# Patient Record
Sex: Male | Born: 1945 | Race: Black or African American | Hispanic: No | Marital: Married | Smoking: Former smoker
Health system: Southern US, Community
[De-identification: ages and names within clinical notes are randomized; demographics above are authoritative.]

## PROBLEM LIST (undated history)

## (undated) DIAGNOSIS — Z87438 Personal history of other diseases of male genital organs: Secondary | ICD-10-CM

## (undated) DIAGNOSIS — E785 Hyperlipidemia, unspecified: Secondary | ICD-10-CM

## (undated) DIAGNOSIS — C801 Malignant (primary) neoplasm, unspecified: Secondary | ICD-10-CM

## (undated) DIAGNOSIS — K219 Gastro-esophageal reflux disease without esophagitis: Secondary | ICD-10-CM

## (undated) DIAGNOSIS — I10 Essential (primary) hypertension: Secondary | ICD-10-CM

## (undated) DIAGNOSIS — N3281 Overactive bladder: Secondary | ICD-10-CM

## (undated) DIAGNOSIS — R7611 Nonspecific reaction to tuberculin skin test without active tuberculosis: Secondary | ICD-10-CM

## (undated) DIAGNOSIS — E876 Hypokalemia: Secondary | ICD-10-CM

## (undated) DIAGNOSIS — G4733 Obstructive sleep apnea (adult) (pediatric): Secondary | ICD-10-CM

## (undated) DIAGNOSIS — K5900 Constipation, unspecified: Secondary | ICD-10-CM

## (undated) DIAGNOSIS — E538 Deficiency of other specified B group vitamins: Secondary | ICD-10-CM

## (undated) DIAGNOSIS — N529 Male erectile dysfunction, unspecified: Secondary | ICD-10-CM

## (undated) HISTORY — DX: Nonspecific reaction to tuberculin skin test without active tuberculosis: R76.11

## (undated) HISTORY — DX: Obstructive sleep apnea (adult) (pediatric): G47.33

## (undated) HISTORY — DX: Personal history of other diseases of male genital organs: Z87.438

## (undated) HISTORY — DX: Constipation, unspecified: K59.00

## (undated) HISTORY — DX: Essential (primary) hypertension: I10

## (undated) HISTORY — DX: Male erectile dysfunction, unspecified: N52.9

## (undated) HISTORY — DX: Hyperlipidemia, unspecified: E78.5

## (undated) HISTORY — DX: Hypokalemia: E87.6

## (undated) HISTORY — DX: Gastro-esophageal reflux disease without esophagitis: K21.9

## (undated) HISTORY — DX: Overactive bladder: N32.81

## (undated) HISTORY — DX: Deficiency of other specified B group vitamins: E53.8

---

## 2005-01-26 HISTORY — PX: LAMINECTOMY AND MICRODISCECTOMY LUMBAR SPINE: SHX1913

## 2017-11-11 ENCOUNTER — Encounter: Payer: Self-pay | Admitting: Physical Therapy

## 2017-11-11 ENCOUNTER — Ambulatory Visit: Payer: No Typology Code available for payment source | Attending: Family Medicine | Admitting: Physical Therapy

## 2017-11-11 ENCOUNTER — Other Ambulatory Visit: Payer: Self-pay

## 2017-11-11 DIAGNOSIS — M21372 Foot drop, left foot: Secondary | ICD-10-CM | POA: Insufficient documentation

## 2017-11-11 DIAGNOSIS — R2689 Other abnormalities of gait and mobility: Secondary | ICD-10-CM | POA: Diagnosis present

## 2017-11-11 DIAGNOSIS — M25552 Pain in left hip: Secondary | ICD-10-CM | POA: Insufficient documentation

## 2017-11-11 DIAGNOSIS — M6281 Muscle weakness (generalized): Secondary | ICD-10-CM | POA: Insufficient documentation

## 2017-11-11 NOTE — Therapy (Signed)
[facility] [address] [place], [place], [address] Phone: [phone]   Fax:  [phone]  Physical Therapy Evaluation  Patient Details  Name: Grant Taylor MRN: [id number] Date of Birth: [birth year] Referring Provider (PT): [name]   Encounter Date: 11/11/2017  PT End of Session - 11/11/17 1117    Visit Number  1    Number of Visits  15    Date for PT Re-Evaluation  01/06/18    Authorization Type  [facility] approved 15 visits max    PT Start Time  4128    PT Stop Time  1215    PT Time Calculation (min)  57 min    Activity Tolerance  Patient tolerated treatment well       History reviewed. No pertinent past medical history.  History reviewed. No pertinent surgical history.  There were no vitals filed for this visit.   Subjective Assessment - 11/11/17 1115    Subjective  Orignial injury in [place].  Cervical myelopathy/spinal cord compression in spine C3-C7 causing LE weakness left > right.  12 years of knee jerking back causing hip, knee and toe pain.  Tried knee brace at the [place] that extended to the foot.  Tried different brace none of them worked.  Tried [employer] but all too painful and rubbed.  Tried 5 different braces.   All too uncomfortable.   Bought knee and ankle braces at [employer].   Using forearm crutch for the last 2 months since ankle kept rolling over.  Previously using a cane.  My knee doesn;t hurt it just doesn't work right.   Both of my hips hurt now.      Pertinent History  Cervical myelopathy with fusion; DDD; PTSD; HTN; LBP; left hip pain ;  Hammer toes contracted.      Limitations  Walking;Standing    How long can you sit comfortably?  no problem     How long can you stand comfortably?  10-15 min from myelopathy    How long can you walk comfortably?  25 yards     Diagnostic tests  arthritis    Patient Stated Goals  get rid of the cane and walk normally     Currently in Pain?  Yes    Pain Score  0-No pain    Pain Location  Hip    Pain Orientation  Left;Right    Pain Type  Chronic pain    Pain Frequency  Constant    Aggravating Factors   walking, standing;  have to use my hands to move leg in/out of the car;  difficulty pushing in clutch in car;  negotiating curb at his apartment complex         [facility] PT Assessment - 11/11/17 0001      Assessment   Medical Diagnosis  left knee pain    Referring Provider (PT)  [name]    Onset Date/Surgical Date  --   worsening in last few months   Next MD Visit  not scheduled     [facility]  [facility];  had acupunture in past no benefit;  has home TENS no benefit states he has burned himself on previous occassions      Precautions   Precautions  Fall      Restrictions   Weight Bearing Restrictions  No      Balance Screen   Has the patient fallen in the past 6 months  No  Has the patient had a decrease in activity level because of a fear of falling?   Yes    Is the patient reluctant to leave their home because of a fear of falling?   No      Home Environment   Living Environment  Private residence    Type of [place] - single point;Other (comment)    Additional Comments  forearm crutch    electric cart in the store     Prior Function   Vocation  Retired    Leisure  watching channels      Observation/Other Assessments   Focus on Therapeutic Outcomes (FOTO)   NA      AROM   Right Hip Extension  0    Right Hip Flexion  90    Right Hip ABduction  10    Left Hip Extension  0    Left Hip Flexion  80    Right Knee Extension  0    Right Knee Flexion  120    Left Knee Extension  0    Left Knee Flexion  87    Right Ankle Dorsiflexion  5    Right Ankle Plantar Flexion  30    Right Ankle Inversion  10    Right Ankle Eversion  10    Left Ankle Dorsiflexion  -10    Left Ankle Plantar Flexion  50    Left  Ankle Inversion  5    Left Ankle Eversion  0      Strength   Right Hip Flexion  4/5    Right Hip Extension  4/5    Right Hip External Rotation   4/5    Right Hip Internal Rotation  4/5    Right Hip ABduction  4-/5    Right Hip ADduction  4/5    Left Hip Flexion  3/5    Left Hip Extension  2/5    Left Hip External Rotation  3/5    Right Knee Flexion  4/5    Right Knee Extension  4/5    Left Knee Flexion  3+/5    Left Knee Extension  3+/5    Right Ankle Dorsiflexion  4/5    Right Ankle Plantar Flexion  4/5    Right Ankle Inversion  4/5    Right Ankle Eversion  4/5    Left Ankle Dorsiflexion  0/5    Left Ankle Plantar Flexion  2-/5    Left Ankle Inversion  2-/5    Left Ankle Eversion  0/5      Flexibility   Soft Tissue Assessment /Muscle Length  --   decreased left gastroc length      Ambulation/Gait   Assistive device  R Forearm Crutch    Gait Pattern  Trendelenburg;Lateral hip instability;Trunk flexed;Poor foot clearance - left    Gait Comments  patient has to flex left hip excessively high to clear left foot      6 Minute Walk- Baseline   6 Minute Walk- Baseline  --   to be assess next visit     Standardized Balance Assessment   Five times sit to stand comments   needs max assist with UEs to rise from standard chair       Timed Up and Go Test   TUG Comments  to be assessed next visit  Objective measurements completed on examination: See above findings.              PT Education - 11/11/17 1218    Education Details   Access Code: [email or web address]   pillow squeezes,sidelying clams; heel cord stretch     Person(s) Educated  Patient    Methods  Explanation;Handout    Comprehension  Verbalized understanding;Returned demonstration;Verbal cues required       PT Short Term Goals - 11/11/17 1307      PT SHORT TERM GOAL #1   Title  The patient will demonstrate compliance with intial home ex program     Time  4    Period  Weeks    Status   New    Target Date  12/09/17      PT SHORT TERM GOAL #2   Title  The patient will demonstrate good body mechanics with rolling to get in/out of bed to relieve back pain with this activity    Time  4    Period  Weeks    Status  New      PT SHORT TERM GOAL #3   Title  The patient will be able to walk 150 feet with most appropriate assistive device  needed for doctor's appointments and grocery shopping    Time  4    Period  Weeks    Status  New      PT SHORT TERM GOAL #4   Title  Left knee flexion ROM improved to 95 degrees needed for greater ease with sit to stand and negatiating curbs    Time  4    Period  Weeks    Status  New        PT Long Term Goals - 11/11/17 1316      PT LONG TERM GOAL #1   Title  The patient will be independent with safe self progression of HEP needed for further strengthening     Time  8    Period  Weeks    Status  New      PT LONG TERM GOAL #2   Title  The patient will report back and hip pain improved by 30% with usual ADLs    Time  8    Period  Weeks    Status  New      PT LONG TERM GOAL #3   Title  The patient will be able to ambulate with most appropriate assistive device 300 feet    Time  8    Period  Weeks    Status  New      PT LONG TERM GOAL #4   Title  The patient will have improved left hip strength to 4-/5 needed to lift his leg in/out of the car and for using the clutch     Time  8    Period  Weeks    Status  New      PT LONG TERM GOAL #5   Title  The patient will have improved knee strength to grossly 4/5 with greater ease with sit to stand and negotiating curbs    Time  8    Period  Weeks    Status  New      Additional Long Term Goals   Additional Long Term Goals  Yes      PT LONG TERM GOAL #6   Title  Timed up and go gait speed improved to 20 sec for safety with crossing parking lots  Plan - 11/11/17 1248    Clinical Impression Statement  The patient has a long history of cervical myelopathy  significant spinal cord compression from injury in the [place] War.  Twelve years ago he had 2 cervical surgeries.  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  He also reports a worsening of low back pain, posterior left hip pain and the new onset of right hip pain.  He reports he is limited to 10 min of standing and only 25 yards of walking at a time.  He began using a forearm crutch a few months ago after he repeatedly kept "rolling his ankle."  He previously used a straight cane.  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  He reports he has tried numerous braces (at least 5) at the [place] but "none of them worked."  He saw the podiatrist yesterday and recommended FES (functional electrical stimulation unit) to assist with foot drop.  He has trace activation of left ankle dorsiflexors and evertors.  Decreased left gastroc length on left with barely reaching neutral passively.  Weakness with left hip flexion, abduction and extension as well grossly 3-/5.  He uses his hands to lift left LE in/out of the car.  He needs signficant UE use with rising from a standard chair.   He has no current HEP.  He would benefit from PT to address these deficits.     History and Personal Factors relevant to plan of care:  lack of home support;  numerous co-morbidities including myelopathy;  DDD; arthritis;  PTSD;  HTN; spinal stenosis    Clinical Presentation  Evolving    Clinical Presentation due to:  multi regions and systems affected; worsening of symptoms    Clinical Decision Making  Moderate    Rehab Potential  Fair    PT Frequency  2x / week    PT Duration  8 weeks    PT Treatment/Interventions  ADLs/Self Care Home Management    PT Next Visit Plan  Nu-Step;  check 6 min walk test;  do TUG;  left gastroc stretch; try supine clam;  try whole leg press down;  try rolling walker; sit to stand from very  high surface;  left knee flexion ROM     PT Home Exercise Plan   Access Code: [email or web address]     Recommended Other Services  has home TENS unit    Consulted and Agree with Plan of Care  Patient       Patient will benefit from skilled therapeutic intervention in order to improve the following deficits and impairments:  Abnormal gait, Pain, Decreased activity tolerance, Decreased endurance, Decreased strength, Impaired perceived functional ability, Difficulty walking, Decreased balance, Decreased range of motion  Visit Diagnosis: Muscle weakness (generalized) - Plan: PT plan of care cert/re-cert  Pain in left hip - Plan: PT plan of care cert/re-cert  Foot drop, left - Plan: PT plan of care cert/re-cert  Other abnormalities of gait and mobility - Plan: PT plan of care cert/re-cert     Problem List There are no active problems to display for this patient.  [name], PT 11/11/17 1:29 PM Phone: [phone] Fax: [phone]  [name] 11/11/2017, 1:29 PM  [facility] [address], [place], [place], [address] Phone: [phone]   Fax:  [phone]  Name: Grant Rinks  Taylor MRN: [id number] Date of Birth: 05/10/1945

## 2017-11-11 NOTE — Patient Instructions (Signed)
Access Code: [email or web address]  URL: [email or web address]  Date: 11/11/2017  Prepared by: [name]   Exercises  Seated Gastroc Stretch with Strap - 3 reps - 1 sets - 30 hold - 5x daily - 7x weekly  Supine Isometric Hip Adduction with Pillow at Knees - 10 reps - 1 sets - 1x daily - 7x weekly  Clamshell - 10 reps - 1 sets - 1x daily - 7x weekly

## 2017-11-18 ENCOUNTER — Ambulatory Visit: Payer: No Typology Code available for payment source | Admitting: Physical Therapy

## 2017-11-22 ENCOUNTER — Encounter: Payer: Self-pay | Admitting: Physical Therapy

## 2017-11-22 ENCOUNTER — Ambulatory Visit: Payer: No Typology Code available for payment source | Admitting: Physical Therapy

## 2017-11-22 DIAGNOSIS — M6281 Muscle weakness (generalized): Secondary | ICD-10-CM | POA: Diagnosis not present

## 2017-11-22 DIAGNOSIS — M25552 Pain in left hip: Secondary | ICD-10-CM

## 2017-11-22 DIAGNOSIS — M21372 Foot drop, left foot: Secondary | ICD-10-CM

## 2017-11-22 DIAGNOSIS — R2689 Other abnormalities of gait and mobility: Secondary | ICD-10-CM

## 2017-11-22 NOTE — Therapy (Signed)
[facility] [address], [place], [place], [address] Phone: [phone]   Fax:  [phone]  Physical Therapy Treatment  Patient Details  Name: Grant Taylor MRN: [id number] Date of Birth: Jun 29, 1945 Referring Provider (PT): [name]   Encounter Date: 11/22/2017  PT End of Session - 11/22/17 1351    Visit Number  2    Number of Visits  15    Date for PT Re-Evaluation  01/06/18    Authorization Type  [facility] approved 15 visits max    PT Start Time  2694    PT Stop Time  8546    PT Time Calculation (min)  44 min    Activity Tolerance  Patient tolerated treatment well;No increased pain    Behavior During Therapy  [facility] for tasks assessed/performed       History reviewed. No pertinent past medical history.  History reviewed. No pertinent surgical history.  There were no vitals filed for this visit.  Subjective Assessment - 11/22/17 1236    Subjective  Pt reports that things are going ok. No change since his evaluation.     Pertinent History  Cervical myelopathy with fusion; DDD; PTSD; HTN; LBP; left hip pain ;  Hammer toes contracted.      Limitations  Walking;Standing    How long can you sit comfortably?  no problem     How long can you stand comfortably?  10-15 min from myelopathy    How long can you walk comfortably?  25 yards     Diagnostic tests  arthritis    Patient Stated Goals  get rid of the cane and walk normally     Currently in Pain?  No/denies         [facility] PT Assessment - 11/22/17 0001      6 Minute Walk- Baseline   6 Minute Walk- Baseline  --      6 minute walk test results    Endurance additional comments  182 with 1 forearm crutch                   [facility] Adult PT Treatment/Exercise - 11/22/17 0001      Exercises   Exercises  Knee/Hip      Knee/Hip Exercises: Stretches   Gastroc Stretch  Left;4 reps;20 seconds    Gastroc Stretch Limitations  long sitting with  strap      Knee/Hip Exercises: Aerobic   Nustep  L1 x91min, PT present to discuss orthotics implications       Knee/Hip Exercises: Supine   Other Supine Knee/Hip Exercises  B bent knee fallout x10 reps     Other Supine Knee/Hip Exercises  Lt and Rt hip extension isometric 10x5 sec hold              PT Education - 11/22/17 1350    Education Details  implications for orthotics referral; technique with therex    Person(s) Educated  Patient    Methods  Explanation;Verbal cues    Comprehension  Returned demonstration;Verbalized understanding       PT Short Term Goals - 11/11/17 1307      PT SHORT TERM GOAL #1   Title  The patient will demonstrate compliance with intial home ex program     Time  4    Period  Weeks    Status  New    Target Date  12/09/17      PT SHORT TERM GOAL #2  Title  The patient will demonstrate good body mechanics with rolling to get in/out of bed to relieve back pain with this activity    Time  4    Period  Weeks    Status  New      PT SHORT TERM GOAL #3   Title  The patient will be able to walk 150 feet with most appropriate assistive device  needed for doctor's appointments and grocery shopping    Time  4    Period  Weeks    Status  New      PT SHORT TERM GOAL #4   Title  Left knee flexion ROM improved to 95 degrees needed for greater ease with sit to stand and negatiating curbs    Time  4    Period  Weeks    Status  New        PT Long Term Goals - 11/11/17 1316      PT LONG TERM GOAL #1   Title  The patient will be independent with safe self progression of HEP needed for further strengthening     Time  8    Period  Weeks    Status  New      PT LONG TERM GOAL #2   Title  The patient will report back and hip pain improved by 30% with usual ADLs    Time  8    Period  Weeks    Status  New      PT LONG TERM GOAL #3   Title  The patient will be able to ambulate with most appropriate assistive device 300 feet    Time  8    Period   Weeks    Status  New      PT LONG TERM GOAL #4   Title  The patient will have improved left hip strength to 4-/5 needed to lift his leg in/out of the car and for using the clutch     Time  8    Period  Weeks    Status  New      PT LONG TERM GOAL #5   Title  The patient will have improved knee strength to grossly 4/5 with greater ease with sit to stand and negotiating curbs    Time  8    Period  Weeks    Status  New      Additional Long Term Goals   Additional Long Term Goals  Yes      PT LONG TERM GOAL #6   Title  Timed up and go gait speed improved to 20 sec for safety with crossing parking lots            Plan - 11/22/17 1351    Clinical Impression Statement  Pt's 6 MWT was completed this session due to time constraints during his evaluation. He was able to ambulate 182 feet with his AD and without the need for a rest break. Session focused on therex to improve hip mobility, increase ankle DF ROM and active hip/knee musculature for better control during ambulation. Due to pt's excessive foot drop and genu recurvatum, therapist discussed implications for an orthotics referral. Pt had understanding of this and reported no increase in pain with any exercises/activities completed during today's session.     Rehab Potential  Fair    PT Frequency  2x / week    PT Duration  8 weeks    PT Treatment/Interventions  ADLs/Self Care Home  Management    PT Next Visit Plan  f/u on KAFO/AFO; Nu-Step for endurance as needed; ankle mobilization and hip stretching; try supine clam;  try whole leg press down;  try rolling walker; sit to stand from very high surface;  left knee flexion ROM     PT Home Exercise Plan   Access Code: [email or web address]     Consulted and Agree with Plan of Care  Patient       Patient will benefit from skilled therapeutic intervention in order to improve the following deficits and impairments:  Abnormal gait, Pain, Decreased activity tolerance, Decreased endurance, Decreased  strength, Impaired perceived functional ability, Difficulty walking, Decreased balance, Decreased range of motion  Visit Diagnosis: Muscle weakness (generalized)  Pain in left hip  Foot drop, left  Other abnormalities of gait and mobility     Problem List There are no active problems to display for this patient.   1:57 PM,11/22/17 [place] [place] [facility] [place]  [facility] [address], [place], [place], [address] Phone: [phone]   Fax:  [phone]  Name: Grant Taylor MRN: [id number] Date of Birth: [birth year]

## 2017-11-25 ENCOUNTER — Encounter: Payer: Self-pay | Admitting: Physical Therapy

## 2017-11-25 ENCOUNTER — Ambulatory Visit: Payer: No Typology Code available for payment source | Admitting: Physical Therapy

## 2017-11-25 DIAGNOSIS — R2689 Other abnormalities of gait and mobility: Secondary | ICD-10-CM

## 2017-11-25 DIAGNOSIS — M21372 Foot drop, left foot: Secondary | ICD-10-CM

## 2017-11-25 DIAGNOSIS — M6281 Muscle weakness (generalized): Secondary | ICD-10-CM

## 2017-11-25 DIAGNOSIS — M25552 Pain in left hip: Secondary | ICD-10-CM

## 2017-11-25 NOTE — Patient Instructions (Signed)
Access Code: [email or web address]  URL: [email or web address]  Date: 11/25/2017  Prepared by: Sherol Dade   Exercises  Seated Gastroc Stretch with Strap - 3 reps - 1 sets - 30 hold - 5x daily - 7x weekly  Supine Isometric Hip Adduction with Pillow at Knees - 10 reps - 1 sets - 1x daily - 7x weekly  Clamshell - 10 reps - 1 sets - 1x daily - 7x weekly  Supine Hamstring Stretch with Strap - 10 reps - 3 sets - 1x daily - 7x weekly    [facility] [address], [place], [place] [address] Phone # [phone] Fax [phone]

## 2017-11-25 NOTE — Therapy (Signed)
[facility] [address], [place], [place], [address] Phone: [phone]   Fax:  [phone]  Physical Therapy Treatment  Patient Details  Name: Grant Taylor MRN: [id number] Date of Birth: 08/30/1945 Referring Provider (PT): [name]   Encounter Date: 11/25/2017  PT End of Session - 11/25/17 1448    Visit Number  3    Number of Visits  15    Date for PT Re-Evaluation  01/06/18    Authorization Type  [facility] approved 15 visits max    PT Start Time  9528    PT Stop Time  4132    PT Time Calculation (min)  42 min    Activity Tolerance  Patient tolerated treatment well;No increased pain    Behavior During Therapy  [facility] for tasks assessed/performed       History reviewed. No pertinent past medical history.  History reviewed. No pertinent surgical history.  There were no vitals filed for this visit.  Subjective Assessment - 11/25/17 1408    Subjective  Pt reports that he ordered a stretch strap. His Rt hip is bothering him now because he tried walking to mailbox.    Pertinent History  Cervical myelopathy with fusion; DDD; PTSD; HTN; LBP; left hip pain ;  Hammer toes contracted.      Limitations  Walking;Standing    How long can you sit comfortably?  no problem     How long can you stand comfortably?  10-15 min from myelopathy    How long can you walk comfortably?  25 yards     Diagnostic tests  arthritis    Patient Stated Goals  get rid of the cane and walk normally     Currently in Pain?  Yes    Pain Score  2     Pain Location  Hip    Pain Orientation  Right    Pain Descriptors / Indicators  Aching    Pain Type  Chronic pain    Pain Radiating Towards  none noted     Pain Onset  More than a month ago    Pain Frequency  Constant    Aggravating Factors   alot of weight bearing     Pain Relieving Factors  resting helps     Effect of Pain on Daily Activities  limited walking                         [facility] Adult PT Treatment/Exercise - 11/25/17 0001      Exercises   Exercises  Knee/Hip      Knee/Hip Exercises: Stretches   Passive Hamstring Stretch  Left;3 reps;20 seconds    Passive Hamstring Stretch Limitations  active assisted straight leg raise     Gastroc Stretch  Left;4 reps;20 seconds    Gastroc Stretch Limitations  long sitting with strap    Other Knee/Hip Stretches  Lt ankle eversion stretch 5x20 sec hold       Manual Therapy   Manual Therapy  Myofascial release    Myofascial Release  Lt talocrural AP mobilization grade IV x4 bouts, Grade III-IV medial/lateral subtalar joint mobilization x2 bouts; Lt subtalar distraction x4 bouts              PT Education - 11/25/17 1447    Education Details  technique with therex; updated and reviewed HEP    Person(s) Educated  Patient    Methods  Explanation;Handout;Verbal cues;Demonstration  Comprehension  Verbalized understanding;Returned demonstration       PT Short Term Goals - 11/11/17 1307      PT SHORT TERM GOAL #1   Title  The patient will demonstrate compliance with intial home ex program     Time  4    Period  Weeks    Status  New    Target Date  12/09/17      PT SHORT TERM GOAL #2   Title  The patient will demonstrate good body mechanics with rolling to get in/out of bed to relieve back pain with this activity    Time  4    Period  Weeks    Status  New      PT SHORT TERM GOAL #3   Title  The patient will be able to walk 150 feet with most appropriate assistive device  needed for doctor's appointments and grocery shopping    Time  4    Period  Weeks    Status  New      PT SHORT TERM GOAL #4   Title  Left knee flexion ROM improved to 95 degrees needed for greater ease with sit to stand and negatiating curbs    Time  4    Period  Weeks    Status  New        PT Long Term Goals - 11/11/17 1316      PT LONG TERM GOAL #1   Title  The patient will be independent  with safe self progression of HEP needed for further strengthening     Time  8    Period  Weeks    Status  New      PT LONG TERM GOAL #2   Title  The patient will report back and hip pain improved by 30% with usual ADLs    Time  8    Period  Weeks    Status  New      PT LONG TERM GOAL #3   Title  The patient will be able to ambulate with most appropriate assistive device 300 feet    Time  8    Period  Weeks    Status  New      PT LONG TERM GOAL #4   Title  The patient will have improved left hip strength to 4-/5 needed to lift his leg in/out of the car and for using the clutch     Time  8    Period  Weeks    Status  New      PT LONG TERM GOAL #5   Title  The patient will have improved knee strength to grossly 4/5 with greater ease with sit to stand and negotiating curbs    Time  8    Period  Weeks    Status  New      Additional Long Term Goals   Additional Long Term Goals  Yes      PT LONG TERM GOAL #6   Title  Timed up and go gait speed improved to 20 sec for safety with crossing parking lots            Plan - 11/25/17 1449    Clinical Impression Statement  Pt arrived with his toe support and therapist reviewed this. It appears to have proper fit, however pt has been wearing this 24/7 and it appears to have irritated between his toes. Therapist educated him on the importance of removing the  toe pad throughout the day to prevent excess moisture and skin break down. Also focused on manual treatment to the Lt ankle for improved flexibility and reinforced pt technique with his stretches for home. Pt was able to demonstrate good understanding of HEP updates by the end of today's session.     Rehab Potential  Fair    PT Frequency  2x / week    PT Duration  8 weeks    PT Treatment/Interventions  ADLs/Self Care Home Management    PT Next Visit Plan  f/u on KAFO/AFO if information is provided; Nu-Step for endurance as needed; ankle mobilization and hip stretching; try supine  clam;  try whole leg press down;  try rolling walker; sit to stand from very high surface;  left knee flexion ROM     PT Home Exercise Plan   Access Code: [email or web address]     Consulted and Agree with Plan of Care  Patient       Patient will benefit from skilled therapeutic intervention in order to improve the following deficits and impairments:  Abnormal gait, Pain, Decreased activity tolerance, Decreased endurance, Decreased strength, Impaired perceived functional ability, Difficulty walking, Decreased balance, Decreased range of motion  Visit Diagnosis: Muscle weakness (generalized)  Pain in left hip  Foot drop, left  Other abnormalities of gait and mobility     Problem List There are no active problems to display for this patient.   3:12 PM,11/25/17 Sherol Dade PT, DPT [place] [facility] [address] [place], [place], [address] Phone: [phone]   Fax:  [phone]  Name: Grant Taylor MRN: [id number] Date of Birth: [birth year]

## 2017-11-30 ENCOUNTER — Encounter: Payer: Self-pay | Admitting: Physical Therapy

## 2017-11-30 ENCOUNTER — Ambulatory Visit: Payer: No Typology Code available for payment source | Attending: Family Medicine | Admitting: Physical Therapy

## 2017-11-30 DIAGNOSIS — M6281 Muscle weakness (generalized): Secondary | ICD-10-CM | POA: Diagnosis present

## 2017-11-30 DIAGNOSIS — R2689 Other abnormalities of gait and mobility: Secondary | ICD-10-CM | POA: Insufficient documentation

## 2017-11-30 DIAGNOSIS — M25552 Pain in left hip: Secondary | ICD-10-CM | POA: Insufficient documentation

## 2017-11-30 DIAGNOSIS — M21372 Foot drop, left foot: Secondary | ICD-10-CM | POA: Insufficient documentation

## 2017-11-30 NOTE — Therapy (Signed)
[facility] [address], [place], [place], [address] Phone: [phone]   Fax:  [phone]  Physical Therapy Treatment  Patient Details  Name: Grant Taylor MRN: [id number] Date of Birth: 15-Jun-1945 Referring Provider (PT): [name]   Encounter Date: 11/30/2017  PT End of Session - 11/30/17 1355    Visit Number  4    Number of Visits  15    Date for PT Re-Evaluation  01/06/18    Authorization Type  VA approved 15 visits max    PT Start Time  1230    PT Stop Time  1312    PT Time Calculation (min)  42 min    Activity Tolerance  Patient tolerated treatment well;No increased pain       History reviewed. No pertinent past medical history.  History reviewed. No pertinent surgical history.  There were no vitals filed for this visit.  Subjective Assessment - 11/30/17 1307    Subjective  Hurts across my back and hip when I stand.  Hard to bring leg in/out of the car.      Pertinent History  Cervical myelopathy with fusion; DDD; PTSD; HTN; LBP; left hip pain ;  Hammer toes contracted.      Currently in Pain?  No/denies    Pain Score  0-No pain    Pain Relieving Factors  sitting down                        [facility] Adult PT Treatment/Exercise - 11/30/17 0001      Knee/Hip Exercises: Aerobic   Nustep  seat 10 L2 6 min       Knee/Hip Exercises: Standing   Other Standing Knee Exercises  gait with green band assist for dorsiflexion 40 feet       Knee/Hip Exercises: Seated   Long Arc Quad  Left;15 reps    Clamshell with TheraBand  Red   30x   Other Seated Knee/Hip Exercises  hip flexion left 15x     Other Seated Knee/Hip Exercises  floor slider 15x knee flexion      Knee/Hip Exercises: Supine   Other Supine Knee/Hip Exercises  hip internal rotation and external rotation 5x 5 sec holds      Knee/Hip Exercises: Sidelying   Other Sidelying Knee/Hip Exercises  hip flexion 10x      Manual  Therapy   Manual therapy comments  hip knee flexion 10x with light manual resist 10x    Myofascial Release  Lt talocrural AP mobilization grade IV x4 bouts, Grade III-IV medial/lateral subtalar joint mobilization x2 bouts; Lt subtalar distraction x4 bouts       Ankle Exercises: Seated   Other Seated Ankle Exercises  rocker board double and single leg 20x each                PT Short Term Goals - 11/11/17 1307      PT SHORT TERM GOAL #1   Title  The patient will demonstrate compliance with intial home ex program     Time  4    Period  Weeks    Status  New    Target Date  12/09/17      PT SHORT TERM GOAL #2   Title  The patient will demonstrate good body mechanics with rolling to get in/out of bed to relieve back pain with this activity    Time  4    Period  Weeks  Status  New      PT SHORT TERM GOAL #3   Title  The patient will be able to walk 150 feet with most appropriate assistive device  needed for doctor's appointments and grocery shopping    Time  4    Period  Weeks    Status  New      PT SHORT TERM GOAL #4   Title  Left knee flexion ROM improved to 95 degrees needed for greater ease with sit to stand and negatiating curbs    Time  4    Period  Weeks    Status  New        PT Long Term Goals - 11/11/17 1316      PT LONG TERM GOAL #1   Title  The patient will be independent with safe self progression of HEP needed for further strengthening     Time  8    Period  Weeks    Status  New      PT LONG TERM GOAL #2   Title  The patient will report back and hip pain improved by 30% with usual ADLs    Time  8    Period  Weeks    Status  New      PT LONG TERM GOAL #3   Title  The patient will be able to ambulate with most appropriate assistive device 300 feet    Time  8    Period  Weeks    Status  New      PT LONG TERM GOAL #4   Title  The patient will have improved left hip strength to 4-/5 needed to lift his leg in/out of the car and for using the  clutch     Time  8    Period  Weeks    Status  New      PT LONG TERM GOAL #5   Title  The patient will have improved knee strength to grossly 4/5 with greater ease with sit to stand and negotiating curbs    Time  8    Period  Weeks    Status  New      Additional Long Term Goals   Additional Long Term Goals  Yes      PT LONG TERM GOAL #6   Title  Timed up and go gait speed improved to 20 sec for safety with crossing parking lots            Plan - 11/30/17 1356    Clinical Impression Statement  [facility] and [facility] recommendation for fixed KAFO.  Added theraband for ankle dorsiflexion assist and to limit genu recurvatum however motor weakness is so profound that this was not effective.  He is able to perform low level LE strengthening and ankle mobility exercises without exacerbation of pain.  Therapist closely monitoring response with all treatment interventions.  The patient declines instruction in use of a  RW at this time.      Rehab Potential  Fair    PT Frequency  2x / week    PT Duration  8 weeks    PT Next Visit Plan  f/u on KAFO/AFO if information is provided; Nu-Step; ankle mobilization and hip stretching; try supine clam;  try whole leg press down;sit to stand from very high surface;  left knee flexion ROM     PT Home Exercise Plan   Access Code: [email or web address]  Patient will benefit from skilled therapeutic intervention in order to improve the following deficits and impairments:  Abnormal gait, Pain, Decreased activity tolerance, Decreased endurance, Decreased strength, Impaired perceived functional ability, Difficulty walking, Decreased balance, Decreased range of motion  Visit Diagnosis: Muscle weakness (generalized)  Pain in left hip  Foot drop, left  Other abnormalities of gait and mobility     Problem List There are no active problems to display for this patient.  [name], PT 11/30/17 4:39 PM Phone:  [phone] Fax: [phone]  [name] 11/30/2017, 4:39 PM  [facility] [address] [place], [place], [address] Phone: [phone]   Fax:  [phone]  Name: Grant Taylor MRN: [id number] Date of Birth: [birth year]

## 2017-12-02 ENCOUNTER — Encounter: Payer: Self-pay | Admitting: Physical Therapy

## 2017-12-02 ENCOUNTER — Ambulatory Visit: Payer: No Typology Code available for payment source | Admitting: Physical Therapy

## 2017-12-02 DIAGNOSIS — M21372 Foot drop, left foot: Secondary | ICD-10-CM

## 2017-12-02 DIAGNOSIS — M6281 Muscle weakness (generalized): Secondary | ICD-10-CM | POA: Diagnosis not present

## 2017-12-02 DIAGNOSIS — M25552 Pain in left hip: Secondary | ICD-10-CM

## 2017-12-02 DIAGNOSIS — R2689 Other abnormalities of gait and mobility: Secondary | ICD-10-CM

## 2017-12-02 NOTE — Therapy (Signed)
[facility] [address], [place], [place], [address] Phone: [phone]   Fax:  [phone]  Physical Therapy Treatment  Patient Details  Name: Grant Taylor MRN: [id number] Date of Birth: 01-Aug-1945 Referring Provider (PT): [name]   Encounter Date: 12/02/2017  PT End of Session - 12/02/17 1532    Visit Number  5    Number of Visits  15    Date for PT Re-Evaluation  01/06/18    Authorization Type  [facility] approved 15 visits max    PT Start Time  5462    PT Stop Time  7035    PT Time Calculation (min)  42 min    Activity Tolerance  Patient tolerated treatment well;No increased pain       History reviewed. No pertinent past medical history.  History reviewed. No pertinent surgical history.  There were no vitals filed for this visit.  Subjective Assessment - 12/02/17 1243    Subjective  Right hip started hurting.  My neck hurts today.  I took a muscle relaxer on Tuesday and I slept most of the day yesterday.  I had to walk a long ways to the car.      Pertinent History  Cervical myelopathy with fusion; DDD; PTSD; HTN; LBP; left hip pain ;  Hammer toes contracted.      Currently in Pain?  Yes    Pain Score  7     Pain Location  Neck    Multiple Pain Sites  Yes    Pain Score  7    Pain Location  Hip    Pain Orientation  Left                       [facility] Adult PT Treatment/Exercise - 12/02/17 0001      Knee/Hip Exercises: Aerobic   Nustep  seat 11 L3 12 min    while discussing progress, [facility] recommendation for KAFO      Knee/Hip Exercises: Seated   Long Arc Quad  Strengthening;Right;Left;10 reps    Other Seated Knee/Hip Exercises  marching with red band       Knee/Hip Exercises: Supine   Other Supine Knee/Hip Exercises  ball squeeze 10x    Other Supine Knee/Hip Exercises  red band clams single and double       Knee/Hip Exercises: Sidelying   Clams  red band 10x    Other Sidelying  Knee/Hip Exercises  hip flexion 10x      Manual Therapy   Manual therapy comments  hip knee flexion 10x with light manual resist 10x    Myofascial Release  Lt talocrural AP mobilization grade IV x4 bouts, Grade III-IV medial/lateral subtalar joint mobilization x2 bouts; Lt subtalar distraction x4 bouts       Ankle Exercises: Seated   Other Seated Ankle Exercises  rocker board double and single leg 20x each                PT Short Term Goals - 11/11/17 1307      PT SHORT TERM GOAL #1   Title  The patient will demonstrate compliance with intial home ex program     Time  4    Period  Weeks    Status  New    Target Date  12/09/17      PT SHORT TERM GOAL #2   Title  The patient will demonstrate good body mechanics with rolling to get in/out  of bed to relieve back pain with this activity    Time  4    Period  Weeks    Status  New      PT SHORT TERM GOAL #3   Title  The patient will be able to walk 150 feet with most appropriate assistive device  needed for doctor's appointments and grocery shopping    Time  4    Period  Weeks    Status  New      PT SHORT TERM GOAL #4   Title  Left knee flexion ROM improved to 95 degrees needed for greater ease with sit to stand and negatiating curbs    Time  4    Period  Weeks    Status  New        PT Long Term Goals - 11/11/17 1316      PT LONG TERM GOAL #1   Title  The patient will be independent with safe self progression of HEP needed for further strengthening     Time  8    Period  Weeks    Status  New      PT LONG TERM GOAL #2   Title  The patient will report back and hip pain improved by 30% with usual ADLs    Time  8    Period  Weeks    Status  New      PT LONG TERM GOAL #3   Title  The patient will be able to ambulate with most appropriate assistive device 300 feet    Time  8    Period  Weeks    Status  New      PT LONG TERM GOAL #4   Title  The patient will have improved left hip strength to 4-/5 needed to  lift his leg in/out of the car and for using the clutch     Time  8    Period  Weeks    Status  New      PT LONG TERM GOAL #5   Title  The patient will have improved knee strength to grossly 4/5 with greater ease with sit to stand and negotiating curbs    Time  8    Period  Weeks    Status  New      Additional Long Term Goals   Additional Long Term Goals  Yes      PT LONG TERM GOAL #6   Title  Timed up and go gait speed improved to 20 sec for safety with crossing parking lots            Plan - 12/02/17 1532    Clinical Impression Statement  Sherol Dade PT spoke with [facility] care coordinator, Pamala Hurry, regarding our recommendation for a KAFO custom brace.  We feel his hip pain will not improve without correction for foot drop and knee recurvatum which in turn cause overuse of hip flexors.  Gastroc muscle length gradually improving and is near neutral 0 degrees.  Therapist closely monitoring response with all exercises.  No change in hip pain reported.      Rehab Potential  Fair    PT Frequency  2x / week    PT Duration  8 weeks    PT Treatment/Interventions  ADLs/Self Care Home Management;Therapeutic exercise;Therapeutic activities;Neuromuscular re-education;Patient/family education;Manual techniques;Taping    PT Next Visit Plan  check progress toward STGs;  f/u on KAFO/AFO if information is provided; Nu-Step; ankle mobilization and hip  stretching; try supine clam;  try whole leg press down;sit to stand from very high surface;  left knee flexion ROM     PT Home Exercise Plan   Access Code: [email or web address]        Patient will benefit from skilled therapeutic intervention in order to improve the following deficits and impairments:  Abnormal gait, Pain, Decreased activity tolerance, Decreased endurance, Decreased strength, Impaired perceived functional ability, Difficulty walking, Decreased balance, Decreased range of motion  Visit Diagnosis: Muscle weakness (generalized)  Pain in left  hip  Foot drop, left  Other abnormalities of gait and mobility     Problem List There are no active problems to display for this patient.  [name], PT 12/02/17 5:15 PM Phone: [phone] Fax: [phone]  [name] 12/02/2017, 5:15 PM  [facility] [address], [place], [place], [address] Phone: [phone]   Fax:  [phone]  Name: Naif Alabi MRN: [id number] Date of Birth: 06-04-1945

## 2017-12-07 ENCOUNTER — Ambulatory Visit: Payer: No Typology Code available for payment source | Admitting: Physical Therapy

## 2017-12-07 DIAGNOSIS — R2689 Other abnormalities of gait and mobility: Secondary | ICD-10-CM

## 2017-12-07 DIAGNOSIS — M21372 Foot drop, left foot: Secondary | ICD-10-CM

## 2017-12-07 DIAGNOSIS — M25552 Pain in left hip: Secondary | ICD-10-CM

## 2017-12-07 DIAGNOSIS — M6281 Muscle weakness (generalized): Secondary | ICD-10-CM

## 2017-12-07 NOTE — Therapy (Signed)
[facility] [address] [place], [place], [address] Phone: [phone]   Fax:  [phone]  Physical Therapy Treatment  Patient Details  Name: Grant Taylor MRN: [id number] Date of Birth: 11-06-1945 Referring Provider (PT): [name]   Encounter Date: 12/07/2017  PT End of Session - 12/07/17 1310    Visit Number  6    Number of Visits  15    Date for PT Re-Evaluation  01/06/18    Authorization Type  VA approved 15 visits max    PT Start Time  1230    PT Stop Time  1315    PT Time Calculation (min)  45 min    Activity Tolerance  Patient tolerated treatment well       No past medical history on file.  No past surgical history on file.  There were no vitals filed for this visit.  Subjective Assessment - 12/07/17 1311    Subjective  Yesterday my back really hurt.  I couldn't stand up straight.  Both my right and left hip hurt.      Pertinent History  Cervical myelopathy with fusion; DDD; PTSD; HTN; LBP; left hip pain ;  Hammer toes contracted.      Currently in Pain?  Yes    Pain Score  7     Pain Location  Back    Pain Orientation  Right;Left    Aggravating Factors   standing, walking    Pain Relieving Factors  sitting or lying down                       [facility] Adult PT Treatment/Exercise - 12/07/17 0001      Lumbar Exercises: Supine   Other Supine Lumbar Exercises  hip flexion isometric 10x right/left      Knee/Hip Exercises: Aerobic   Nustep  seat 11 L4 10 min    while discussing progress     Knee/Hip Exercises: Seated   Other Seated Knee/Hip Exercises  floor slider 15x knee flexion with red band flexion and extension       Knee/Hip Exercises: Supine   Other Supine Knee/Hip Exercises  ball squeeze 10x    Other Supine Knee/Hip Exercises  red band clams single and double       Manual Therapy   Manual therapy comments  hip knee flexion 10x with light manual resist 10x     Myofascial Release  Lt talocrural AP mobilization grade IV x4 bouts, Grade III-IV medial/lateral subtalar joint mobilization x2 bouts; Lt subtalar distraction x4 bouts     Passive ROM  passive right/left hip rotation and abduction 10x each       Ankle Exercises: Seated   Other Seated Ankle Exercises  rocker board double and single leg 20x each                PT Short Term Goals - 12/07/17 1316      PT SHORT TERM GOAL #1   Title  The patient will demonstrate compliance with intial home ex program     Status  Achieved      PT SHORT TERM GOAL #2   Title  The patient will demonstrate good body mechanics with rolling to get in/out of bed to relieve back pain with this activity    Status  Achieved      PT SHORT TERM GOAL #3   Title  The patient will be able to walk 150 feet with  most appropriate assistive device  needed for doctor's appointments and grocery shopping    Time  4    Period  Weeks    Status  On-going      PT SHORT TERM GOAL #4   Title  Left knee flexion ROM improved to 95 degrees needed for greater ease with sit to stand and negatiating curbs    Time  4    Period  Weeks    Status  On-going        PT Long Term Goals - 11/11/17 1316      PT LONG TERM GOAL #1   Title  The patient will be independent with safe self progression of HEP needed for further strengthening     Time  8    Period  Weeks    Status  New      PT LONG TERM GOAL #2   Title  The patient will report back and hip pain improved by 30% with usual ADLs    Time  8    Period  Weeks    Status  New      PT LONG TERM GOAL #3   Title  The patient will be able to ambulate with most appropriate assistive device 300 feet    Time  8    Period  Weeks    Status  New      PT LONG TERM GOAL #4   Title  The patient will have improved left hip strength to 4-/5 needed to lift his leg in/out of the car and for using the clutch     Time  8    Period  Weeks    Status  New      PT LONG TERM GOAL #5    Title  The patient will have improved knee strength to grossly 4/5 with greater ease with sit to stand and negotiating curbs    Time  8    Period  Weeks    Status  New      Additional Long Term Goals   Additional Long Term Goals  Yes      PT LONG TERM GOAL #6   Title  Timed up and go gait speed improved to 20 sec for safety with crossing parking lots            Plan - 12/07/17 1310    Clinical Impression Statement  Called and spoke with [facility] [place] coordinator) regarding KAFO custom brace to address foot drop and genu recurvatum.  The patient has improved gastroc and HS length.  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  He is highly motivated to increased resistance and repetition for greater benefit.  Therapist closely monitoring response with all interventions.      History and Personal Factors relevant to plan of care:  --    Rehab Potential  Fair    PT Frequency  2x / week    PT Duration  8 weeks    PT Treatment/Interventions  ADLs/Self Care Home Management;Therapeutic exercise;Therapeutic activities;Neuromuscular re-education;Patient/family education;Manual techniques;Taping    PT Next Visit Plan  check progress toward STGs; check hip flexion ROM for STG;   f/u on KAFO/AFO; Nu-Step; ankle mobilization and hip stretching; try supine clam;  try whole leg press down;sit to stand from very high surface;  left knee flexion ROM     PT Home Exercise Plan   Access Code: [email or web address]  Patient will benefit from skilled therapeutic intervention in order to improve the following deficits and impairments:  Abnormal gait, Pain, Decreased activity tolerance, Decreased endurance, Decreased strength, Impaired perceived functional ability, Difficulty walking, Decreased balance, Decreased range of motion  Visit Diagnosis: Muscle weakness (generalized)  Pain in left hip  Foot drop, left  Other abnormalities of gait and  mobility     Problem List There are no active problems to display for this patient.  [name], PT 12/07/17 8:50 PM Phone: [phone] Fax: [phone]  [name] 12/07/2017, 8:49 PM  [facility] [address] [place], [place], [address] Phone: [phone]   Fax:  [phone]  Name: Grant Taylor MRN: [id number] Date of Birth: 1945-08-01

## 2017-12-09 ENCOUNTER — Ambulatory Visit: Payer: No Typology Code available for payment source | Admitting: Physical Therapy

## 2017-12-14 ENCOUNTER — Ambulatory Visit: Payer: No Typology Code available for payment source | Admitting: Physical Therapy

## 2017-12-14 DIAGNOSIS — R2689 Other abnormalities of gait and mobility: Secondary | ICD-10-CM

## 2017-12-14 DIAGNOSIS — M6281 Muscle weakness (generalized): Secondary | ICD-10-CM | POA: Diagnosis not present

## 2017-12-14 DIAGNOSIS — M25552 Pain in left hip: Secondary | ICD-10-CM

## 2017-12-14 DIAGNOSIS — M21372 Foot drop, left foot: Secondary | ICD-10-CM

## 2017-12-14 NOTE — Therapy (Signed)
[facility] [address], [place], [place], [address] Phone: [phone]   Fax:  [phone]  Physical Therapy Treatment  Patient Details  Name: Grant Taylor MRN: [id number] Date of Birth: 05/07/1945 Referring Provider (PT): [name]   Encounter Date: 12/14/2017  PT End of Session - 12/14/17 1710    Visit Number  7    Number of Visits  15    Date for PT Re-Evaluation  01/06/18    Authorization Type  VA approved 15 visits max    PT Start Time  3976    PT Stop Time  1443    PT Time Calculation (min)  44 min    Activity Tolerance  Patient tolerated treatment well       No past medical history on file.  No past surgical history on file.  There were no vitals filed for this visit.  Subjective Assessment - 12/14/17 1708    Subjective  My back went out on me last Thursday and it hurts all the way across.  I have Lidocaine patches on there today.      Pertinent History  Cervical myelopathy with fusion; DDD; PTSD; HTN; LBP; left hip pain ;  Hammer toes contracted.      Currently in Pain?  Yes    Pain Score  8     Pain Location  Back    Pain Orientation  Right;Left    Pain Type  Chronic pain    Aggravating Factors   standing, walking                        [facility] Adult PT Treatment/Exercise - 12/14/17 0001      Knee/Hip Exercises: Aerobic   Nustep  seat 11 L1 10 min    while discussing progress     Knee/Hip Exercises: Seated   Other Seated Knee/Hip Exercises  floor slider 15x knee flexion with red band flexion and extension       Knee/Hip Exercises: Supine   Other Supine Knee/Hip Exercises  green band double and single  Clams and ball squeezes 20x each      Manual Therapy   Manual therapy comments  hip knee flexion 10x with light manual resist 10x    Joint Mobilization  long leg axis distraction with belt 5x 10 sec;  gentle lumbar distraction with belt 5x 10 sec grade 3 for all      Myofascial Release  Lt talocrural AP mobilization grade IV x4 bouts, Grade III-IV medial/lateral subtalar joint mobilization x2 bouts; Lt subtalar distraction x4 bouts     Passive ROM  passive right/left hip rotation and abduction 10x each       Ankle Exercises: Seated   Other Seated Ankle Exercises  rocker board double and single leg 20x each                PT Short Term Goals - 12/07/17 1316      PT SHORT TERM GOAL #1   Title  The patient will demonstrate compliance with intial home ex program     Status  Achieved      PT SHORT TERM GOAL #2   Title  The patient will demonstrate good body mechanics with rolling to get in/out of bed to relieve back pain with this activity    Status  Achieved      PT SHORT TERM GOAL #3   Title  The patient will be able  to walk 150 feet with most appropriate assistive device  needed for doctor's appointments and grocery shopping    Time  4    Period  Weeks    Status  On-going      PT SHORT TERM GOAL #4   Title  Left knee flexion ROM improved to 95 degrees needed for greater ease with sit to stand and negatiating curbs    Time  4    Period  Weeks    Status  On-going        PT Long Term Goals - 11/11/17 1316      PT LONG TERM GOAL #1   Title  The patient will be independent with safe self progression of HEP needed for further strengthening     Time  8    Period  Weeks    Status  New      PT LONG TERM GOAL #2   Title  The patient will report back and hip pain improved by 30% with usual ADLs    Time  8    Period  Weeks    Status  New      PT LONG TERM GOAL #3   Title  The patient will be able to ambulate with most appropriate assistive device 300 feet    Time  8    Period  Weeks    Status  New      PT LONG TERM GOAL #4   Title  The patient will have improved left hip strength to 4-/5 needed to lift his leg in/out of the car and for using the clutch     Time  8    Period  Weeks    Status  New      PT LONG TERM GOAL #5    Title  The patient will have improved knee strength to grossly 4/5 with greater ease with sit to stand and negotiating curbs    Time  8    Period  Weeks    Status  New      Additional Long Term Goals   Additional Long Term Goals  Yes      PT LONG TERM GOAL #6   Title  Timed up and go gait speed improved to 20 sec for safety with crossing parking lots            Plan - 12/14/17 1711    Clinical Impression Statement  Patient agreed to disclosure to [facility] of PT progress notes.  He continues to need a KAFO brace for correction of profound foot drop and genu recurvatum causing overuse of hip muscles, back muscles and increased risk of falls.  He reports relief of symptoms with hip and lumbar distraction as well as hip ROM however following treatment session he reports pain is "about the same."  Therapist closely monitoring response with all treatment interventions.  No additional goals met secondary to exacerbation of pain.      Rehab Potential  Fair    PT Frequency  2x / week    PT Duration  8 weeks    PT Treatment/Interventions  ADLs/Self Care Home Management;Therapeutic exercise;Therapeutic activities;Neuromuscular re-education;Patient/family education;Manual techniques;Taping    PT Next Visit Plan  check progress toward STGs; check hip flexion ROM for STG;   f/u on KAFO/AFO; Nu-Step; ankle mobilization and hip stretching; try supine clam;  try whole leg press down;sit to stand from very high surface;  left knee flexion ROM  PT Home Exercise Plan   Access Code: [email or web address]        Patient will benefit from skilled therapeutic intervention in order to improve the following deficits and impairments:  Abnormal gait, Pain, Decreased activity tolerance, Decreased endurance, Decreased strength, Impaired perceived functional ability, Difficulty walking, Decreased balance, Decreased range of motion  Visit Diagnosis: Muscle weakness (generalized)  Pain in left hip  Foot drop,  left  Other abnormalities of gait and mobility     Problem List There are no active problems to display for this patient.  [name], PT 12/14/17 5:19 PM Phone: [phone] Fax: [phone]  [name] 12/14/2017, 5:18 PM   [facility] [address] [place], [place], [address] Phone: [phone]   Fax:  [phone]  Name: Grant Taylor MRN: [id number] Date of Birth: Sep 01, 1945

## 2017-12-16 ENCOUNTER — Ambulatory Visit: Payer: No Typology Code available for payment source | Admitting: Physical Therapy

## 2017-12-16 ENCOUNTER — Encounter: Payer: Self-pay | Admitting: Physical Therapy

## 2017-12-16 DIAGNOSIS — M6281 Muscle weakness (generalized): Secondary | ICD-10-CM

## 2017-12-16 DIAGNOSIS — M21372 Foot drop, left foot: Secondary | ICD-10-CM

## 2017-12-16 DIAGNOSIS — M25552 Pain in left hip: Secondary | ICD-10-CM

## 2017-12-16 DIAGNOSIS — R2689 Other abnormalities of gait and mobility: Secondary | ICD-10-CM

## 2017-12-16 NOTE — Therapy (Signed)
[facility] [address] [place], [place], [address] Phone: [phone]   Fax:  [phone]  Physical Therapy Treatment  Patient Details  Name: Grant Taylor MRN: [id number] Date of Birth: Jul 15, 1945 Referring Provider (PT): [name]   Encounter Date: 12/16/2017  PT End of Session - 12/16/17 1455    Visit Number  8    Number of Visits  15    Date for PT Re-Evaluation  01/06/18    Authorization Type  VA approved 15 visits max    PT Start Time  5573    PT Stop Time  1444    PT Time Calculation (min)  45 min    Activity Tolerance  Patient limited by pain       History reviewed. No pertinent past medical history.  History reviewed. No pertinent surgical history.  There were no vitals filed for this visit.  Subjective Assessment - 12/16/17 1358    Subjective  My back is not doing well today.  Wearing Salonpas and back brace today. I had a hard time getting out of bed and getting dressed today.      Pertinent History  Cervical myelopathy with fusion; DDD; PTSD; HTN; LBP; left hip pain ;  Hammer toes contracted.      Currently in Pain?  Yes    Pain Score  9     Pain Location  Back                       [facility] Adult PT Treatment/Exercise - 12/16/17 0001      Lumbar Exercises: Seated   Other Seated Lumbar Exercises  black band resisted trunk extension 15x      Lumbar Exercises: Supine   Ab Set  10 reps    Glut Set  10 reps   with LEs on wedge   Clam  10 reps    Other Supine Lumbar Exercises  hip flexion isometric 10x right/left   into large red ball    Other Supine Lumbar Exercises  ball squeeze 15x       Manual Therapy   Manual therapy comments  hip knee flexion 10x with light manual resist 10x    Joint Mobilization  long leg axis distraction with belt 5x 10 sec;  gentle lumbar distraction with belt 5x 10 sec grade 3 for all     Myofascial Release  Lt talocrural AP mobilization  grade IV x4 bouts, Grade III-IV medial/lateral subtalar joint mobilization x2 bouts; Lt subtalar distraction x4 bouts     Passive ROM  passive right/left hip rotation and abduction 10x each                PT Short Term Goals - 12/07/17 1316      PT SHORT TERM GOAL #1   Title  The patient will demonstrate compliance with intial home ex program     Status  Achieved      PT SHORT TERM GOAL #2   Title  The patient will demonstrate good body mechanics with rolling to get in/out of bed to relieve back pain with this activity    Status  Achieved      PT SHORT TERM GOAL #3   Title  The patient will be able to walk 150 feet with most appropriate assistive device  needed for doctor's appointments and grocery shopping    Time  4    Period  Weeks    Status  On-going      PT SHORT TERM GOAL #4   Title  Left knee flexion ROM improved to 95 degrees needed for greater ease with sit to stand and negatiating curbs    Time  4    Period  Weeks    Status  On-going        PT Long Term Goals - 11/11/17 1316      PT LONG TERM GOAL #1   Title  The patient will be independent with safe self progression of HEP needed for further strengthening     Time  8    Period  Weeks    Status  New      PT LONG TERM GOAL #2   Title  The patient will report back and hip pain improved by 30% with usual ADLs    Time  8    Period  Weeks    Status  New      PT LONG TERM GOAL #3   Title  The patient will be able to ambulate with most appropriate assistive device 300 feet    Time  8    Period  Weeks    Status  New      PT LONG TERM GOAL #4   Title  The patient will have improved left hip strength to 4-/5 needed to lift his leg in/out of the car and for using the clutch     Time  8    Period  Weeks    Status  New      PT LONG TERM GOAL #5   Title  The patient will have improved knee strength to grossly 4/5 with greater ease with sit to stand and negotiating curbs    Time  8    Period  Weeks     Status  New      Additional Long Term Goals   Additional Long Term Goals  Yes      PT LONG TERM GOAL #6   Title  Timed up and go gait speed improved to 20 sec for safety with crossing parking lots            Plan - 12/16/17 1456    Clinical Impression Statement  The patient's primary complaint is low back pain exacerbation that is more concerning to him than his hip pain and lack of motor control in his left LE/foot drop.  Pain is somewhat better in supine with LEs on hip and knee bent position on wedge.  Some relief as well with hip moblizations and hip passive ROM.  Therapist closely monitoring response and modifying as needed for pain.  Slower to meet goals secondary to recent exacerbation of pain.      Rehab Potential  Fair    PT Frequency  2x / week    PT Duration  8 weeks    PT Treatment/Interventions  ADLs/Self Care Home Management;Therapeutic exercise;Therapeutic activities;Neuromuscular re-education;Patient/family education;Manual techniques;Taping    PT Next Visit Plan  resume Nu-Step as able;  patient to try Biofreeze;  supine with hips/knees 90/90;  check progress toward STGs; check hip flexion ROM for STG;   f/u on KAFO/AFO; Nu-Step; ankle mobilization and hip stretching; try supine clam;  try whole leg press down;sit to stand from very high surface;  left knee flexion ROM     PT Home Exercise Plan   Access Code: [email or web address]        Patient will benefit from skilled therapeutic intervention in order to  improve the following deficits and impairments:  Abnormal gait, Pain, Decreased activity tolerance, Decreased endurance, Decreased strength, Impaired perceived functional ability, Difficulty walking, Decreased balance, Decreased range of motion  Visit Diagnosis: Muscle weakness (generalized)  Pain in left hip  Foot drop, left  Other abnormalities of gait and mobility     Problem List There are no active problems to display for this patient.  [name],  PT 12/16/17 5:29 PM Phone: [phone] Fax: [phone]  [name] 12/16/2017, 5:29 PM  [facility] [address], [place], [place], [address] Phone: [phone]   Fax:  [phone]  Name: Grant Taylor MRN: [id number] Date of Birth: 27-Sep-1945

## 2017-12-21 ENCOUNTER — Encounter: Payer: Self-pay | Admitting: Physical Therapy

## 2017-12-21 ENCOUNTER — Ambulatory Visit: Payer: No Typology Code available for payment source | Admitting: Physical Therapy

## 2017-12-21 DIAGNOSIS — R2689 Other abnormalities of gait and mobility: Secondary | ICD-10-CM

## 2017-12-21 DIAGNOSIS — M6281 Muscle weakness (generalized): Secondary | ICD-10-CM

## 2017-12-21 DIAGNOSIS — M21372 Foot drop, left foot: Secondary | ICD-10-CM

## 2017-12-21 DIAGNOSIS — M25552 Pain in left hip: Secondary | ICD-10-CM

## 2017-12-21 NOTE — Therapy (Signed)
[facility] [address], [place], [place], [address] Phone: [phone]   Fax:  [phone]  Physical Therapy Treatment  Patient Details  Name: Grant Taylor MRN: [id number] Date of Birth: 12-Feb-1945 Referring Provider (PT): [name]   Encounter Date: 12/21/2017  PT End of Session - 12/21/17 1403    Visit Number  9    Number of Visits  15    Date for PT Re-Evaluation  01/06/18    Authorization Type  VA approved 15 visits max    PT Start Time  5374    PT Stop Time  1444    PT Time Calculation (min)  41 min    Activity Tolerance  Patient limited by pain    Behavior During Therapy  [name] for tasks assessed/performed       History reviewed. No pertinent past medical history.  History reviewed. No pertinent surgical history.  There were no vitals filed for this visit.  Subjective Assessment - 12/21/17 1402    Subjective  My back is a little better.  Wearing back brace.  Been using ice pack at home.  I'm not stumbling as much on my foot as it was.      Pertinent History  Cervical myelopathy with fusion; DDD; PTSD; HTN; LBP; left hip pain ;  Hammer toes contracted.      Patient Stated Goals  get rid of the cane and walk normally     Currently in Pain?  Yes    Pain Score  7     Pain Location  Back    Aggravating Factors   standing, walking          [facility] PT Assessment - 12/21/17 0001      AROM   Left Hip Flexion  95                   [facility] Adult PT Treatment/Exercise - 12/21/17 0001      Lumbar Exercises: Seated   Other Seated Lumbar Exercises  black band resisted trunk extension 25x    Other Seated Lumbar Exercises  floor slider with red band resist 15x      Lumbar Exercises: Supine   Clam  15 reps    Clam Limitations  LEs on green ball with blue band around thighs     Other Supine Lumbar Exercises  hip flexion isometric 10x right/left   LEs on green ball   Other Supine Lumbar  Exercises  ball squeeze 15x LEs on green ball       Knee/Hip Exercises: Aerobic   Nustep  seat 11 L1 8 min    while discussing progress     Manual Therapy   Joint Mobilization  long leg axis distraction with belt 5x 10 sec;  gentle lumbar distraction with belt 5x 10 sec grade 3 for all     Myofascial Release  Lt talocrural AP mobilization grade IV x4 bouts, Grade III-IV medial/lateral subtalar joint mobilization x2 bouts; Lt subtalar distraction x4 bouts     Passive ROM  passive right/left hip rotation and abduction 10x each                PT Short Term Goals - 12/21/17 1832      PT SHORT TERM GOAL #1   Title  The patient will demonstrate compliance with intial home ex program     Status  Achieved      PT SHORT TERM GOAL #2  Title  The patient will demonstrate good body mechanics with rolling to get in/out of bed to relieve back pain with this activity    Status  Achieved      PT SHORT TERM GOAL #3   Title  The patient will be able to walk 150 feet with most appropriate assistive device  needed for doctor's appointments and grocery shopping    Status  Achieved      PT SHORT TERM GOAL #4   Title  Left knee flexion ROM improved to 95 degrees needed for greater ease with sit to stand and negatiating curbs    Status  Achieved        PT Long Term Goals - 11/11/17 1316      PT LONG TERM GOAL #1   Title  The patient will be independent with safe self progression of HEP needed for further strengthening     Time  8    Period  Weeks    Status  New      PT LONG TERM GOAL #2   Title  The patient will report back and hip pain improved by 30% with usual ADLs    Time  8    Period  Weeks    Status  New      PT LONG TERM GOAL #3   Title  The patient will be able to ambulate with most appropriate assistive device 300 feet    Time  8    Period  Weeks    Status  New      PT LONG TERM GOAL #4   Title  The patient will have improved left hip strength to 4-/5 needed to lift  his leg in/out of the car and for using the clutch     Time  8    Period  Weeks    Status  New      PT LONG TERM GOAL #5   Title  The patient will have improved knee strength to grossly 4/5 with greater ease with sit to stand and negotiating curbs    Time  8    Period  Weeks    Status  New      Additional Long Term Goals   Additional Long Term Goals  Yes      PT LONG TERM GOAL #6   Title  Timed up and go gait speed improved to 20 sec for safety with crossing parking lots            Plan - 12/21/17 1830    Clinical Impression Statement  The patient is standing much more upright and has fewer pain behaviors maneuvering today.   He is able to resume some core and LE strengthening without exacerbation of pain.  All STGs met.  Therapist closely monitoring response with all treatment interventions.      Rehab Potential  Fair    PT Frequency  2x / week    PT Duration  8 weeks    PT Next Visit Plan  10th visit progress note; core and LE strengthening;  passive LE ROM;  seated resisted trunk extension black band  Nu-Step;  follow up on KAFO/AFO    PT Home Exercise Plan   Access Code: [email or web address]        Patient will benefit from skilled therapeutic intervention in order to improve the following deficits and impairments:  Abnormal gait, Pain, Decreased activity tolerance, Decreased endurance, Decreased strength, Impaired perceived functional ability, Difficulty walking, Decreased balance,  Decreased range of motion  Visit Diagnosis: Muscle weakness (generalized)  Pain in left hip  Foot drop, left  Other abnormalities of gait and mobility     Problem List There are no active problems to display for this patient.  [name], PT 12/21/17 6:38 PM Phone: [phone] Fax: [phone]  [name] 12/21/2017, 6:38 PM  [facility] [address], [place], [place], [address] Phone: [phone]   Fax:   [phone]  Name: Grant Taylor MRN: [id number] Date of Birth: [birth year]

## 2017-12-30 ENCOUNTER — Ambulatory Visit: Payer: No Typology Code available for payment source | Admitting: Physical Therapy

## 2017-12-31 ENCOUNTER — Encounter

## 2017-12-31 ENCOUNTER — Ambulatory Visit: Payer: No Typology Code available for payment source | Admitting: Physical Therapy

## 2018-01-04 ENCOUNTER — Ambulatory Visit: Payer: No Typology Code available for payment source | Attending: Family Medicine | Admitting: Physical Therapy

## 2018-01-04 ENCOUNTER — Ambulatory Visit: Payer: No Typology Code available for payment source | Admitting: Physical Therapy

## 2018-01-04 DIAGNOSIS — M25552 Pain in left hip: Secondary | ICD-10-CM | POA: Diagnosis present

## 2018-01-04 DIAGNOSIS — R2689 Other abnormalities of gait and mobility: Secondary | ICD-10-CM | POA: Diagnosis present

## 2018-01-04 DIAGNOSIS — M6281 Muscle weakness (generalized): Secondary | ICD-10-CM | POA: Diagnosis present

## 2018-01-04 DIAGNOSIS — M21372 Foot drop, left foot: Secondary | ICD-10-CM | POA: Insufficient documentation

## 2018-01-04 NOTE — Therapy (Signed)
[facility] [address], [place], [place], [address] Phone: [phone]   Fax:  [phone]  Physical Therapy Treatment/Recertification  Patient Details  Name: Grant Taylor MRN: [id number] Date of Birth: 16-Mar-1945 Referring Provider (PT): [name]  Progress Note Reporting Period 11/11/17 to 02/09/2018  See note below for Objective Data and Assessment of Progress/Goals.      Encounter Date: 01/04/2018  PT End of Session - 01/04/18 1855    Visit Number  10    Number of Visits  15    Date for PT Re-Evaluation  02/09/18    Authorization Type  VA approved 15 visits max    PT Start Time  4259    PT Stop Time  5638    PT Time Calculation (min)  45 min    Activity Tolerance  Patient tolerated treatment well       No past medical history on file.  No past surgical history on file.  There were no vitals filed for this visit.  Subjective Assessment - 01/04/18 1356    Subjective  I went to the [place].  The doctor said my ankle motion will never come back.   They are going to refer me to [employer] for an KAFO.  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      Pertinent History  Cervical myelopathy with fusion; DDD; PTSD; HTN; LBP; left hip pain ;  Hammer toes contracted.      Currently in Pain?  Yes    Pain Location  Hip    Pain Orientation  Left    Pain Type  Chronic pain    Aggravating Factors   standing, walking         [facility] PT Assessment - 01/04/18 0001      AROM   Left Hip Flexion  100    Left Ankle Dorsiflexion  -5    Left Ankle Eversion  0      Strength   Right Hip Flexion  4/5    Right Hip Extension  4/5    Right Hip External Rotation   4/5    Right Hip Internal Rotation  4/5    Right Hip ABduction  4-/5    Right Hip ADduction  4/5    Left Hip Flexion  3+/5    Left Hip Extension  2/5    Left Hip External Rotation  3/5    Right Knee Flexion  4/5    Right Knee Extension  4/5    Left Knee  Flexion  4-/5    Left Knee Extension  4-/5    Right Ankle Dorsiflexion  4/5    Right Ankle Plantar Flexion  4/5    Right Ankle Inversion  4/5    Right Ankle Eversion  4/5    Left Ankle Dorsiflexion  0/5    Left Ankle Plantar Flexion  2-/5    Left Ankle Inversion  2-/5    Left Ankle Eversion  0/5      Timed Up and Go Test   TUG Comments  30   with forearm crutch                   [facility] Adult PT Treatment/Exercise - 01/04/18 0001      Lumbar Exercises: Seated   Other Seated Lumbar Exercises  black band resisted trunk extension 25x      Lumbar Exercises: Supine   Ab Set  10 reps  Clam Limitations  LEs on green ball with blue band around thighs     Other Supine Lumbar Exercises  hip flexion isometric 10x right/left   LEs on green ball     Knee/Hip Exercises: Aerobic   Nustep  seat 11 L2 8 min    while discussing progress     Knee/Hip Exercises: Seated   Other Seated Knee/Hip Exercises  5# 15x hip flexion    Other Seated Knee/Hip Exercises  5# LAQ 15x       Knee/Hip Exercises: Supine   Other Supine Knee/Hip Exercises  green band double and single       Manual Therapy   Joint Mobilization  long leg axis distraction with belt 5x 10 sec;  gentle lumbar distraction with belt 5x 10 sec grade 3 for all     Myofascial Release  Lt talocrural AP mobilization grade IV x4 bouts, Grade III-IV medial/lateral subtalar joint mobilization x2 bouts; Lt subtalar distraction x4 bouts     Passive ROM  passive right/left hip rotation and abduction 10x each                PT Short Term Goals - 01/04/18 1406      PT SHORT TERM GOAL #1   Title  The patient will demonstrate compliance with intial home ex program     Status  Achieved      PT SHORT TERM GOAL #2   Title  The patient will demonstrate good body mechanics with rolling to get in/out of bed to relieve back pain with this activity    Status  Achieved      PT SHORT TERM GOAL #3   Title  The patient will be able  to walk 150 feet with most appropriate assistive device  needed for doctor's appointments and grocery shopping    Baseline  100 yards    Status  Achieved      PT SHORT TERM GOAL #4   Title  Left knee flexion ROM improved to 95 degrees needed for greater ease with sit to stand and negatiating curbs    Status  Achieved        PT Long Term Goals - 01/04/18 1407      PT LONG TERM GOAL #1   Title  The patient will be independent with safe self progression of HEP needed for further strengthening     Time  8    Period  Weeks    Status  On-going    Target Date  02/09/17      PT LONG TERM GOAL #2   Title  The patient will report back and hip pain improved by 30% with usual ADLs    Time  8    Period  Weeks    Status  On-going      PT LONG TERM GOAL #3   Title  The patient will be able to ambulate with most appropriate assistive device 300 feet    Status  Achieved      PT LONG TERM GOAL #4   Title  The patient will have improved left hip strength to 4-/5 needed to lift his leg in/out of the car and for using the clutch     Period  Weeks    Status  On-going      PT LONG TERM GOAL #5   Title  The patient will have improved knee strength to grossly 4/5 with greater ease with sit to stand and negotiating curbs  Time  8    Period  Weeks    Status  On-going      PT LONG TERM GOAL #6   Title  Timed up and go gait speed improved to 20 sec for safety with crossing parking lots    Time  8    Period  Weeks            Plan - 01/04/18 1856    Clinical Impression Statement  The patient reports no change in LBP and hip pain since starting PT although he reports he is standing more erect and "walking better."  He is now able to walk approx 300 yards.  Progress has been slowed secondary to a low flare up.  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  His hip, knee, and foot motor weakness causes him to hyper flex his hip in order  advance his leg when ambulating and may also contribute to his ongoing pain.   I have recommended a KAFO and he is awaiting a consultation with [employer].  He would benefit from PT 3-4 more weeks for further strengthening of core and LEs, pain relieving techniques and gait training as needed with KAFO.      Rehab Potential  Fair    PT Frequency  2x / week    PT Duration  8 weeks    PT Treatment/Interventions  ADLs/Self Care Home Management;Therapeutic exercise;Therapeutic activities;Neuromuscular re-education;Patient/family education;Manual techniques;Taping    PT Next Visit Plan  core and LE strengthening;  passive LE ROM;  seated resisted trunk extension black band  Nu-Step;  follow up on KAFO/AFO    PT Home Exercise Plan   Access Code: [email or web address]     Recommended Other Services  KAFO referral made to [employer]       Patient will benefit from skilled therapeutic intervention in order to improve the following deficits and impairments:  Abnormal gait, Pain, Decreased activity tolerance, Decreased endurance, Decreased strength, Impaired perceived functional ability, Difficulty walking, Decreased balance, Decreased range of motion  Visit Diagnosis: Muscle weakness (generalized) - Plan: PT plan of care cert/re-cert  Pain in left hip - Plan: PT plan of care cert/re-cert  Foot drop, left - Plan: PT plan of care cert/re-cert  Other abnormalities of gait and mobility - Plan: PT plan of care cert/re-cert     Problem List There are no active problems to display for this patient.  [name], PT 01/04/18 7:16 PM Phone: [phone] Fax: [phone]  [name] 01/04/2018, 7:14 PM  [facility] [address], [place], [place], [address] Phone: [phone]   Fax:  [phone]  Name: Grant Taylor MRN: [id number] Date of Birth: [birth year]

## 2018-01-14 ENCOUNTER — Ambulatory Visit: Payer: No Typology Code available for payment source | Admitting: Physical Therapy

## 2018-01-14 ENCOUNTER — Encounter: Payer: Self-pay | Admitting: Physical Therapy

## 2018-01-14 ENCOUNTER — Encounter

## 2018-01-14 DIAGNOSIS — M6281 Muscle weakness (generalized): Secondary | ICD-10-CM

## 2018-01-14 DIAGNOSIS — M21372 Foot drop, left foot: Secondary | ICD-10-CM

## 2018-01-14 DIAGNOSIS — R2689 Other abnormalities of gait and mobility: Secondary | ICD-10-CM

## 2018-01-14 DIAGNOSIS — M25552 Pain in left hip: Secondary | ICD-10-CM

## 2018-01-14 NOTE — Therapy (Signed)
[facility] [address] [place], [place], [address] Phone: [phone]   Fax:  [phone]  Physical Therapy Treatment  Patient Details  Name: Grant Taylor MRN: [id number] Date of Birth: 03/08/1945 Referring Provider (PT): [name]   Encounter Date: 01/14/2018  PT End of Session - 01/14/18 1040    Visit Number  11    Number of Visits  15    Date for PT Re-Evaluation  02/09/18    Authorization Type  VA approved 15 visits max    PT Start Time  3235    PT Stop Time  1055    PT Time Calculation (min)  40 min    Activity Tolerance  Patient tolerated treatment well       History reviewed. No pertinent past medical history.  History reviewed. No pertinent surgical history.  There were no vitals filed for this visit.  Subjective Assessment - 01/14/18 1018    Subjective  I have an appt to be fitted for a KAFO by [employer] on Monday.  My back is bothering me today.      Pertinent History  Cervical myelopathy with fusion; DDD; PTSD; HTN; LBP; left hip pain ;  Hammer toes contracted.      How long can you stand comfortably?  10-15 min from myelopathy    Patient Stated Goals  get rid of the cane and walk normally     Currently in Pain?  Yes    Pain Location  Back    Pain Orientation  Lower;Right;Left    Aggravating Factors   standing and walking                       [facility] Adult PT Treatment/Exercise - 01/14/18 0001      Lumbar Exercises: Stretches   Other Lumbar Stretch Exercise  seated green ball roll for flexion 15x      Lumbar Exercises: Seated   Other Seated Lumbar Exercises  black band resisted trunk extension 25x    Other Seated Lumbar Exercises  green band rows and extensions 10x each       Lumbar Exercises: Supine   Clam  15 reps    Clam Limitations  red band     Heel Slides Limitations  from wedge 10x    Bridge  10 reps    Other Supine Lumbar Exercises  UE push hands  toward knees, hand to opposite knee 5x each against red ball       Knee/Hip Exercises: Seated   Other Seated Knee/Hip Exercises  5# 20x hip flexion    Other Seated Knee/Hip Exercises  5# LAQ 20x                PT Short Term Goals - 01/04/18 1406      PT SHORT TERM GOAL #1   Title  The patient will demonstrate compliance with intial home ex program     Status  Achieved      PT SHORT TERM GOAL #2   Title  The patient will demonstrate good body mechanics with rolling to get in/out of bed to relieve back pain with this activity    Status  Achieved      PT SHORT TERM GOAL #3   Title  The patient will be able to walk 150 feet with most appropriate assistive device  needed for doctor's appointments and grocery shopping    Baseline  100 yards    Status  Achieved      PT SHORT TERM GOAL #4   Title  Left knee flexion ROM improved to 95 degrees needed for greater ease with sit to stand and negatiating curbs    Status  Achieved        PT Long Term Goals - 01/04/18 1407      PT LONG TERM GOAL #1   Title  The patient will be independent with safe self progression of HEP needed for further strengthening     Time  8    Period  Weeks    Status  On-going    Target Date  02/09/17      PT LONG TERM GOAL #2   Title  The patient will report back and hip pain improved by 30% with usual ADLs    Time  8    Period  Weeks    Status  On-going      PT LONG TERM GOAL #3   Title  The patient will be able to ambulate with most appropriate assistive device 300 feet    Status  Achieved      PT LONG TERM GOAL #4   Title  The patient will have improved left hip strength to 4-/5 needed to lift his leg in/out of the car and for using the clutch     Period  Weeks    Status  On-going      PT LONG TERM GOAL #5   Title  The patient will have improved knee strength to grossly 4/5 with greater ease with sit to stand and negotiating curbs    Time  8    Period  Weeks    Status  On-going      PT  LONG TERM GOAL #6   Title  Timed up and go gait speed improved to 20 sec for safety with crossing parking lots    Time  8    Period  Weeks            Plan - 01/14/18 1043    Clinical Impression Statement  The patient reports increased back/hip pain today associated with standing and walking.   He is able to perform supine and seated exercises without pain exacerbation or pain behaviors but declines the Nu-Step stating it bothers his back sometimes.  He is being fitted for a KAFO on Monday which should help with ambulation endurance and pain.  He reports good pain relief with hip  adductor stretching.  Therapist closely monitoring response with all treatment interventions and modifying as needed.      Rehab Potential  Fair    PT Frequency  2x / week    PT Duration  8 weeks    PT Treatment/Interventions  ADLs/Self Care Home Management;Therapeutic exercise;Therapeutic activities;Neuromuscular re-education;Patient/family education;Manual techniques;Taping    PT Next Visit Plan  core and LE strengthening; try to progress to standing ex as tolerated;  passive LE ROM;  seated resisted trunk extension black band  Nu-Step;  follow up on KAFO/AFO    PT Home Exercise Plan   Access Code: [email or web address]        Patient will benefit from skilled therapeutic intervention in order to improve the following deficits and impairments:  Abnormal gait, Pain, Decreased activity tolerance, Decreased endurance, Decreased strength, Impaired perceived functional ability, Difficulty walking, Decreased balance, Decreased range of motion  Visit Diagnosis: Muscle weakness (generalized)  Pain in left hip  Foot drop, left  Other abnormalities of gait and mobility  Problem List There are no active problems to display for this patient.  [name], PT 01/14/18 11:20 AM Phone: [phone] Fax: [phone]  [name] 01/14/2018, 11:19 AM  [facility] [address] [place], [place], [address] Phone: [phone]   Fax:  [phone]  Name: Grant Taylor MRN: [id number] Date of Birth: Dec 06, 1945

## 2018-01-20 ENCOUNTER — Ambulatory Visit: Payer: No Typology Code available for payment source | Admitting: Physical Therapy

## 2018-01-25 ENCOUNTER — Ambulatory Visit: Payer: No Typology Code available for payment source | Admitting: Physical Therapy

## 2018-01-25 ENCOUNTER — Encounter: Payer: Self-pay | Admitting: Physical Therapy

## 2018-01-25 DIAGNOSIS — M25552 Pain in left hip: Secondary | ICD-10-CM

## 2018-01-25 DIAGNOSIS — M6281 Muscle weakness (generalized): Secondary | ICD-10-CM | POA: Diagnosis not present

## 2018-01-25 DIAGNOSIS — M21372 Foot drop, left foot: Secondary | ICD-10-CM

## 2018-01-25 DIAGNOSIS — R2689 Other abnormalities of gait and mobility: Secondary | ICD-10-CM

## 2018-01-25 NOTE — Therapy (Signed)
[facility] [address] [place], [place], [address] Phone: [phone]   Fax:  [phone]  Physical Therapy Treatment  Patient Details  Name: Grant Taylor MRN: [id number] Date of Birth: [birth year] Referring Provider (PT): [name]   Encounter Date: 01/25/2018  PT End of Session - 01/25/18 1323    Visit Number  12    Number of Visits  15    Date for PT Re-Evaluation  02/09/18    Authorization Type  VA approved 15 visits max    PT Start Time  1230    PT Stop Time  1314    PT Time Calculation (min)  44 min    Activity Tolerance  Patient tolerated treatment well;No increased pain    Behavior During Therapy  [facility] for tasks assessed/performed       History reviewed. No pertinent past medical history.  History reviewed. No pertinent surgical history.  There were no vitals filed for this visit.  Subjective Assessment - 01/25/18 1232    Subjective  Pt reports that his appointment went well at [facility]. He is going to be fitted once it is approved by the [place].     Pertinent History  Cervical myelopathy with fusion; DDD; PTSD; HTN; LBP; left hip pain ;  Hammer toes contracted.      How long can you stand comfortably?  10-15 min from myelopathy    Patient Stated Goals  get rid of the cane and walk normally     Currently in Pain?  --   some discomfort in Lt hip noted                       [facility] Adult PT Treatment/Exercise - 01/25/18 0001      Lumbar Exercises: Stretches   Other Lumbar Stretch Exercise  seated green ball roll for flexion 15x      Lumbar Exercises: Seated   Other Seated Lumbar Exercises  trunk rotation Lt and Rt x15 reps with black TB     Other Seated Lumbar Exercises  rows with green TB x20 reps      Lumbar Exercises: Supine   Clam  15 reps    Clam Limitations  green TB     Other Supine Lumbar Exercises  UE push hands toward knees, hand to opposite knee 15x each against  red ball       Knee/Hip Exercises: Seated   Long Arc Quad  Strengthening;Right;Left;20 reps    Long Arc Quad Weight  5 lbs.      Knee/Hip Exercises: Supine   Heel Slides  Both;1 set;20 reps    Heel Slides Limitations  5# ankle weights      Manual Therapy   Manual therapy comments  Lt and Rt glute max and piriformis stretch 3x15 sec each     Joint Mobilization  B hip long axis distraction grade III-IV x3 bouts    Passive ROM  Lt and Rt hip abduction x5 reps each, IR/ER/flexion x5 reps each             PT Education - 01/25/18 1322    Education Details  technique with therex    Person(s) Educated  Patient    Methods  Explanation;Verbal cues    Comprehension  Verbalized understanding       PT Short Term Goals - 01/04/18 1406      PT SHORT TERM GOAL #1   Title  The patient will demonstrate  compliance with intial home ex program     Status  Achieved      PT SHORT TERM GOAL #2   Title  The patient will demonstrate good body mechanics with rolling to get in/out of bed to relieve back pain with this activity    Status  Achieved      PT SHORT TERM GOAL #3   Title  The patient will be able to walk 150 feet with most appropriate assistive device  needed for doctor's appointments and grocery shopping    Baseline  100 yards    Status  Achieved      PT SHORT TERM GOAL #4   Title  Left knee flexion ROM improved to 95 degrees needed for greater ease with sit to stand and negatiating curbs    Status  Achieved        PT Long Term Goals - 01/04/18 1407      PT LONG TERM GOAL #1   Title  The patient will be independent with safe self progression of HEP needed for further strengthening     Time  8    Period  Weeks    Status  On-going    Target Date  02/09/17      PT LONG TERM GOAL #2   Title  The patient will report back and hip pain improved by 30% with usual ADLs    Time  8    Period  Weeks    Status  On-going      PT LONG TERM GOAL #3   Title  The patient will be  able to ambulate with most appropriate assistive device 300 feet    Status  Achieved      PT LONG TERM GOAL #4   Title  The patient will have improved left hip strength to 4-/5 needed to lift his leg in/out of the car and for using the clutch     Period  Weeks    Status  On-going      PT LONG TERM GOAL #5   Title  The patient will have improved knee strength to grossly 4/5 with greater ease with sit to stand and negotiating curbs    Time  8    Period  Weeks    Status  On-going      PT LONG TERM GOAL #6   Title  Timed up and go gait speed improved to 20 sec for safety with crossing parking lots    Time  8    Period  Weeks            Plan - 01/25/18 1323    Clinical Impression Statement  Pt arrived with mild Lt hip discomfort however he was able to complete all of today's exercises without exacerbation of his pain. He does require some cuing to improve his technique with several exercises, but overall had good understanding. Ended with manual treatment to bilateral hips, with pt noting relieved Lt hip pain end of session. Pt was instructed in self gluteal stretch for home and was able to demonstrate understanding of this.    Rehab Potential  Fair    PT Frequency  2x / week    PT Duration  8 weeks    PT Treatment/Interventions  ADLs/Self Care Home Management;Therapeutic exercise;Therapeutic activities;Neuromuscular re-education;Patient/family education;Manual techniques;Taping    PT Next Visit Plan  core and LE strengthening; try to progress to standing ex as tolerated;  passive LE ROM;  seated resisted trunk extension  black band     PT Home Exercise Plan   Access Code: [email or web address]     Recommended Other Services  Nustep bothers pt's hips per his report    Consulted and Agree with Plan of Care  Patient       Patient will benefit from skilled therapeutic intervention in order to improve the following deficits and impairments:  Abnormal gait, Pain, Decreased activity tolerance,  Decreased endurance, Decreased strength, Impaired perceived functional ability, Difficulty walking, Decreased balance, Decreased range of motion  Visit Diagnosis: Muscle weakness (generalized)  Pain in left hip  Foot drop, left  Other abnormalities of gait and mobility     Problem List There are no active problems to display for this patient.   1:27 PM,01/25/18 [place], DPT [facility] [address] [place], [place], [address] Phone: [phone]   Fax:  [phone]  Name: Mckoy Bhakta MRN: [id number] Date of Birth: January 17, 1946

## 2018-01-27 ENCOUNTER — Encounter: Payer: Self-pay | Admitting: Physical Therapy

## 2018-01-27 ENCOUNTER — Ambulatory Visit: Payer: No Typology Code available for payment source | Attending: Family Medicine | Admitting: Physical Therapy

## 2018-01-27 DIAGNOSIS — R2689 Other abnormalities of gait and mobility: Secondary | ICD-10-CM | POA: Diagnosis present

## 2018-01-27 DIAGNOSIS — M25552 Pain in left hip: Secondary | ICD-10-CM | POA: Diagnosis present

## 2018-01-27 DIAGNOSIS — M6281 Muscle weakness (generalized): Secondary | ICD-10-CM | POA: Diagnosis present

## 2018-01-27 DIAGNOSIS — M21372 Foot drop, left foot: Secondary | ICD-10-CM | POA: Diagnosis present

## 2018-01-27 NOTE — Therapy (Signed)
[facility] [address], [place], [place], [address] Phone: [phone]   Fax:  [phone]  Physical Therapy Treatment  Patient Details  Name: Grant Taylor MRN: [id number] Date of Birth: 02/08/1945 Referring Provider (PT): [name]   Encounter Date: 01/27/2018  PT End of Session - 01/27/18 1257    Visit Number  13    Number of Visits  15    Date for PT Re-Evaluation  02/09/18    Authorization Type  VA approved 15 visits max + 15 more per Effie Berkshire 01/27/18    PT Start Time  1232    PT Stop Time  1310    PT Time Calculation (min)  38 min    Activity Tolerance  Patient tolerated treatment well       History reviewed. No pertinent past medical history.  History reviewed. No pertinent surgical history.  There were no vitals filed for this visit.  Subjective Assessment - 01/27/18 1239    Subjective  My left hip has been bothering me today.  It's a 10/10 when I stand, 5/10 when I sit.  I'm wearing a copper back support which helps some.                         [facility] Adult PT Treatment/Exercise - 01/27/18 0001      Lumbar Exercises: Seated   Other Seated Lumbar Exercises  black band resisted trunk extension 25x    Other Seated Lumbar Exercises  rows with green TB x20 reps      Lumbar Exercises: Supine   Clam  15 reps    Clam Limitations  blue band       Knee/Hip Exercises: Seated   Long Arc Quad  Strengthening;Right;Left;20 reps    Long Arc Quad Weight  5 lbs.    Other Seated Knee/Hip Exercises  5# 20x hip flexion      Manual Therapy   Manual therapy comments  Lt and Rt glute max and piriformis stretch 3x15 sec each     Joint Mobilization  B hip long axis distraction grade III-IV x3 bouts    Passive ROM  Lt and Rt hip abduction x5 reps each, IR/ER/flexion x5 reps each               PT Short Term Goals - 01/04/18 1406      PT SHORT TERM GOAL #1   Title  The patient will  demonstrate compliance with intial home ex program     Status  Achieved      PT SHORT TERM GOAL #2   Title  The patient will demonstrate good body mechanics with rolling to get in/out of bed to relieve back pain with this activity    Status  Achieved      PT SHORT TERM GOAL #3   Title  The patient will be able to walk 150 feet with most appropriate assistive device  needed for doctor's appointments and grocery shopping    Baseline  100 yards    Status  Achieved      PT SHORT TERM GOAL #4   Title  Left knee flexion ROM improved to 95 degrees needed for greater ease with sit to stand and negatiating curbs    Status  Achieved        PT Long Term Goals - 01/04/18 1407      PT LONG TERM GOAL #1   Title  The patient  will be independent with safe self progression of HEP needed for further strengthening     Time  8    Period  Weeks    Status  On-going    Target Date  02/09/17      PT LONG TERM GOAL #2   Title  The patient will report back and hip pain improved by 30% with usual ADLs    Time  8    Period  Weeks    Status  On-going      PT LONG TERM GOAL #3   Title  The patient will be able to ambulate with most appropriate assistive device 300 feet    Status  Achieved      PT LONG TERM GOAL #4   Title  The patient will have improved left hip strength to 4-/5 needed to lift his leg in/out of the car and for using the clutch     Period  Weeks    Status  On-going      PT LONG TERM GOAL #5   Title  The patient will have improved knee strength to grossly 4/5 with greater ease with sit to stand and negotiating curbs    Time  8    Period  Weeks    Status  On-going      PT LONG TERM GOAL #6   Title  Timed up and go gait speed improved to 20 sec for safety with crossing parking lots    Time  8    Period  Weeks            Plan - 01/27/18 1314    Clinical Impression Statement  The patient reports decreased hip pain following treatment session.  He is able to peform increased  repetitions  or resistance with  most exercises.  He is awaiting a KAFO and we hope to add more standing exercises as pain allows.  Hold on Nu-Step per patient request.  He feels this may aggravate his hip/back.   Therapist closely monitoring response with all interventions.      Rehab Potential  Fair    PT Frequency  2x / week    PT Duration  8 weeks    PT Treatment/Interventions  ADLs/Self Care Home Management;Therapeutic exercise;Therapeutic activities;Neuromuscular re-education;Patient/family education;Manual techniques;Taping    PT Next Visit Plan  core and LE strengthening; try to progress to standing ex as tolerated;  passive LE ROM;  seated resisted trunk extension black band    PT Home Exercise Plan   Access Code: [email or web address]        Patient will benefit from skilled therapeutic intervention in order to improve the following deficits and impairments:  Abnormal gait, Pain, Decreased activity tolerance, Decreased endurance, Decreased strength, Impaired perceived functional ability, Difficulty walking, Decreased balance, Decreased range of motion  Visit Diagnosis: Muscle weakness (generalized)  Pain in left hip  Foot drop, left  Other abnormalities of gait and mobility     Problem List There are no active problems to display for this patient.  [name], PT 01/27/18 1:54 PM Phone: [phone] Fax: [phone]  [name] 01/27/2018, 1:54 PM  [facility] [address] [place], [place], [address] Phone: [phone]   Fax:  [phone]  Name: Grant Taylor MRN: [id number] Date of Birth: 04-30-1945

## 2018-02-03 ENCOUNTER — Ambulatory Visit: Payer: No Typology Code available for payment source | Admitting: Physical Therapy

## 2018-02-03 ENCOUNTER — Encounter: Payer: Self-pay | Admitting: Physical Therapy

## 2018-02-03 DIAGNOSIS — M25552 Pain in left hip: Secondary | ICD-10-CM

## 2018-02-03 DIAGNOSIS — M21372 Foot drop, left foot: Secondary | ICD-10-CM

## 2018-02-03 DIAGNOSIS — M6281 Muscle weakness (generalized): Secondary | ICD-10-CM | POA: Diagnosis not present

## 2018-02-03 DIAGNOSIS — R2689 Other abnormalities of gait and mobility: Secondary | ICD-10-CM

## 2018-02-03 NOTE — Therapy (Signed)
[facility] [address] [place], [place], [address] Phone: [phone]   Fax:  [phone]  Physical Therapy Treatment  Patient Details  Name: Grant Taylor MRN: [id number] Date of Birth: 1945/08/24 Referring Provider (PT): [name]   Encounter Date: 02/03/2018  PT End of Session - 02/03/18 1702    Visit Number  14    Number of Visits  15    Date for PT Re-Evaluation  02/09/18    Authorization Type  VA approved 15 visits max + 15 more per Effie Berkshire 01/27/18    PT Start Time  3536    PT Stop Time  1610    PT Time Calculation (min)  45 min    Activity Tolerance  Patient tolerated treatment well       History reviewed. No pertinent past medical history.  History reviewed. No pertinent surgical history.  There were no vitals filed for this visit.  Subjective Assessment - 02/03/18 1528    Subjective  I had acupunture yesterday which helps.   My back tightened up on me this morning and that left hip bothers me.  Things are progressing with getting the KAFO, just awaiting follow up with [employer] for measurements.      Pertinent History  Cervical myelopathy with fusion; DDD; PTSD; HTN; LBP; left hip pain ;  Hammer toes contracted.      How long can you stand comfortably?  I walked 20 min from the [place] parking lot into the building    Currently in Pain?  Yes    Pain Score  7     Pain Location  Back         [facility] PT Assessment - 02/03/18 0001      Strength   Right Hip Flexion  4/5    Right Hip Extension  4/5    Right Hip External Rotation   4/5    Right Hip Internal Rotation  4/5    Right Hip ABduction  4-/5    Right Hip ADduction  4/5    Left Hip Flexion  4-/5    Left Hip Extension  3-/5    Right Knee Flexion  4/5    Right Knee Extension  4/5    Left Knee Flexion  4-/5    Left Knee Extension  4-/5      Timed Up and Go Test   TUG Comments  25.96                   [facility] Adult PT  Treatment/Exercise - 02/03/18 0001      Lumbar Exercises: Seated   Other Seated Lumbar Exercises  black band resisted trunk extension 25x      Lumbar Exercises: Supine   Clam  15 reps    Clam Limitations  blue band     Other Supine Lumbar Exercises  marching with blue band around thighs      Knee/Hip Exercises: Standing   Hip Abduction  AROM;Right;Left;10 reps    Hip Extension  AROM;Right;Left;10 reps    Other Standing Knee Exercises  step taps but discontinuted after 5 reps secondary to left knee excessive hyperextension       Knee/Hip Exercises: Seated   Long Arc Quad  Strengthening;Right;Left;20 reps    Long Arc Quad Weight  5 lbs.    Other Seated Knee/Hip Exercises  5# 20x hip flexion    Sit to Sand  15 reps;without UE support   from very high  mat table               PT Short Term Goals - 01/04/18 1406      PT SHORT TERM GOAL #1   Title  The patient will demonstrate compliance with intial home ex program     Status  Achieved      PT SHORT TERM GOAL #2   Title  The patient will demonstrate good body mechanics with rolling to get in/out of bed to relieve back pain with this activity    Status  Achieved      PT SHORT TERM GOAL #3   Title  The patient will be able to walk 150 feet with most appropriate assistive device  needed for doctor's appointments and grocery shopping    Baseline  100 yards    Status  Achieved      PT SHORT TERM GOAL #4   Title  Left knee flexion ROM improved to 95 degrees needed for greater ease with sit to stand and negatiating curbs    Status  Achieved        PT Long Term Goals - 01/04/18 1407      PT LONG TERM GOAL #1   Title  The patient will be independent with safe self progression of HEP needed for further strengthening     Time  8    Period  Weeks    Status  On-going    Target Date  02/09/17      PT LONG TERM GOAL #2   Title  The patient will report back and hip pain improved by 30% with usual ADLs    Time  8    Period   Weeks    Status  On-going      PT LONG TERM GOAL #3   Title  The patient will be able to ambulate with most appropriate assistive device 300 feet    Status  Achieved      PT LONG TERM GOAL #4   Title  The patient will have improved left hip strength to 4-/5 needed to lift his leg in/out of the car and for using the clutch     Period  Weeks    Status  On-going      PT LONG TERM GOAL #5   Title  The patient will have improved knee strength to grossly 4/5 with greater ease with sit to stand and negotiating curbs    Time  8    Period  Weeks    Status  On-going      PT LONG TERM GOAL #6   Title  Timed up and go gait speed improved to 20 sec for safety with crossing parking lots    Time  8    Period  Weeks            Plan - 02/03/18 1703    Clinical Impression Statement  Despite initial complaints of usual LBP and hip pain, the patient is able to stand much more erect.  Much improved gait speed with Timed up and Go test and improving LE strength at the hip and knee although he continues to neurological weakness causing knee hyperextension with full weight bearing in standing.  He is awaiting his KAFO from the prosthetist which should improve his gait efficiency and help reduce back and hip pain.  He is able to progress exercise intensity to include standing ex's without exacerbation of pain.  Therapist closely monitoring response with all interventions.  Rehab Potential  Fair    PT Frequency  2x / week    PT Duration  8 weeks    PT Treatment/Interventions  ADLs/Self Care Home Management;Therapeutic exercise;Therapeutic activities;Neuromuscular re-education;Patient/family education;Manual techniques;Taping    PT Next Visit Plan  ERO on 1/15;  core and LE strengthening; standing ex as tolerated;  passive LE ROM;  seated resisted trunk extension black band;  hold on Nu-Step (sometimes bothers his back)    PT Home Exercise Plan   Access Code: [email or web address]     Recommended Other Services   awaiting KAFO       Patient will benefit from skilled therapeutic intervention in order to improve the following deficits and impairments:  Abnormal gait, Pain, Decreased activity tolerance, Decreased endurance, Decreased strength, Impaired perceived functional ability, Difficulty walking, Decreased balance, Decreased range of motion  Visit Diagnosis: Muscle weakness (generalized)  Pain in left hip  Foot drop, left  Other abnormalities of gait and mobility     Problem List There are no active problems to display for this patient.  [name], PT 02/03/18 5:10 PM Phone: [phone] Fax: [phone]  [name] 02/03/2018, 5:10 PM  [facility] [address], [place], [place], [address] Phone: [phone]   Fax:  [phone]  Name: Grant Taylor MRN: [id number] Date of Birth: [birth year]

## 2018-02-10 ENCOUNTER — Ambulatory Visit: Payer: No Typology Code available for payment source | Admitting: Physical Therapy

## 2018-02-17 ENCOUNTER — Encounter: Payer: No Typology Code available for payment source | Admitting: Physical Therapy

## 2018-02-24 ENCOUNTER — Ambulatory Visit: Payer: No Typology Code available for payment source | Admitting: Physical Therapy

## 2018-02-24 ENCOUNTER — Encounter: Payer: Self-pay | Admitting: Physical Therapy

## 2018-02-24 DIAGNOSIS — R2689 Other abnormalities of gait and mobility: Secondary | ICD-10-CM

## 2018-02-24 DIAGNOSIS — M21372 Foot drop, left foot: Secondary | ICD-10-CM

## 2018-02-24 DIAGNOSIS — M6281 Muscle weakness (generalized): Secondary | ICD-10-CM

## 2018-02-24 DIAGNOSIS — M25552 Pain in left hip: Secondary | ICD-10-CM

## 2018-02-24 NOTE — Therapy (Signed)
[facility] [address], [place], [place], [address] Phone: [phone]   Fax:  [phone]  Physical Therapy Treatment/Re-evaluation  Patient Details  Name: Grant Taylor MRN: [id number] Date of Birth: 1945-04-04 Referring Provider (PT): [name], MD   Encounter Date: 02/24/2018  PT End of Session - 02/24/18 1356    Visit Number  15    Number of Visits  30    Date for PT Re-Evaluation  04/29/18    Authorization Type  VA approved 15 visits max + 15 more per Effie Berkshire 01/27/18    PT Start Time  1400    PT Stop Time  1440    PT Time Calculation (min)  40 min    Activity Tolerance  Patient tolerated treatment well;No increased pain    Behavior During Therapy  [facility] for tasks assessed/performed       History reviewed. No pertinent past medical history.  History reviewed. No pertinent surgical history.  There were no vitals filed for this visit.      [facility] PT Assessment - 02/24/18 0001      Assessment   Medical Diagnosis  Lt knee pain     Referring Provider (PT)  [name], MD    Onset Date/Surgical Date  --   worse in the past few months   Next MD Visit  not scheduled       Precautions   Precautions  Fall      Balance Screen   Has the patient fallen in the past 6 months  No    Has the patient had a decrease in activity level because of a fear of falling?   No    Is the patient reluctant to leave their home because of a fear of falling?   No      Home Environment   Additional Comments  uses forearm crutch      Cognition   Overall Cognitive Status  Within Functional Limits for tasks assessed      AROM   Right Hip Flexion  90    Left Hip Flexion  100    Left Ankle Dorsiflexion  -5      Strength   Right Hip Flexion  4/5    Right Hip Extension  4/5    Right Hip External Rotation   4/5    Right Hip Internal Rotation  4/5    Right Hip ABduction  4-/5    Right Hip ADduction  4/5     Left Hip Flexion  4-/5    Left Hip Extension  3-/5    Right Knee Flexion  4/5    Right Knee Extension  4/5    Left Knee Flexion  4-/5    Left Knee Extension  4-/5      Transfers   Comments  heavy use of UE during sit to stand transitions      Ambulation/Gait   Gait Comments  patient has to flex left hip excessively high to clear left foot; increased knee hyperextension on the Lt                    [facility] Adult PT Treatment/Exercise - 02/24/18 0001      Knee/Hip Exercises: Standing   Other Standing Knee Exercises  Lt hip flexion 2# ankle weights 2x15 reps, BUE support     Other Standing Knee Exercises  weight shifting Lt/Rt with therapist tactile cues to decrease trunk lateral flexion x20 reps  Manual Therapy   Joint Mobilization  Lt talocrural distraction; Lt AP talocrural joint mobilization; Lt medial subtalar joint mobilization Grade III-IV for all     Passive ROM  Lt gastroc/soleus stretch 5x20 sec       Ankle Exercises: Stretches   Soleus Stretch  3 reps;30 seconds   Lt   Gastroc Stretch  3 reps;30 seconds   Lt            PT Education - 02/24/18 1558    Education Details  POC moving forward; updated and reviewed HEP    Person(s) Educated  Patient    Methods  Explanation;Handout;Demonstration    Comprehension  Verbalized understanding;Returned demonstration       PT Short Term Goals - 02/24/18 1556      PT SHORT TERM GOAL #1   Title  The patient will demonstrate compliance with intial home ex program     Status  Achieved      PT SHORT TERM GOAL #2   Title  The patient will demonstrate good body mechanics with rolling to get in/out of bed to relieve back pain with this activity    Status  Achieved      PT SHORT TERM GOAL #3   Title  The patient will be able to walk 150 feet with most appropriate assistive device  needed for doctor's appointments and grocery shopping    Baseline  100 yards    Status  Achieved      PT SHORT TERM GOAL #4    Title  Left knee flexion ROM improved to 95 degrees needed for greater ease with sit to stand and negatiating curbs    Status  Achieved        PT Long Term Goals - 02/24/18 1431      PT LONG TERM GOAL #1   Title  The patient will be independent with safe self progression of HEP needed for further strengthening     Time  8    Period  Weeks    Status  On-going      PT LONG TERM GOAL #2   Title  The patient will report back and hip pain improved by 30% with usual ADLs    Time  8    Period  Weeks    Status  On-going      PT LONG TERM GOAL #3   Title  The patient will be able to ambulate with most appropriate assistive device 300 feet    Status  Achieved      PT LONG TERM GOAL #4   Title  The patient will have improved left hip strength to 4-/5 needed to lift his leg in/out of the car and for using the clutch     Period  Weeks    Status  On-going      PT LONG TERM GOAL #5   Title  The patient will have improved knee strength to grossly 4/5 with greater ease with sit to stand and negotiating curbs    Time  8    Period  Weeks    Status  On-going      PT LONG TERM GOAL #6   Title  Timed up and go gait speed improved to 20 sec for safety with crossing parking lots    Time  8    Period  Weeks            Plan - 02/24/18 1441    Clinical Impression Statement  Pt was recently measured for his KAFO and is awaiting its arrival in the upcoming weeks. He feels that his LE strength and general mobility has greatly improved, noting that he is able to get in/out of the shower without the need for UE assistance with his LLE. Pt continues to have limitations in Lt ankle flexibility, lacking approximately 5 deg from neutral at this time. Prior to recent visits, pt's back pain has limited his participation in standing therex. Due to improvements in this, he was able to complete standing weight shift and several stretches were introduced in weight bearing this visit. Pt does have remaining  limitations in LE flexibility, LE strength and general mobility and would benefit from preparation and safe transition into ambulation with his KAFO. This will improve his independence and safety with daily activity.     Rehab Potential  Fair    PT Frequency  1x / week    PT Duration  Other (comment)   per VA approval period   PT Treatment/Interventions  ADLs/Self Care Home Management;Therapeutic exercise;Therapeutic activities;Neuromuscular re-education;Patient/family education;Manual techniques;Taping    PT Next Visit Plan  standing ex as able; possible d/n; core and LE strengthening; passive LE ROM;  seated resisted trunk extension black band;  hold on Nu-Step (sometimes bothers his back)    PT Home Exercise Plan   Access Code: [email or web address]     Consulted and Agree with Plan of Care  Patient       Patient will benefit from skilled therapeutic intervention in order to improve the following deficits and impairments:  Abnormal gait, Pain, Decreased activity tolerance, Decreased endurance, Decreased strength, Impaired perceived functional ability, Difficulty walking, Decreased balance, Decreased range of motion  Visit Diagnosis: Muscle weakness (generalized)  Pain in left hip  Foot drop, left  Other abnormalities of gait and mobility     Problem List There are no active problems to display for this patient.   4:02 PM,02/24/18 Sherol Dade PT, DPT [place] [facility] [place] [address], [place], [place], [address] Phone: [phone]   Fax:  [phone]  Name: Grant Taylor MRN: [id number] Date of Birth: 12/07/1945

## 2018-02-24 NOTE — Patient Instructions (Signed)
Access Code: [email or web address]  URL: [email or web address]  Date: 02/24/2018  Prepared by: Sherol Dade   Exercises  Seated Gastroc Stretch with Strap - 3 reps - 1 sets - 30 hold - 5x daily - 7x weekly  Supine Isometric Hip Adduction with Pillow at Knees - 10 reps - 1 sets - 1x daily - 7x weekly  Clamshell - 10 reps - 1 sets - 1x daily - 7x weekly  Supine Hamstring Stretch with Strap - 10 reps - 3 sets - 1x daily - 7x weekly  Standing Soleus Stretch - 4 reps - 30 hold - 4x daily - 7x weekly  Standing Gastroc Stretch - 4 reps - 30 hold - 4x daily - 7x weekly    [facility] [address] [place], [place] [address] Phone # [phone] Fax [phone]

## 2018-03-03 ENCOUNTER — Ambulatory Visit: Payer: No Typology Code available for payment source | Admitting: Physical Therapy

## 2018-03-10 ENCOUNTER — Encounter: Payer: Self-pay | Admitting: Physical Therapy

## 2018-03-10 ENCOUNTER — Ambulatory Visit: Payer: No Typology Code available for payment source | Attending: Family Medicine | Admitting: Physical Therapy

## 2018-03-10 DIAGNOSIS — M6281 Muscle weakness (generalized): Secondary | ICD-10-CM | POA: Diagnosis present

## 2018-03-10 DIAGNOSIS — M25552 Pain in left hip: Secondary | ICD-10-CM | POA: Diagnosis present

## 2018-03-10 DIAGNOSIS — R2689 Other abnormalities of gait and mobility: Secondary | ICD-10-CM | POA: Insufficient documentation

## 2018-03-10 DIAGNOSIS — M21372 Foot drop, left foot: Secondary | ICD-10-CM | POA: Insufficient documentation

## 2018-03-10 NOTE — Therapy (Signed)
[facility] [address] [place], [place], [address] Phone: [phone]   Fax:  [phone]  Physical Therapy Treatment  Patient Details  Name: Grant Taylor MRN: [id number] Date of Birth: March 30, 1945 Referring Provider (PT): [name], MD   Encounter Date: 03/10/2018  PT End of Session - 03/10/18 1711    Visit Number  16    Number of Visits  30    Date for PT Re-Evaluation  04/29/18    Authorization Type  VA approved 15 visits max + 15 more per Effie Berkshire 01/27/18    PT Start Time  1400    PT Stop Time  1443    PT Time Calculation (min)  43 min    Activity Tolerance  Patient tolerated treatment well;No increased pain    Behavior During Therapy  [facility] for tasks assessed/performed       History reviewed. No pertinent past medical history.  History reviewed. No pertinent surgical history.  There were no vitals filed for this visit.  Subjective Assessment - 03/10/18 1405    Subjective  Pt states that he has not had any accupuncture lately. No other issues with his leg at the moment.     Pertinent History  Cervical myelopathy with fusion; DDD; PTSD; HTN; LBP; left hip pain ;  Hammer toes contracted.      How long can you stand comfortably?  I walked 20 min from the [place] parking lot into the building    Currently in Pain?  Yes   neck pain, and Lt hip (no rating given)                       [facility] Adult PT Treatment/Exercise - 03/10/18 0001      Ambulation/Gait   Gait Comments  Standing lateral weight shift BUE support on foam pas x15 reps, no UE support on foam pas x12 reps      Lumbar Exercises: Standing   Heel Raises  --   x30 reps    Heel Raises Limitations  foam pad       Lumbar Exercises: Seated   Other Seated Lumbar Exercises  trunk rotation cone reach across body 2x8 reps each direction (no UE support)      Knee/Hip Exercises: Stretches   Hip Flexor Stretch  Left;3 reps;20 seconds      Knee/Hip Exercises: Standing   Knee Flexion  AAROM;Left;1 set;10 reps    Knee Flexion Limitations  cues to maintain upright posture     Hip Abduction  Left;AAROM;2 sets;5 reps   2 finger support      Knee/Hip Exercises: Seated   Heel Slides  Left;AROM;1 set   x30 reps      Manual Therapy   Joint Mobilization  Lt hip distraction grade III-IV x2 bouts     Passive ROM  Lt hip flexion, abduction x5 reps each 20 sec             PT Education - 03/10/18 1711    Education Details  technique with therex    Person(s) Educated  Patient    Methods  Explanation    Comprehension  Verbalized understanding       PT Short Term Goals - 02/24/18 1556      PT SHORT TERM GOAL #1   Title  The patient will demonstrate compliance with intial home ex program     Status  Achieved      PT SHORT TERM GOAL #  2   Title  The patient will demonstrate good body mechanics with rolling to get in/out of bed to relieve back pain with this activity    Status  Achieved      PT SHORT TERM GOAL #3   Title  The patient will be able to walk 150 feet with most appropriate assistive device  needed for doctor's appointments and grocery shopping    Baseline  100 yards    Status  Achieved      PT SHORT TERM GOAL #4   Title  Left knee flexion ROM improved to 95 degrees needed for greater ease with sit to stand and negatiating curbs    Status  Achieved        PT Long Term Goals - 02/24/18 1431      PT LONG TERM GOAL #1   Title  The patient will be independent with safe self progression of HEP needed for further strengthening     Time  8    Period  Weeks    Status  On-going      PT LONG TERM GOAL #2   Title  The patient will report back and hip pain improved by 30% with usual ADLs    Time  8    Period  Weeks    Status  On-going      PT LONG TERM GOAL #3   Title  The patient will be able to ambulate with most appropriate assistive device 300 feet    Status  Achieved      PT LONG TERM GOAL #4    Title  The patient will have improved left hip strength to 4-/5 needed to lift his leg in/out of the car and for using the clutch     Period  Weeks    Status  On-going      PT LONG TERM GOAL #5   Title  The patient will have improved knee strength to grossly 4/5 with greater ease with sit to stand and negotiating curbs    Time  8    Period  Weeks    Status  On-going      PT LONG TERM GOAL #6   Title  Timed up and go gait speed improved to 20 sec for safety with crossing parking lots    Time  8    Period  Weeks            Plan - 03/10/18 1712    Clinical Impression Statement  Pt arrived this visit with improved foot clearance and Lt LE advancement compared to previous sessions. He also notes minimal to no toe drag when ambulating around his home. Session focused on LE/trunk strength progressions as well as hip flexibility to promote improved posture and LE mechanics. Pt denied increase in hip pain end of today's session. Wil continue to follow KAFO fabrication and progress LE strength, proprioception and endurance as able.     Rehab Potential  Fair    PT Frequency  1x / week    PT Duration  Other (comment)   per VA approval period   PT Treatment/Interventions  ADLs/Self Care Home Management;Therapeutic exercise;Therapeutic activities;Neuromuscular re-education;Patient/family education;Manual techniques;Taping    PT Next Visit Plan  standing ex as able; possible d/n gastroc?; core and LE strengthening; passive LE ROM;  seated resisted trunk extension black band;  hold on Nu-Step (sometimes bothers his back)    PT Home Exercise Plan   Access Code: [email or web address]  Consulted and Agree with Plan of Care  Patient       Patient will benefit from skilled therapeutic intervention in order to improve the following deficits and impairments:  Abnormal gait, Pain, Decreased activity tolerance, Decreased endurance, Decreased strength, Impaired perceived functional ability, Difficulty walking,  Decreased balance, Decreased range of motion  Visit Diagnosis: Muscle weakness (generalized)  Pain in left hip  Foot drop, left  Other abnormalities of gait and mobility     Problem List There are no active problems to display for this patient.    5:18 PM,03/10/18 [place], [place] [facility] [place]  [facility] [address], [place], [place], [address] Phone: [phone]   Fax:  [phone]  Name: Halen Antenucci MRN: [id number] Date of Birth: [birth year]

## 2018-03-17 ENCOUNTER — Ambulatory Visit: Payer: No Typology Code available for payment source | Admitting: Physical Therapy

## 2018-03-24 ENCOUNTER — Encounter: Payer: Self-pay | Admitting: Physical Therapy

## 2018-03-24 ENCOUNTER — Ambulatory Visit: Payer: No Typology Code available for payment source | Admitting: Physical Therapy

## 2018-03-24 DIAGNOSIS — M6281 Muscle weakness (generalized): Secondary | ICD-10-CM

## 2018-03-24 DIAGNOSIS — M25552 Pain in left hip: Secondary | ICD-10-CM

## 2018-03-24 DIAGNOSIS — M21372 Foot drop, left foot: Secondary | ICD-10-CM

## 2018-03-24 DIAGNOSIS — R2689 Other abnormalities of gait and mobility: Secondary | ICD-10-CM

## 2018-03-24 NOTE — Patient Instructions (Signed)
Access Code: [email or web address]  URL: [email or web address]  Date: 03/24/2018  Prepared by: Sherol Dade   Exercises  Clamshell - 10 reps - 1 sets - 1x daily - 7x weekly  Standing Soleus Stretch - 4 reps - 30 hold - 4x daily - 7x weekly  Standing Gastroc Stretch - 4 reps - 30 hold - 4x daily - 7x weekly  Supine March - 10 reps - 3 sets - 1x daily - 7x weekly  Seated Gastroc Stretch with Strap - 3 reps - 1 sets - 30 hold - 5x daily - 7x weekly   [facility] [address], [place], [place] [address] Phone # [phone] Fax [phone]

## 2018-03-24 NOTE — Therapy (Signed)
[facility] [address], [place], [place], [address] Phone: [phone]   Fax:  [phone]  Physical Therapy Treatment  Patient Details  Name: Grant Taylor MRN: [id number] Date of Birth: 24-Oct-1945 Referring Provider (PT): [name], MD   Encounter Date: 03/24/2018  PT End of Session - 03/24/18 1443    Visit Number  17    Number of Visits  30    Date for PT Re-Evaluation  04/29/18    Authorization Type  VA approved 15 visits max + 15 more per Effie Berkshire 01/27/18    PT Start Time  1400    PT Stop Time  1441    PT Time Calculation (min)  41 min    Activity Tolerance  Patient tolerated treatment well;No increased pain    Behavior During Therapy  [facility] for tasks assessed/performed       History reviewed. No pertinent past medical history.  History reviewed. No pertinent surgical history.  There were no vitals filed for this visit.  Subjective Assessment - 03/24/18 1403    Subjective  Pt states things are going well. He has not heard back from the orthotist.     Pertinent History  Cervical myelopathy with fusion; DDD; PTSD; HTN; LBP; left hip pain ;  Hammer toes contracted.      How long can you stand comfortably?  I walked 20 min from the [place] parking lot into the building    Currently in Pain?  Other (Comment)   none noted                       [facility] Adult PT Treatment/Exercise - 03/24/18 0001      Lumbar Exercises: Seated   Other Seated Lumbar Exercises  trunk rotation cone reach across body 2x6 reps each direction (1 UE support); cone reach across body 2x6 reps without UE support       Knee/Hip Exercises: Stretches   Piriformis Stretch  2 reps;Left;30 seconds    Piriformis Stretch Limitations  seated       Knee/Hip Exercises: Standing   Other Standing Knee Exercises  weight shifting forward/back in partial tandem x10 reps each LE forward; lateral weight shift       Knee/Hip Exercises:  Supine   Straight Leg Raises  Right;Left;3 sets;5 reps    Straight Leg Raises Limitations  cues required     Other Supine Knee/Hip Exercises  hooklying hip flexion isometric 10x5 sec hold each LE     Other Supine Knee/Hip Exercises  hooklying marching 2x10 reps       Knee/Hip Exercises: Sidelying   Clams  x10 reps each side (Rt- green decreased to red; Lt- red  decreased to yellow)             PT Education - 03/24/18 1410    Education Details  technique with therex; updated HEP    Person(s) Educated  Patient    Methods  Explanation;Handout;Verbal cues    Comprehension  Verbalized understanding;Returned demonstration       PT Short Term Goals - 02/24/18 1556      PT SHORT TERM GOAL #1   Title  The patient will demonstrate compliance with intial home ex program     Status  Achieved      PT SHORT TERM GOAL #2   Title  The patient will demonstrate good body mechanics with rolling to get in/out of bed to relieve back pain with this activity  Status  Achieved      PT SHORT TERM GOAL #3   Title  The patient will be able to walk 150 feet with most appropriate assistive device  needed for doctor's appointments and grocery shopping    Baseline  100 yards    Status  Achieved      PT SHORT TERM GOAL #4   Title  Left knee flexion ROM improved to 95 degrees needed for greater ease with sit to stand and negatiating curbs    Status  Achieved        PT Long Term Goals - 02/24/18 1431      PT LONG TERM GOAL #1   Title  The patient will be independent with safe self progression of HEP needed for further strengthening     Time  8    Period  Weeks    Status  On-going      PT LONG TERM GOAL #2   Title  The patient will report back and hip pain improved by 30% with usual ADLs    Time  8    Period  Weeks    Status  On-going      PT LONG TERM GOAL #3   Title  The patient will be able to ambulate with most appropriate assistive device 300 feet    Status  Achieved      PT LONG  TERM GOAL #4   Title  The patient will have improved left hip strength to 4-/5 needed to lift his leg in/out of the car and for using the clutch     Period  Weeks    Status  On-going      PT LONG TERM GOAL #5   Title  The patient will have improved knee strength to grossly 4/5 with greater ease with sit to stand and negotiating curbs    Time  8    Period  Weeks    Status  On-going      PT LONG TERM GOAL #6   Title  Timed up and go gait speed improved to 20 sec for safety with crossing parking lots    Time  8    Period  Weeks            Plan - 03/24/18 1444    Clinical Impression Statement  Today's session focused on increasing hip flexor strength in supine. Pt was able to maintain Lt hooklying hip flexion with manual resistance. Limited standing activity due to lateral knee shift during hyperextension, however he was able to complete slow weight shifting without difficulty. Pt has not been completing his HEP and therapist discussed the importance of doing this daily in order to make progress towards his functional goals. Several updates were made and he demonstrated understanding of the technique. Will continue with current POC.     Rehab Potential  Fair    PT Frequency  1x / week    PT Duration  Other (comment)   per VA approval period   PT Treatment/Interventions  ADLs/Self Care Home Management;Therapeutic exercise;Therapeutic activities;Neuromuscular re-education;Patient/family education;Manual techniques;Taping    PT Next Visit Plan  f/u on HEP; standing ex as able; possible d/n gastroc?; core and LE strengthening; passive LE ROM;  seated resisted trunk extension black band;  hold on Nu-Step (sometimes bothers his back)    PT Home Exercise Plan   Access Code: [email or web address]     Consulted and Agree with Plan of Care  Patient  Patient will benefit from skilled therapeutic intervention in order to improve the following deficits and impairments:  Abnormal gait, Pain, Decreased  activity tolerance, Decreased endurance, Decreased strength, Impaired perceived functional ability, Difficulty walking, Decreased balance, Decreased range of motion  Visit Diagnosis: Muscle weakness (generalized)  Pain in left hip  Foot drop, left  Other abnormalities of gait and mobility     Problem List There are no active problems to display for this patient.   2:50 PM,03/24/18 [place], [place] at [place]  [facility] [address] [place], [place], [address] Phone: [phone]   Fax:  [phone]  Name: Grant Taylor MRN: [id number] Date of Birth: 14-Aug-1945

## 2018-03-31 ENCOUNTER — Encounter: Payer: Self-pay | Admitting: Physical Therapy

## 2018-03-31 ENCOUNTER — Ambulatory Visit: Payer: No Typology Code available for payment source | Attending: Family Medicine | Admitting: Physical Therapy

## 2018-03-31 DIAGNOSIS — M6281 Muscle weakness (generalized): Secondary | ICD-10-CM | POA: Insufficient documentation

## 2018-03-31 DIAGNOSIS — M25552 Pain in left hip: Secondary | ICD-10-CM | POA: Insufficient documentation

## 2018-03-31 DIAGNOSIS — R2689 Other abnormalities of gait and mobility: Secondary | ICD-10-CM | POA: Insufficient documentation

## 2018-03-31 DIAGNOSIS — M21372 Foot drop, left foot: Secondary | ICD-10-CM | POA: Insufficient documentation

## 2018-03-31 NOTE — Therapy (Addendum)
[facility] [address], [place], [place], [address] Phone: [phone]   Fax:  [phone]  Physical Therapy Treatment/Discharge  Patient Details  Name: Grant Taylor MRN: [id number] Date of Birth: 14-Jul-1945 Referring Provider (PT): [name], MD   Encounter Date: 03/31/2018  PT End of Session - 03/31/18 1413    Visit Number  18    Number of Visits  30    Date for PT Re-Evaluation  04/29/18    Authorization Type  VA approved 15 visits max + 15 more per Effie Berkshire 01/27/18    PT Start Time  1400    PT Stop Time  7591    PT Time Calculation (min)  42 min    Activity Tolerance  Patient tolerated treatment well;No increased pain    Behavior During Therapy  [facility] for tasks assessed/performed       History reviewed. No pertinent past medical history.  History reviewed. No pertinent surgical history.  There were no vitals filed for this visit.  Subjective Assessment - 03/31/18 1404    Subjective  Pt reports that things are going well. He had acupuncture but this was only short term relief. He has his Lt hip pain which was worse when he woke up the day after his last session. He thinks maybe one of the stretches made it worse.     Pertinent History  Cervical myelopathy with fusion; DDD; PTSD; HTN; LBP; left hip pain ;  Hammer toes contracted.      How long can you stand comfortably?  I walked 20 min from the [place] parking lot into the building    Currently in Pain?  Yes    Pain Score  7     Pain Location  Hip    Pain Orientation  Left    Pain Descriptors / Indicators  Aching    Pain Type  Chronic pain    Pain Radiating Towards  none noted     Pain Onset  More than a month ago    Pain Frequency  Constant    Aggravating Factors   standing and walking     Pain Relieving Factors  resting     Effect of Pain on Daily Activities  limited activity participation                       [facility]  Treatment/Exercise - 03/31/18 0001      Self-Care   Self-Care  Posture    Posture  mirror feedback for proper posture throughout the day      Lumbar Exercises: Seated   Other Seated Lumbar Exercises  trunk rotation x10 reps holding orange weighted ball, x10 reps cone tap with blue weighted ball    Other Seated Lumbar Exercises  pelvic tilts anterior/posterior x20 reps       Knee/Hip Exercises: Supine   Straight Leg Raises  Strengthening;Both;2 sets;10 reps      Knee/Hip Exercises: Sidelying   Hip ABduction  Strengthening;1 set;Both;10 reps    Hip ABduction Limitations  tactile/verbal cues for technique, limited activation on Lt       Knee/Hip Exercises: Prone   Hamstring Curl  5 reps;3 sets    Hamstring Curl Limitations  active assist on Lt       Modalities   Modalities  Moist Heat      Moist Heat Therapy   Number Minutes Moist Heat  --   during supine therex portion  Moist Heat Location  Hip   Lt hip/buttock region     Manual Therapy   Passive ROM  Lt and Rt quadriceps stretch in prone 3x20 sec; Lt hip flexion PROM 4x10 sec hold; Lt hip abduction/ER 2x20 sec              PT Education - 03/31/18 1413    Education Details  technique with therex    Person(s) Educated  Patient    Methods  Explanation;Verbal cues    Comprehension  Verbalized understanding;Returned demonstration       PT Short Term Goals - 03/31/18 1414      PT SHORT TERM GOAL #1   Title  The patient will demonstrate compliance with intial home ex program     Status  Achieved      PT SHORT TERM GOAL #2   Title  The patient will demonstrate good body mechanics with rolling to get in/out of bed to relieve back pain with this activity    Status  Achieved      PT SHORT TERM GOAL #3   Title  The patient will be able to walk 150 feet with most appropriate assistive device  needed for doctor's appointments and grocery shopping    Baseline  100 yards    Status  Achieved      PT SHORT TERM GOAL #4    Title  Left knee flexion ROM improved to 95 degrees needed for greater ease with sit to stand and negatiating curbs    Status  Achieved        PT Long Term Goals - 03/31/18 1414      PT LONG TERM GOAL #1   Title  The patient will be independent with safe self progression of HEP needed for further strengthening     Time  8    Period  Weeks    Status  On-going      PT LONG TERM GOAL #2   Title  The patient will report back and hip pain improved by 30% with usual ADLs    Time  8    Period  Weeks    Status  On-going      PT LONG TERM GOAL #3   Title  The patient will be able to ambulate with most appropriate assistive device 300 feet    Baseline  Lt knee recurvatum noted, steppage gait using 1 lofstrand crutch    Status  Achieved      PT LONG TERM GOAL #4   Title  The patient will have improved left hip strength to 4-/5 needed to lift his leg in/out of the car and for using the clutch     Period  Weeks    Status  On-going      PT LONG TERM GOAL #5   Title  The patient will have improved knee strength to grossly 4/5 with greater ease with sit to stand and negotiating curbs    Time  8    Period  Weeks    Status  On-going      PT LONG TERM GOAL #6   Title  Timed up and go gait speed improved to 20 sec for safety with crossing parking lots    Time  8    Period  Weeks            Plan - 03/31/18 1444    Clinical Impression Statement  Pt reported increase in HEP adherence following last session. Introduced more  hip strengthening exercises on the mat table to avoid unnecessary strain on the Lt knee in standing. Pt reported increase in Lt hip discomfort over the weekend however he is unsure of specific causes. Therapist educated pt on posture correction with both verbal and visual feedback. Pt was able to complete all of today's exercises without increase in Lt hip pain and had improved understanding of posture control end of today's session. He should be following up with the  orthoptist in the next 2 weeks.     Rehab Potential  Fair    PT Frequency  1x / week    PT Duration  Other (comment)   per VA approval period   PT Treatment/Interventions  ADLs/Self Care Home Management;Therapeutic exercise;Therapeutic activities;Neuromuscular re-education;Patient/family education;Manual techniques;Taping    PT Next Visit Plan  standing ex as able; possible d/n gastroc?; core and LE strengthening; passive LE ROM;  seated resisted trunk extension black band;  hold on Nu-Step (sometimes bothers his back)    PT Home Exercise Plan   Access Code: [email or web address]     Consulted and Agree with Plan of Care  Patient       Patient will benefit from skilled therapeutic intervention in order to improve the following deficits and impairments:  Abnormal gait, Pain, Decreased activity tolerance, Decreased endurance, Decreased strength, Impaired perceived functional ability, Difficulty walking, Decreased balance, Decreased range of motion  Visit Diagnosis: Muscle weakness (generalized)  Pain in left hip  Foot drop, left  Other abnormalities of gait and mobility     Problem List There are no active problems to display for this patient.   3:03 PM,03/31/18 [place], [place] [facility] [address] [place], [place], [address] Phone: [phone]   Fax:  [phone]  Name: Grant Taylor MRN: [id number] Date of Birth: 10/05/1945    *addendum to resolve episode of care and d/c pt from PT.   [place]  Visits from Start of Care: 18  Current functional level related to goals / functional outcomes: See above for more details    Remaining deficits: See above for more details    Education / Equipment: See above for more details   Plan: Patient agrees to discharge.  Patient goals were partially met. Patient is being  discharged due to not returning since the last visit.  ?????     9:15 AM,09/22/18 Sherol Dade PT, [place] at [place]

## 2018-04-07 ENCOUNTER — Ambulatory Visit: Payer: No Typology Code available for payment source | Admitting: Physical Therapy

## 2019-11-23 ENCOUNTER — Encounter: Payer: Self-pay | Admitting: Physical Medicine & Rehabilitation

## 2019-12-19 ENCOUNTER — Encounter
Payer: No Typology Code available for payment source | Attending: Physical Medicine & Rehabilitation | Admitting: Physical Medicine & Rehabilitation

## 2019-12-19 ENCOUNTER — Encounter: Payer: Self-pay | Admitting: Physical Medicine & Rehabilitation

## 2019-12-19 ENCOUNTER — Other Ambulatory Visit: Payer: Self-pay

## 2019-12-19 VITALS — BP 158/109 | HR 81 | Temp 98.9°F | Ht 67.0 in | Wt 148.0 lb

## 2019-12-19 DIAGNOSIS — M21862 Other specified acquired deformities of left lower leg: Secondary | ICD-10-CM | POA: Diagnosis present

## 2019-12-19 NOTE — Progress Notes (Signed)
Subjective:    Patient ID: Grant Taylor, male    DOB: 1945/02/07, 74 y.o.   MRN: [id number]  HPI Left hip pain   Left hip pain exacerbated by standing and walking.  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 He had some improvement postoperatively but has had some chronic left lower extremity weakness. In addition the patient has had chronic urinary urgency since the onset of cervical myelopathy. The patient has tried left KAFO with electro assist knee joint. He does not wear the KAFO very often secondary to its bulk and the fact that it forces him to walk very erect. He subsequently developed posterior hip pain which has been going on for greater than 6 months. The patient has not had any recent physical therapy. He has tried some analgesic medications. He has not had any type of back or hip injections  No numbnes or tingling in the left leg  No hx of lumbar surgery or hip surgery Hx of cervical lami, 1991, 2007.  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 General activity 9 Relation with others 3 Enjoyment of life 8 What TIME of day is your pain at its worst? morning  and night Sleep (in general) Poor  Pain is worse with: walking, standing and some activites Pain improves with: medication and injections Relief from Meds: 4  walk with assistance use a cane how many minutes can you walk? 10 mins ability to climb steps?  no do you drive?  yes transfers alone Do you have any goals in this area?  yes  disabled:  date disabled 06/2004 I need assistance with the following:  household duties and shopping Do you have any goals in this area?  yes  bladder control problems bowel control problems weakness tingling trouble walking spasms depression anxiety suicidal thoughts  New Patient  New Patient    No family history on file. Social History   Socioeconomic History  . Marital status: Married    Spouse name: Not on file  . Number of children: Not on file  . Years of education: Not on file  . Highest education level: Not on file  Occupational History  . Not on file  Tobacco Use  . Smoking status: Not on file  Substance and Sexual Activity  . Alcohol use: Not on file  . Drug use: Not on file  . Sexual activity: Not on file  Other Topics Concern  . Not on file  Social History Narrative  . Not on file   Social Determinants of Health   Financial Resource Strain:   . Difficulty of Paying Living Expenses: Not on file  Food Insecurity:   . Worried About Charity fundraiser in the Last Year: Not on file  . Ran Out of Food in the Last Year: Not on file  Transportation Needs:   . Lack of Transportation (Medical): Not on file  . Lack of Transportation (Non-Medical): Not on file  Physical Activity:   . Days of Exercise per Week: Not on file  . Minutes  of Exercise per Session: Not on file  Stress:   . Feeling of Stress : Not on file  Social Connections:   . Frequency of Communication with Friends and Family: Not on file  . Frequency of Social Gatherings with Friends and Family: Not on file  . Attends Religious Services: Not on file  . Active Member of Clubs or Organizations: Not on file  . Attends Archivist Meetings: Not on file  . Marital Status: Not on file   No past surgical history on file. No past medical history on file. BP (!) 158/109   Pulse 81   Temp 98.9 F (37.2 C)   Ht 5\' 7"  (1.702 m)   Wt 148 lb (67.1 kg)   SpO2 97%   BMI 23.18 kg/m   Opioid  Risk Score:   Fall Risk Score:  `1  Depression screen PHQ 2/9  No flowsheet data found. Review of Systems  Gastrointestinal: Positive for constipation.  Genitourinary: Positive for frequency and urgency.  Musculoskeletal: Positive for arthralgias, back pain, gait problem and neck pain.  All other systems reviewed and are negative.      Objective:   Physical Exam Vitals and nursing note reviewed.  Constitutional:      General: He is not in acute distress.    Appearance: He is normal weight.  HENT:     Head: Normocephalic and atraumatic.  Eyes:     Extraocular Movements: Extraocular movements intact.     Conjunctiva/sclera: Conjunctivae normal.     Pupils: Pupils are equal, round, and reactive to light.  Musculoskeletal:     Comments: Patient has no tenderness along the hip negative pelvic compression test positive Faber's on the left side and the PSIS area there is a crossed Faber's as well There is tenderness over the left PSIS area No pain with left hip internal and external rotation.  Skin:    General: Skin is warm and dry.  Neurological:     Mental Status: He is alert and oriented to person, place, and time.     Comments: Motor strength is 4/5 bilateral deltoid by stress of grip 4/5 in the right hip flexor knee extensor ankle dorsiflexor 3 at the left hip flexor 4 - knee extensor 3 ankle dorsiflexor Gait January to her bottom left knee as well as lateral weight shift with increased varus at the left knee during weightbearing.  Psychiatric:        Mood and Affect: Mood normal.        Behavior: Behavior normal.   Deep tendon reflexes are 3+ at the left bicep tricep brachioradialis patellar 2 at the left Achilles 2 at the right biceps triceps brachioradialis 3 at the right patellar 2 at the right Achilles No evidence of clonus at the ankles        Assessment & Plan:  #1. Cervical myelopathy with residual left lower extremity weakness, he has a gait asymmetry time left  posterior hip pain. 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 Schedule for sacroiliac injection Order Swedish knee cage Will likely need physical therapy after the injection  2. 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 We will just check some x-rays as he does have some continued pain at the base of the neck

## 2019-12-19 NOTE — Patient Instructions (Signed)
Sacroiliac Joint Dysfunction  Sacroiliac joint dysfunction is a condition that causes inflammation on one or both sides of the sacroiliac (SI) joint. The SI joint connects the lower part of the spine (sacrum) with the two upper portions of the pelvis (ilium). This condition causes deep aching or burning pain in the low back. In some cases, the pain may also spread into one or both buttocks, hips, or thighs. What are the causes? This condition may be caused by:  Pregnancy. During pregnancy, extra stress is put on the SI joints because the pelvis widens.  Injury, such as: ? Injuries from car accidents. ? Sports-related injuries. ? Work-related injuries.  Having one leg that is shorter than the other.  Conditions that affect the joints, such as: ? Rheumatoid arthritis. ? Gout. ? Psoriatic arthritis. ? Joint infection (septic arthritis). Sometimes, the cause of SI joint dysfunction is not known. What are the signs or symptoms? Symptoms of this condition include:  Aching or burning pain in the lower back. The pain may also spread to other areas, such as: ? Buttocks. ? Groin. ? Thighs.  Muscle spasms in or around the painful areas.  Increased pain when standing, walking, running, stair climbing, bending, or lifting. How is this diagnosed? This condition is diagnosed with a physical exam and medical history. During the exam, the health care provider may move one or both of your legs to different positions to check for pain. Various tests may be done to confirm the diagnosis, including:  Imaging tests to look for other causes of pain. These may include: ? MRI. ? CT scan. ? Bone scan.  Diagnostic injection. A numbing medicine is injected into the SI joint using a needle. If your pain is temporarily improved or stopped after the injection, this can indicate that SI joint dysfunction is the problem. How is this treated? Treatment depends on the cause and severity of your condition.  Treatment options may include:  Ice or heat applied to the lower back area after an injury. This may help reduce pain and muscle spasms.  Medicines to relieve pain or inflammation or to relax the muscles.  Wearing a back brace (sacroiliac brace) to help support the joint while your back is healing.  Physical therapy to increase muscle strength around the joint and flexibility at the joint. This may also involve learning proper body positions and ways of moving to relieve stress on the joint.  Direct manipulation of the SI joint.  Injections of steroid medicine into the joint to reduce pain and swelling.  Radiofrequency ablation to burn away nerves that are carrying pain messages from the joint.  Use of a device that provides electrical stimulation to help reduce pain at the joint.  Surgery to put in screws and plates that limit or prevent joint motion. This is rare. Follow these instructions at home: Medicines  Take over-the-counter and prescription medicines only as told by your health care provider.  Do not drive or use heavy machinery while taking prescription pain medicine.  If you are taking prescription pain medicine, take actions to prevent or treat constipation. Your health care provider may recommend that you: ? Drink enough fluid to keep your urine pale yellow. ? Eat foods that are high in fiber, such as fresh fruits and vegetables, whole grains, and beans. ? Limit foods that are high in fat and processed sugars, such as fried or sweet foods. ? Take an over-the-counter or prescription medicine for constipation. If you have a brace:    Wear the brace as told by your health care provider. Remove it only as told by your health care provider.  Keep the brace clean.  If the brace is not waterproof: ? Do not let it get wet. ? Cover it with a watertight covering when you take a bath or a shower. Managing pain, stiffness, and swelling      Icing can help with pain and  swelling. Heat may help with muscle tension or spasms. Ask your health care provider if you should use ice or heat.  If directed, put ice on the affected area: ? If you have a removable brace, remove it as told by your health care provider. ? Put ice in a plastic bag. ? Place a towel between your skin and the bag. ? Leave the ice on for 20 minutes, 2-3 times a day.  If directed, apply heat to the affected area. Use the heat source that your health care provider recommends, such as a moist heat pack or a heating pad. ? Place a towel between your skin and the heat source. ? Leave the heat on for 20-30 minutes. ? Remove the heat if your skin turns bright red. This is especially important if you are unable to feel pain, heat, or cold. You may have a greater risk of getting burned. General instructions  Rest as needed. Ask your health care provider what activities are safe for you.  Return to your normal activities as told by your health care provider.  Exercise as directed by your health care provider or physical therapist.  Do not use any products that contain nicotine or tobacco, such as cigarettes and e-cigarettes. These can delay bone healing. If you need help quitting, ask your health care provider.  Keep all follow-up visits as told by your health care provider. This is important. Contact a health care provider if:  Your pain is not controlled with medicine.  You have a fever.  Your pain is getting worse. Get help right away if:  You have weakness, numbness, or tingling in your legs or feet.  You lose control of your bladder or bowel. Summary  Sacroiliac joint dysfunction is a condition that causes inflammation on one or both sides of the sacroiliac (SI) joint.  This condition causes deep aching or burning pain in the low back. In some cases, the pain may also spread into one or both buttocks, hips, or thighs.  Treatment depends on the cause and severity of your condition.  It may include medicines to reduce pain and swelling or to relax muscles. This information is not intended to replace advice given to you by your health care provider. Make sure you discuss any questions you have with your health care provider. Document Revised: 09/08/2017 Document Reviewed: 02/22/2017 [employer]vier Patient Education  2020 [employer]  

## 2019-12-20 ENCOUNTER — Telehealth: Payer: Self-pay

## 2019-12-27 ENCOUNTER — Other Ambulatory Visit: Payer: Self-pay | Admitting: Physical Medicine & Rehabilitation

## 2019-12-27 ENCOUNTER — Emergency Department (HOSPITAL_COMMUNITY): Payer: No Typology Code available for payment source

## 2019-12-27 ENCOUNTER — Emergency Department (HOSPITAL_COMMUNITY)
Admission: EM | Admit: 2019-12-27 | Discharge: 2019-12-27 | Disposition: A | Discharge status: Home / Self Care | Payer: No Typology Code available for payment source | Attending: Emergency Medicine | Admitting: Emergency Medicine

## 2019-12-27 ENCOUNTER — Other Ambulatory Visit: Payer: Self-pay

## 2019-12-27 ENCOUNTER — Encounter (HOSPITAL_COMMUNITY): Payer: Self-pay | Admitting: Emergency Medicine

## 2019-12-27 ENCOUNTER — Ambulatory Visit
Admission: RE | Admit: 2019-12-27 | Discharge: 2019-12-27 | Disposition: A | Discharge status: Home / Self Care | Payer: No Typology Code available for payment source | Source: Ambulatory Visit | Attending: Physical Medicine & Rehabilitation | Admitting: Physical Medicine & Rehabilitation

## 2019-12-27 DIAGNOSIS — Z79899 Other long term (current) drug therapy: Secondary | ICD-10-CM | POA: Diagnosis not present

## 2019-12-27 DIAGNOSIS — K5901 Slow transit constipation: Secondary | ICD-10-CM | POA: Insufficient documentation

## 2019-12-27 DIAGNOSIS — M542 Cervicalgia: Secondary | ICD-10-CM

## 2019-12-27 DIAGNOSIS — R350 Frequency of micturition: Secondary | ICD-10-CM | POA: Insufficient documentation

## 2019-12-27 DIAGNOSIS — M21862 Other specified acquired deformities of left lower leg: Secondary | ICD-10-CM

## 2019-12-27 DIAGNOSIS — I10 Essential (primary) hypertension: Secondary | ICD-10-CM | POA: Insufficient documentation

## 2019-12-27 DIAGNOSIS — R109 Unspecified abdominal pain: Secondary | ICD-10-CM | POA: Diagnosis present

## 2019-12-27 DIAGNOSIS — Z87891 Personal history of nicotine dependence: Secondary | ICD-10-CM | POA: Diagnosis not present

## 2019-12-27 LAB — COMPREHENSIVE METABOLIC PANEL
ALT: 14 U/L (ref 0–44)
AST: 17 U/L (ref 15–41)
Albumin: 3.6 g/dL (ref 3.5–5.0)
Alkaline Phosphatase: 64 U/L (ref 38–126)
Anion gap: 11 (ref 5–15)
BUN: 8 mg/dL (ref 8–23)
CO2: 24 mmol/L (ref 22–32)
Calcium: 9.5 mg/dL (ref 8.9–10.3)
Chloride: 107 mmol/L (ref 98–111)
Creatinine, Ser: 1.18 mg/dL (ref 0.61–1.24)
GFR, Estimated: >60 mL/min (ref >60)
Glucose, Bld: 93 mg/dL (ref 70–99)
Potassium: 3.8 mmol/L (ref 3.5–5.1)
Sodium: 142 mmol/L (ref 135–145)
Total Bilirubin: 0.7 mg/dL (ref 0.3–1.2)
Total Protein: 7 g/dL (ref 6.5–8.1)

## 2019-12-27 LAB — CBC
HCT: 42.8 % (ref 39.0–52.0)
Hemoglobin: 14.2 g/dL (ref 13.0–17.0)
MCH: 27.6 pg (ref 26.0–34.0)
MCHC: 33.2 g/dL (ref 30.0–36.0)
MCV: 83.3 fL (ref 80.0–100.0)
Platelets: 192 10*3/uL (ref 150–400)
RBC: 5.14 MIL/uL (ref 4.22–5.81)
RDW: 13.5 % (ref 11.5–15.5)
WBC: 7.3 10*3/uL (ref 4.0–10.5)
nRBC: 0 % (ref 0.0–0.2)

## 2019-12-27 LAB — URINALYSIS, ROUTINE W REFLEX MICROSCOPIC
Bilirubin Urine: NEGATIVE
Glucose, UA: NEGATIVE mg/dL
Ketones, ur: NEGATIVE mg/dL
Leukocytes,Ua: NEGATIVE
Nitrite: NEGATIVE
Protein, ur: NEGATIVE mg/dL
Specific Gravity, Urine: 1.012 (ref 1.005–1.030)
pH: 7 (ref 5.0–8.0)

## 2019-12-27 MED ORDER — POLYETHYLENE GLYCOL 3350 17 G PO PACK: 17.0000 g | PACK | Freq: Every day | ORAL | 0 refills | Status: AC

## 2019-12-27 NOTE — ED Provider Notes (Signed)
Physical Exam  BP (!) 179/106   Pulse 75   Temp 98.2 F (36.8 C) (Oral)   Resp 16   Ht 5\' 7"  (1.702 m)   Wt 68 kg   SpO2 96%   BMI 23.49 kg/m   Physical Exam Vitals and nursing note reviewed.  Constitutional:      General: He is not in acute distress.    Appearance: He is well-developed. He is not diaphoretic.  HENT:     Head: Normocephalic and atraumatic.  Eyes:     General: No scleral icterus.    Conjunctiva/sclera: Conjunctivae normal.  Pulmonary:     Effort: Pulmonary effort is normal. No respiratory distress.  Musculoskeletal:     Cervical back: Normal range of motion.  Skin:    Findings: No rash.  Neurological:     Mental Status: He is alert.     ED Course/Procedures     Procedures  MDM   Care of patient assumed from PA Layden at 3:30 PM.  Agree with history, physical exam and plan.  See their note for further details.  Briefly, 74 y.o. male with PMH/PSH as below who presents with kidney pain and needing CT scan from urologist office.  History of overactive bladder since 2018 on oxybutynin and Myrbetriq.  Reports bilateral kidney pain since starting Tolterodine worse on the left compared to the right.  Saw his urologist today and was sent to the ED for a CT scan.  Denies any urinary symptoms.  No vomiting, fevers or chest pain. Lab work here including CBC and CMP unremarkable.  Past Medical History:  Diagnosis Date  . Chronic GERD   . Constipation   . ED (erectile dysfunction)   . History of BPH   . Hyperlipemia   . Hypertension   . Hypokalemia   . OAB (overactive bladder)   . Obstructive sleep apnea   . Positive PPD   . Vitamin B 12 deficiency    Past Surgical History:  Procedure Laterality Date  . LAMINECTOMY AND MICRODISCECTOMY LUMBAR SPINE  2007      Current Plan: Obtain urinalysis, CT renal stone study to evaluate for kidney or bladder stones or other abnormalities.   MDM/ED Course: 4:58 PM CT without any evidence of urinary tract  calculi or obstructive uropathy.  He does have a significant amount of stool compatible with constipation.  Patient states that he is concerned that his medications are causing him to be constipated.  5:19 PM UA shows few bacteria but otherwise unremarkable. This has been sent for culture. Will treat for constipation and have him follow-up with results of urine culture when it is available. Patient inquiring about changes to his medications. Told him he will need to follow-up with urology regarding this and he is agreeable to the plan. Return precautions given.  Consults: None   Significant labs/images: DG Cervical Spine 2 or 3 views  Result Date: 12/27/2019 CLINICAL DATA:  Chronic cervical neck pain of 2 laminectomies EXAM: CERVICAL SPINE - 2-3 VIEW COMPARISON:  None. FINDINGS: Anterior compression deformity of the T1 vertebral body with approximately 60% height loss which appears chronic but is technically indeterminate given lack of available prior imaging. C3-C5 anterior cervical fusion hardware without evidence of hardware loosening or fracture. There is 4 mm of C7 on T1 anterolisthesis. No prevertebral soft tissue swelling. IMPRESSION: 1. Anterior compression deformity of the T1 vertebral body with approximately 60% height loss with 4 mm of C7 on T1 anterolisthesis which appears chronic but  is technically indeterminate given lack of available prior imaging. Correlate with point tenderness and prior imaging if available. If continued clinical concern and no prior imaging available MRI of the C-spine could be utilized for further evaluation. 2. Anterior cervical fusion C3-C5 without evidence of hardware complication. Electronically Signed   By: Dahlia Bailiff MD   On: 12/27/2019 15:49   CT Renal Stone Study  Result Date: 12/27/2019 CLINICAL DATA:  Bilateral flank pain for 2 months EXAM: CT ABDOMEN AND PELVIS WITHOUT CONTRAST TECHNIQUE: Multidetector CT imaging of the abdomen and pelvis was  performed following the standard protocol without IV contrast. COMPARISON:  None. FINDINGS: Lower chest: There is bibasilar scarring and fibrosis, right greater than left. No acute pleural or parenchymal lung disease. Hepatobiliary: No focal liver abnormality is seen. No gallstones, gallbladder wall thickening, or biliary dilatation. Pancreas: Unremarkable. No pancreatic ductal dilatation or surrounding inflammatory changes. Spleen: Normal in size without focal abnormality. Adrenals/Urinary Tract: No urinary tract calculi or obstructive uropathy within either kidney. The bladder is unremarkable. The adrenals are normal. Stomach/Bowel: No bowel obstruction or ileus. Large amount of retained stool throughout the colon. No bowel wall thickening or inflammatory change. Normal appendix right lower quadrant. Vascular/Lymphatic: Aortic atherosclerosis. No enlarged abdominal or pelvic lymph nodes. Reproductive: Prostate is unremarkable. Other: No free fluid or free gas.  No abdominal wall hernia. Musculoskeletal: No acute or destructive bony lesions. There is extensive spondylosis from L2 through S1. Reconstructed images demonstrate no additional findings. IMPRESSION: 1. No urinary tract calculi or obstructive uropathy. 2. Significant retained stool throughout the colon compatible with constipation. 3. Bibasilar pulmonary fibrosis. 4.  Aortic Atherosclerosis (ICD10-I70.0). Electronically Signed   By: Randa Ngo M.D.   On: 12/27/2019 16:39    Labs Reviewed  URINALYSIS, ROUTINE W REFLEX MICROSCOPIC - Abnormal; Notable for the following components:      Result Value   APPearance CLOUDY (*)    Hgb urine dipstick SMALL (*)    Bacteria, UA FEW (*)    All other components within normal limits  URINE CULTURE  CBC  COMPREHENSIVE METABOLIC PANEL    I personally reviewed and interpreted all labs.    Patient is hemodynamically stable, in NAD, and able to ambulate in the ED. Evaluation does not show pathology that  would require ongoing emergent intervention or inpatient treatment. I explained the diagnosis to the patient. Pain has been managed and has no complaints prior to discharge. Patient is comfortable with above plan and is stable for discharge at this time. All questions were answered prior to disposition. Strict return precautions for returning to the ED were discussed. Encouraged follow up with PCP.   An After Visit Summary was printed and given to the patient.   Portions of this note were generated with L[employer]. Dictation errors may occur despite best attempts at proofreading.     Delia Heady, PA-C 12/27/19 1722    Lucrezia Starch, MD 12/28/19 [phone]

## 2019-12-27 NOTE — ED Provider Notes (Signed)
[facility] EMERGENCY DEPARTMENT Provider Note   CSN: [id number] Arrival date & time: 12/27/19  1116     History No chief complaint on file.   Grant Taylor is a 74 y.o. male with PMH/o Chronic GERD, HLD, HTN who presents for evaluation "kidney pain" and needing a CT scan. Patient reports he sees a urologist at the [facility] He has been dealing with overactive bladder since 2018. He has been on oxybutin and myrbetriq and toltoteridine to help his symptoms.  He states that since he has started the tolterodine, he has had some bilateral kidney pain, left greater than right.  He states that this has continued to persist over the last several weeks.  He also feels like his urinary frequency is getting worse.  He states he has not had any dysuria or hematuria.  He states that his urologist sent him over to get a CT scan for evaluation.  He states he occasionally will have some lower abdominal pressure.  He has had some nausea but denies any vomiting.  He has not noted any fevers, chest pain, difficulty breathing.  The history is provided by the patient.       Past Medical History:  Diagnosis Date  . Chronic GERD   . Constipation   . ED (erectile dysfunction)   . History of BPH   . Hyperlipemia   . Hypertension   . Hypokalemia   . OAB (overactive bladder)   . Obstructive sleep apnea   . Positive PPD   . Vitamin B 12 deficiency     There are no problems to display for this patient.   Past Surgical History:  Procedure Laterality Date  . LAMINECTOMY AND MICRODISCECTOMY LUMBAR SPINE  2007       History reviewed. No pertinent family history.  Social History   Tobacco Use  . Smoking status: Former Research scientist (life sciences)  . Smokeless tobacco: Never Used  Vaping Use  . Vaping Use: Former  Substance Use Topics  . Alcohol use: Not Currently  . Drug use: Not Currently    Home Medications Prior to Admission medications   Medication Sig Start Date End Date Taking?  Authorizing Provider  albuterol (VENTOLIN HFA) 108 (90 Base) MCG/ACT inhaler Inhale into the lungs every 6 (six) hours as needed for wheezing or shortness of breath.    [provider]  amLODipine-benazepril (LOTREL) 10-20 MG capsule Take 1 capsule by mouth daily.    [provider]  buPROPion (WELLBUTRIN XL) 300 MG 24 hr tablet Take by mouth daily.    [provider]  Cholecalciferol (VITAMIN D3) 10 MCG (400 UNIT) CAPS Take by mouth.    [provider]  diclofenac (VOLTAREN) 75 MG EC tablet Take 75 mg by mouth 2 (two) times daily.    [provider]  diclofenac Sodium (VOLTAREN) 1 % GEL Apply topically 4 (four) times daily.    [provider]  docusate sodium (COLACE) 100 MG capsule Take 100 mg by mouth 2 (two) times daily.    [provider]  famotidine (PEPCID) 20 MG tablet Take by mouth 2 (two) times daily.    [provider]  losartan (COZAAR) 50 MG tablet Take by mouth daily.    [provider]  mirabegron ER (MYRBETRIQ) 25 MG TB24 tablet Take 25 mg by mouth daily.    [provider]  omeprazole (PRILOSEC) 40 MG capsule Take 40 mg by mouth daily.    [provider]  potassium chloride (  KLOR-CON) 20 MEQ packet Take by mouth 2 (two) times daily.    [provider]  pravastatin (PRAVACHOL) 80 MG tablet Take 80 mg by mouth daily.    [provider]  sildenafil (VIAGRA) 100 MG tablet Take 100 mg by mouth daily as needed for erectile dysfunction.    [provider]  sodium chloride (BRONCHO SALINE) inhaler solution Take 1 spray by nebulization as needed.    [provider]    Allergies    Lisinopril, Sulfur, and Tolterodine  Review of Systems   Review of Systems  Constitutional: Negative for fever.  Respiratory: Negative for cough and shortness of breath.   Cardiovascular: Negative for chest pain.  Gastrointestinal: Negative for abdominal pain, nausea and  vomiting.  Genitourinary: Positive for flank pain and frequency. Negative for dysuria and hematuria.  Neurological: Negative for headaches.  All other systems reviewed and are negative.   Physical Exam Updated Vital Signs BP (!) 188/103   Pulse 83   Temp 98.2 F (36.8 C) (Oral)   Resp 16   Ht 5\' 7"  (1.702 m)   Wt 68 kg   SpO2 98%   BMI 23.49 kg/m   Physical Exam Vitals and nursing note reviewed.  Constitutional:      Appearance: Normal appearance. He is well-developed.  HENT:     Head: Normocephalic and atraumatic.  Eyes:     General: Lids are normal.     Conjunctiva/sclera: Conjunctivae normal.     Pupils: Pupils are equal, round, and reactive to light.  Cardiovascular:     Rate and Rhythm: Normal rate and regular rhythm.     Pulses: Normal pulses.     Heart sounds: Normal heart sounds. No murmur heard.  No friction rub. No gallop.   Pulmonary:     Effort: Pulmonary effort is normal.     Breath sounds: Normal breath sounds.     Comments: Lungs clear to auscultation bilaterally.  Symmetric chest rise.  No wheezing, rales, rhonchi. Abdominal:     Palpations: Abdomen is soft. Abdomen is not rigid.     Tenderness: There is abdominal tenderness in the suprapubic area. There is right CVA tenderness and left CVA tenderness. There is no guarding.     Comments: Abdomen soft, nondistended.  Mild suprapubic tenderness.  Bilateral CVA tenderness, left greater than right.  Musculoskeletal:        General: Normal range of motion.     Cervical back: Full passive range of motion without pain.  Skin:    General: Skin is warm and dry.     Capillary Refill: Capillary refill takes less than 2 seconds.  Neurological:     Mental Status: He is alert and oriented to person, place, and time.  Psychiatric:        Speech: Speech normal.     ED Results / Procedures / Treatments   Labs (all labs ordered are listed, but only abnormal results are displayed) Labs Reviewed  URINE CULTURE   CBC  COMPREHENSIVE METABOLIC PANEL  URINALYSIS, ROUTINE W REFLEX MICROSCOPIC    EKG None  Radiology DG Cervical Spine 2 or 3 views  Result Date: 12/27/2019 CLINICAL DATA:  Chronic cervical neck pain of 2 laminectomies EXAM: CERVICAL SPINE - 2-3 VIEW COMPARISON:  None. FINDINGS: Anterior compression deformity of the T1 vertebral body with approximately 60% height loss which appears chronic but is technically indeterminate given lack of available prior imaging. C3-C5 anterior cervical fusion hardware without evidence of hardware loosening or  fracture. There is 4 mm of C7 on T1 anterolisthesis. No prevertebral soft tissue swelling. IMPRESSION: 1. Anterior compression deformity of the T1 vertebral body with approximately 60% height loss with 4 mm of C7 on T1 anterolisthesis which appears chronic but is technically indeterminate given lack of available prior imaging. Correlate with point tenderness and prior imaging if available. If continued clinical concern and no prior imaging available MRI of the C-spine could be utilized for further evaluation. 2. Anterior cervical fusion C3-C5 without evidence of hardware complication. Electronically Signed   By: Dahlia Bailiff MD   On: 12/27/2019 15:49    Procedures Procedures (including critical care time)  Medications Ordered in ED Medications - No data to display  ED Course  I have reviewed the triage vital signs and the nursing notes.  Pertinent labs & imaging results that were available during my care of the patient were reviewed by me and considered in my medical decision making (see chart for details).    MDM Rules/Calculators/A&P                          74 year old male who presents for evaluation of kidney pain, needing a CT scan.  States he has had bilateral kidney pain for several weeks.  He has been followed by his urologist at the [place].  He also has history of overactive bladder and states that his urinary frequency is getting worse.  He  is currently taking Myrbetriq.  He states that he was sent over to get acute CT scan to evaluate for possible kidney stones versus bladder stents.  On initial arrival, he is afebrile nontoxic-appearing.  Vital signs are stable.  On exam, he has some mild suprapubic abdominal tenderness.  He also has bilateral CVA tenderness.  Will obtain urine, basic lab work to evaluate kidney function.  Will obtain imaging.  I suspect this is continued overactive bladder.  Low suspicion for kidney stone given that his pain has been ongoing for so long.  Will obtain a urine to ensure that this is not pyelonephritis.  CBC shows no leukocytosis or anemia.  CMP shows normal BUN and creatinine.  Patient signed out to [facility], PA-C pending CT scan.   Portions of this note were generated with Lobbyist. Dictation errors may occur despite best attempts at proofreading.   Final Clinical Impression(s) / ED Diagnoses Final diagnoses:  None    Rx / DC Orders ED Discharge Orders    None       Desma Mcgregor 12/27/19 1605    Tegeler, Gwenyth Allegra, MD 12/28/19 [phone]

## 2019-12-27 NOTE — ED Triage Notes (Signed)
Pt here sent over by urologist to get a CT scan with contrast of abd pelvis

## 2019-12-27 NOTE — Discharge Instructions (Addendum)
Take the MiraLAX to help with your constipation. You will need to follow-up with your urologist regarding the medications that they prescribed to you and any potential side effects. Your urine showed some bacteria. I will send it for culture, if it grows any bacteria need to be on antibiotic we will call you and let you know. Return to the ER if you start to develop worsening pain, shortness of breath, fever.

## 2019-12-27 NOTE — ED Notes (Signed)
The pt has had bi-lateral flank for 2 months [facility]  A and o x 4

## 2019-12-29 LAB — URINE CULTURE: Culture: >=100000 — AB

## 2019-12-31 ENCOUNTER — Telehealth (HOSPITAL_BASED_OUTPATIENT_CLINIC_OR_DEPARTMENT_OTHER): Payer: Self-pay | Admitting: Emergency Medicine

## 2019-12-31 NOTE — Telephone Encounter (Signed)
Post ED Visit - Positive Culture Follow-up: Unsuccessful Patient Follow-up  Culture assessed and recommendations reviewed by:  []  Elenor Quinones, Pharm.D. []  Heide Guile, Pharm.D., BCPS AQ-ID []  Parks Neptune, Pharm.D., BCPS []  Alycia Rossetti, Pharm.D., BCPS []  [place], Pharm.D., BCPS, AAHIVP []  Legrand Como, Pharm.D., BCPS, AAHIVP [x]  Duanne Limerick, PharmD []  Vincenza Hews, PharmD, BCPS  Positive urine culture  [x]  Patient discharged without antimicrobial prescription and treatment is now indicated []  Organism is resistant to prescribed ED discharge antimicrobial []  Patient with positive blood cultures   Unable to contact patient at phone number on file, letter will be sent to address on file.  Plan: Cipro 500 mg PO BID x seven days. Carmon Sails PA   [place] 12/31/2019, 3:14 PM

## 2020-01-04 ENCOUNTER — Other Ambulatory Visit: Payer: Self-pay

## 2020-01-04 ENCOUNTER — Encounter
Payer: No Typology Code available for payment source | Attending: Physical Medicine & Rehabilitation | Admitting: Physical Medicine & Rehabilitation

## 2020-01-04 ENCOUNTER — Encounter: Payer: Self-pay | Admitting: Physical Medicine & Rehabilitation

## 2020-01-04 VITALS — BP 152/99 | HR 100 | Temp 98.5°F | Ht 67.0 in | Wt 144.0 lb

## 2020-01-04 DIAGNOSIS — M961 Postlaminectomy syndrome, not elsewhere classified: Secondary | ICD-10-CM | POA: Diagnosis not present

## 2020-01-04 DIAGNOSIS — M533 Sacrococcygeal disorders, not elsewhere classified: Secondary | ICD-10-CM | POA: Diagnosis not present

## 2020-01-04 NOTE — Patient Instructions (Signed)
Sacroiliac injection was performed today. A combination of a naming medicine plus a cortisone medicine was injected. The injection was done under x-ray guidance. This procedure has been performed to help reduce low back and buttocks pain as well as potentially hip pain. The duration of this injection is variable lasting from hours to  Months. It may repeated if needed.  Based on x-ray as well as her current complaints of chronic neck pain will order MRI of the cervical spine to further evaluate the C7-T1 area

## 2020-01-04 NOTE — Progress Notes (Signed)
  PROCEDURE RECORD [facility]   Name: Grant Taylor DOB:1945/02/14 MRN: [id number]  Date:01/04/2020  Physician: Alysia Penna, MD    Nurse/CMA: Hervey Wedig, CMA   Allergies:  Allergies  Allergen Reactions  . Lisinopril   . Sulfur   . Tolterodine     Consent Signed: Yes.    Is patient diabetic? No.  CBG today?   Pregnant: No. LMP: No LMP for male patient. (age 75-55)  Anticoagulants: no Anti-inflammatory: no Antibiotics: no  Procedure: left sacroiliac steroid injection  Position: Prone Start Time: 3:46pm  End Time: 3:54pm   Fluoro Time: 31s  RN/CMA Nyeshia Mysliwiec, CMA Neita Landrigan, CMA    Time 3:10pm 3:59pm    BP 152/99 141/91    Pulse 100 100    Respirations 16 16    O2 Sat 97 96    S/S 6 6    Pain Level 7/10 6/10     D/C home with self, patient A & O X 3, D/C instructions reviewed, and sits independently.

## 2020-01-04 NOTE — Progress Notes (Signed)
Left sacroiliac injection under fluoroscopic guidance  Indication: Left Low back and buttocks pain not relieved by medication management and other conservative care.  Informed consent was obtained after describing risks and benefits of the procedure with the patient, this includes bleeding, bruising, infection, paralysis and medication side effects. The patient wishes to proceed and has given written consent. The patient was placed in a prone position. The lumbar and sacral area was marked and prepped with Betadine. A 25-gauge 1-1/2 inch needle was inserted into the skin and subcutaneous tissue and 1 mL of 1% lidocaine was injected. Then a 25-gauge 3 inch spinal needle was inserted under fluoroscopic guidance into the left sacroiliac joint. AP and lateral images were utilized. Isovue 200x0.5 mL under live fluoroscopy demonstrated no intravascular uptake. Then a solution containing one ML of 6 mg per mLbetamethasone and 2 ML of 2% lidocaine MPF was injected x1.5 mL. Patient tolerated the procedure well. Post procedure instructions were given. Please see post procedure form.

## 2020-02-06 ENCOUNTER — Other Ambulatory Visit: Payer: Self-pay

## 2020-02-06 ENCOUNTER — Encounter: Payer: Self-pay | Admitting: Physical Medicine & Rehabilitation

## 2020-02-06 ENCOUNTER — Encounter
Payer: No Typology Code available for payment source | Attending: Physical Medicine & Rehabilitation | Admitting: Physical Medicine & Rehabilitation

## 2020-02-06 VITALS — BP 144/93 | HR 102 | Temp 98.3°F | Ht 67.0 in | Wt 146.2 lb

## 2020-02-06 DIAGNOSIS — M47816 Spondylosis without myelopathy or radiculopathy, lumbar region: Secondary | ICD-10-CM | POA: Diagnosis not present

## 2020-02-06 DIAGNOSIS — M533 Sacrococcygeal disorders, not elsewhere classified: Secondary | ICD-10-CM | POA: Diagnosis not present

## 2020-02-06 NOTE — Patient Instructions (Signed)
Will do lumbar medial branch blocks for central low back pain next visit  Practice sit to stand exercise 2 sets of 10 reps near a counter

## 2020-02-06 NOTE — Progress Notes (Signed)
Subjective:    Patient ID: Grant Taylor, male    DOB: 1945/05/03, 75 y.o.   MRN: [id number]  HPI 75 year old male referred by the [facility] for the evaluation of chronic low back pain.  He has undergone left sacroiliac injection on 01/04/2020.  He has had excellent results with this.  Patient has noticed increased lumbar pain on both sides as well as right upper buttock pain.  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  He is scheduled for repeat imaging studies of his cervical spine.  He has had problems with chronic knee hyperextension the left side.  I prescribed a Swedish knee cage last visit and he now is wearing it.  He feels like his knee hyperextension has been controlled.  He does have some soreness around the straps and has been back once to the orthotist to get some additional padding.  He feels like he may need some padding below the knee as well Pain Inventory Average Pain 5 Pain Right Now 0 My pain is burning, dull and aching  In the last 24 hours, has pain interfered with the following? General activity 6 Relation with others 0 Enjoyment of life 5 What TIME of day is your pain at its worst? morning  and night Sleep (in general) Fair  Pain is worse with: walking, standing and some activites Pain improves with: rest, medication and injections Relief from Meds: 6  No family history on file. Social History   Socioeconomic History  . Marital status: Married    Spouse name: Not on file  . Number of children: Not on file  . Years of education: Not on file  . Highest education level: Not on file  Occupational History  . Not on file  Tobacco Use  . Smoking status: Former Research scientist (life sciences)  . Smokeless tobacco: Never Used  Vaping Use  . Vaping Use: Former  Substance and Sexual Activity  . Alcohol use: Not Currently  . Drug use: Not Currently  . Sexual activity:  Yes  Other Topics Concern  . Not on file  Social History Narrative  . Not on file   Social Determinants of Health   Financial Resource Strain: Not on file  Food Insecurity: Not on file  Transportation Needs: Not on file  Physical Activity: Not on file  Stress: Not on file  Social Connections: Not on file   Past Surgical History:  Procedure Laterality Date  . LAMINECTOMY AND MICRODISCECTOMY LUMBAR SPINE  2007   Past Surgical History:  Procedure Laterality Date  . LAMINECTOMY AND MICRODISCECTOMY LUMBAR SPINE  2007   Past Medical History:  Diagnosis Date  . Chronic GERD   . Constipation   . ED (erectile dysfunction)   . History of BPH   . Hyperlipemia   . Hypertension   . Hypokalemia   . OAB (overactive bladder)   . Obstructive sleep apnea   . Positive PPD   . Vitamin B 12 deficiency    BP (!) 144/93   Pulse (!) 102   Temp 98.3 F (36.8 C)   Ht 5\' 7"  (1.702 m)   Wt 146 lb 3.2 oz (66.3 kg)   SpO2 95%   BMI 22.90 kg/m   Opioid Risk Score:   Fall Risk Score:  `1  Depression screen PHQ 2/9  Depression screen [facility] 2/9 02/06/2020 12/19/2019  Decreased Interest 1 3  Down, Depressed, Hopeless  1 1  PHQ - 2 Score 2 4  Altered sleeping - 3  Tired, decreased energy - 2  Change in appetite - 3  Feeling bad or failure about yourself  - 1  Trouble concentrating - 2  Moving slowly or fidgety/restless - 0  Suicidal thoughts - 1  PHQ-9 Score - 16    Review of Systems  Constitutional: Negative.   HENT: Negative.   Eyes: Negative.   Respiratory: Negative.   Cardiovascular: Negative.   Gastrointestinal: Negative.   Endocrine: Negative.   Genitourinary: Negative.   Musculoskeletal: Positive for gait problem.       Spasm  Skin: Negative.   Allergic/Immunologic: Negative.   Neurological: Positive for weakness.  Hematological: Negative.   Psychiatric/Behavioral: Positive for dysphoric mood.  All other systems reviewed and are negative.      Objective:    Physical Exam Vitals and nursing note reviewed.  Constitutional:      Appearance: He is normal weight.  HENT:     Head: Normocephalic and atraumatic.  Eyes:     Extraocular Movements: Extraocular movements intact.     Conjunctiva/sclera: Conjunctivae normal.     Pupils: Pupils are equal, round, and reactive to light.  Musculoskeletal:        General: Tenderness present.     Right lower leg: No edema.     Left lower leg: No edema.     Comments: There is tenderness palpation left PSIS greater than right PSIS there is also some tenderness along the lumbar paraspinals bilaterally.  Negative straight leg raising Lumbar range of motion has 75% of normal flexion 25 normal extension as well as lateral bending  Neurological:     General: No focal deficit present.     Mental Status: He is alert and oriented to person, place, and time.     Comments: Motor strength is 5/5 right and 4/5 left hip flexion extensor ankle dorsiflexor  Left knee hyperextension during ambulation improved with Swedish knee cage  Psychiatric:        Mood and Affect: Mood normal.        Behavior: Behavior normal.           Assessment & Plan:  #1.  Sacroiliac pain left side improved status post sacroiliac injection 2.  Lumbar spondylosis I reviewed his MRI, he is likely more symptomatic now that the primary pain generator is under better control.  He certainly has enough pain to warrant further intervention.  We discussed lumbar medial branch blocks as a diagnostic injection to further identify pain generators.  3.  Right upper buttock pain patient may have right sacroiliac dysfunction as well, if lumbar medial branch blocks are not helpful for his current complaints, would proceed with right sacroiliac injection. Discussed with patient agrees with plan

## 2020-02-29 ENCOUNTER — Other Ambulatory Visit: Payer: Self-pay

## 2020-02-29 ENCOUNTER — Encounter: Payer: Self-pay | Admitting: Physical Medicine & Rehabilitation

## 2020-02-29 ENCOUNTER — Encounter: Payer: No Typology Code available for payment source | Admitting: Physical Medicine & Rehabilitation

## 2020-02-29 NOTE — Progress Notes (Unsigned)
  PROCEDURE RECORD [facility]   Name: Grant Taylor DOB:[birth year] MRN: [id number]  Date:02/29/2020  Physician: Alysia Penna, MD    Nurse/CMA: Jorja Loa MA  Allergies:  Allergies  Allergen Reactions  . Levofloxacin Rash    Other reaction(s): Angioedema  . Lisinopril Cough    Other reaction(s): Angioedema  . Diclofenac     Other reaction(s): Lip swelling  . Dust Mite Extract Nausea And Vomiting  . Elemental Sulfur   . Etodolac Nausea And Vomiting    Other reaction(s): Facial swelling  . No Known Allergies     Other reaction(s): ANGIOEDEMA OF LIPS, Angioedema of tongue, ANGIOEDEMA OF LIPS, Angioedema of tongue  . Tolterodine   . Citalopram Other (See Comments)    Other reaction(s): Drowsy  . Other Rash    Consent Signed: {yes [id number]}  Is patient diabetic? No.  CBG today? N/A  Pregnant: No. LMP: No LMP for male patient. (age 28-55)  Anticoagulants: no Anti-inflammatory: no Antibiotics: no  Procedure: r Bilateral L3-4-5 MBB Position: Prone Start Time: End Time:  Fluoro Time:   RN/CMA Bevan Disney MA [place] Gustafson MA    Time 3:17PM     BP 149/91     Pulse 97     Respirations 14     O2 Sat 95     S/S 6 6    Pain Level       D/C home with ***, patient A & O X 3, D/C instructions reviewed, and sits independently.

## 2020-03-29 ENCOUNTER — Encounter: Payer: No Typology Code available for payment source | Admitting: Physical Medicine & Rehabilitation

## 2020-05-23 ENCOUNTER — Other Ambulatory Visit: Payer: Self-pay

## 2020-05-23 ENCOUNTER — Encounter: Payer: Self-pay | Admitting: Physical Medicine & Rehabilitation

## 2020-05-23 ENCOUNTER — Encounter
Payer: No Typology Code available for payment source | Attending: Physical Medicine & Rehabilitation | Admitting: Physical Medicine & Rehabilitation

## 2020-05-23 VITALS — BP 122/75 | HR 100 | Temp 98.6°F | Ht 67.0 in | Wt 141.0 lb

## 2020-05-23 DIAGNOSIS — M47816 Spondylosis without myelopathy or radiculopathy, lumbar region: Secondary | ICD-10-CM | POA: Diagnosis present

## 2020-05-23 NOTE — Progress Notes (Signed)
  PROCEDURE RECORD [facility]   Name: Grant Taylor DOB:02-05-1945 MRN: [id number]  Date:05/23/2020  Physician: Alysia Penna, MD    Nurse/CMA: Truman Hayward, CMA   Allergies:  Allergies  Allergen Reactions  . Levofloxacin Rash    Other reaction(s): Angioedema  . Lisinopril Cough    Other reaction(s): Angioedema  . Diclofenac     Other reaction(s): Lip swelling  . Dust Mite Extract Nausea And Vomiting  . Elemental Sulfur   . Etodolac Nausea And Vomiting    Other reaction(s): Facial swelling  . No Known Allergies     Other reaction(s): ANGIOEDEMA OF LIPS, Angioedema of tongue, ANGIOEDEMA OF LIPS, Angioedema of tongue  . Tolterodine   . Citalopram Other (See Comments)    Other reaction(s): Drowsy  . Other Rash    Consent Signed: No.  Is patient diabetic? No.  CBG today?   Pregnant: No. LMP: No LMP for male patient. (age 63-55)  Anticoagulants: no Anti-inflammatory: no Antibiotics: no  Procedure: bilateral lumbar 3,4,5 medial branch block  Position: Prone Start Time: 2:47pm  End Time: 2:58pm  Fluoro Time: 35  RN/CMA Garnett Nunziata, CMA Lee, CMA    Time 2:24pm 3:04pm    BP 122/76 152/89    Pulse 101 98    Respirations 16 16    O2 Sat 97 98    S/S 6 6    Pain Level 8/10 4/10     D/C home with self, patient A & O X 3, D/C instructions reviewed, and sits independently.

## 2020-05-23 NOTE — Progress Notes (Signed)
Bilateral Lumbar L3, L4  medial branch blocks and L 5 dorsal ramus injection under fluoroscopic guidance  Indication: Lumbar pain which is not relieved by medication management or other conservative care and interfering with self-care and mobility.  Informed consent was obtained after describing risks and benefits of the procedure with the patient, this includes bleeding, infection, paralysis and medication side effects.  The patient wishes to proceed and has given written consent.  The patient was placed in prone position.  The lumbar area was marked and prepped with Betadine.  One mL of 1% lidocaine was injected into each of 6 areas into the skin and subcutaneous tissue.  Then a 22-gauge 3.5in spinal needle was inserted targeting the junction of the left S1 superior articular process and sacral ala junction. Needle was advanced under fluoroscopic guidance.  Bone contact was made.  Isovue 200 was injected x 0.5 mL demonstrating no intravascular uptake.  Then a solution  of 2% MPF lidocaine was injected x 0.5 mL.  Then the left L5 superior articular process in transverse process junction was targeted.  Bone contact was made.  Isovue 200 was injected x 0.5 mL demonstrating no intravascular uptake. Then a solution containing  2% MPF lidocaine was injected x 0.5 mL.  Then the left L4 superior articular process in transverse process junction was targeted.  Bone contact was made.  Isovue 200 was injected x 0.5 mL demonstrating no intravascular uptake.  Then a solution containing2% MPF lidocaine was injected x 0.5 mL.  This same procedure was performed on the right side using the same needle, technique and injectate.  Patient tolerated procedure well.  Post procedure instructions were given.

## 2020-05-23 NOTE — Patient Instructions (Signed)

## 2020-06-28 ENCOUNTER — Ambulatory Visit: Payer: No Typology Code available for payment source | Admitting: Physical Medicine & Rehabilitation

## 2020-07-02 ENCOUNTER — Encounter: Payer: Self-pay | Admitting: Physical Medicine & Rehabilitation

## 2020-07-02 ENCOUNTER — Other Ambulatory Visit: Payer: Self-pay

## 2020-07-02 ENCOUNTER — Encounter
Payer: No Typology Code available for payment source | Attending: Physical Medicine & Rehabilitation | Admitting: Physical Medicine & Rehabilitation

## 2020-07-02 VITALS — BP 121/78 | HR 99 | Temp 98.6°F | Ht 67.0 in | Wt 141.0 lb

## 2020-07-02 DIAGNOSIS — M47816 Spondylosis without myelopathy or radiculopathy, lumbar region: Secondary | ICD-10-CM | POA: Insufficient documentation

## 2020-07-02 NOTE — Patient Instructions (Signed)

## 2020-07-02 NOTE — Progress Notes (Signed)
Bilateral Lumbar L3, L4  medial branch blocks and L 5 dorsal ramus injection under fluoroscopic guidance  Indication: Lumbar pain which is not relieved by medication management or other conservative care and interfering with self-care and mobility.  Informed consent was obtained after describing risks and benefits of the procedure with the patient, this includes bleeding, infection, paralysis and medication side effects.  The patient wishes to proceed and has given written consent.  The patient was placed in prone position.  The lumbar area was marked and prepped with Betadine.  One mL of 1% lidocaine was injected into each of 6 areas into the skin and subcutaneous tissue.  Then a 22-gauge 3.5in spinal needle was inserted targeting the junction of the left S1 superior articular process and sacral ala junction. Needle was advanced under fluoroscopic guidance.  Bone contact was made.  Isovue 200 was injected x 0.5 mL demonstrating no intravascular uptake.  Then a solution  of 2% MPF lidocaine was injected x 0.5 mL.  Then the left L5 superior articular process in transverse process junction was targeted.  Bone contact was made.  Isovue 200 was injected x 0.5 mL demonstrating no intravascular uptake. Then a solution containing  2% MPF lidocaine was injected x 0.5 mL.  Then the left L4 superior articular process in transverse process junction was targeted.  Bone contact was made.  Isovue 200 was injected x 0.5 mL demonstrating no intravascular uptake.  Then a solution containing2% MPF lidocaine was injected x 0.5 mL.  This same procedure was performed on the right side using the same needle, technique and injectate.  Patient tolerated procedure well.  Post procedure instructions were given.  Pre injection 8/10 Post injection 4/10

## 2020-07-02 NOTE — Progress Notes (Signed)
  PROCEDURE RECORD [facility]   Name: [name] DOB:09/17/1945 MRN: [id number]  Date:07/02/2020  Physician: Alysia Penna, MD    Nurse/CMA: Keoni Risinger, CMA  Allergies:  Allergies  Allergen Reactions  . Levofloxacin Rash    Other reaction(s): Angioedema  . Lisinopril Cough    Other reaction(s): Angioedema  . Diclofenac     Other reaction(s): Lip swelling  . Dust Mite Extract Nausea And Vomiting  . Elemental Sulfur   . Etodolac Nausea And Vomiting    Other reaction(s): Facial swelling  . No Known Allergies     Other reaction(s): ANGIOEDEMA OF LIPS, Angioedema of tongue, ANGIOEDEMA OF LIPS, Angioedema of tongue  . Tolterodine   . Citalopram Other (See Comments)    Other reaction(s): Drowsy  . Other Rash    Consent Signed: Yes.    Is patient diabetic? No.  CBG today?  Pregnant: No. LMP: No LMP for male patient. (age 66-55)  Anticoagulants: no Anti-inflammatory: no Antibiotics: no  Procedure: bilateral L3,4,5  Medial branch block  Position: Prone Start Time: 1:00 pm     End Time: 1:15pm  Fluoro Time: 45s  RN/CMA Lavere Shinsky, CMA Jonathan Corpus, CMA    Time 12:45pm 1:20pm    BP 121/78 132/77    Pulse 100 102    Respirations 16 16    O2 Sat 97 97    S/S 6 6    Pain Level 8/10 4/10     D/C home with self, patient A & O X 3, D/C instructions reviewed, and sits independently.      Patient ID: Grant Taylor, male   DOB: 12-Nov-1945, 75 y.o.   MRN: [id number]

## 2020-07-04 ENCOUNTER — Telehealth: Payer: Self-pay

## 2020-07-04 NOTE — Telephone Encounter (Signed)
Grant Taylor had a MMB on 07/02/2020. Today he is in pain. Patient stated he cannot sleep, walk, or stand. He has requested something for he nerve pain. "The pain is so bad its debilitating."   If Rx is granted please send to [facility] on [address] in [place]. Thank you.

## 2020-07-05 MED ORDER — METHOCARBAMOL 500 MG PO TABS: 500.0000 mg | ORAL_TABLET | Freq: Four times a day (QID) | ORAL | 0 refills | Status: AC | PRN

## 2020-07-05 NOTE — Telephone Encounter (Signed)
Per Dr Letta Pate:   "May send prescription for methocarbamol 500 mg every 6 hours as needed #45 no refills".  Medication sent in and Grant Taylor notified. He says he feels it is more nerve pain than muscular and asks if something else can be prescribed. Please advise.

## 2020-07-08 ENCOUNTER — Telehealth: Payer: Self-pay

## 2020-07-08 MED ORDER — GABAPENTIN 100 MG PO CAPS: 100.0000 mg | ORAL_CAPSULE | Freq: Three times a day (TID) | ORAL | 0 refills | Status: DC

## 2020-07-08 NOTE — Telephone Encounter (Signed)
Per Dr Letta Pate, "May call in gabapentin 100 mg 3 times daily #90 no refills".  Sent to pharmacy and Mr Grant Taylor notified.  He reports that he has taken 300 mg before so he is wondering if it can be increased.  He has not been able to sleep more than 2 hours at a time because of the nerve pain.

## 2020-07-08 NOTE — Telephone Encounter (Signed)
Patient called back asking if he should take the medication. He is complaining that he has a collapse lung and COPD and use an inhaler. Maybe he can have something else prescribed.

## 2020-07-08 NOTE — Telephone Encounter (Signed)
Patient wants to know if it is safe to take Gabapentin with him having COPD?

## 2020-07-09 NOTE — Telephone Encounter (Signed)
Patient has been notified

## 2020-08-09 ENCOUNTER — Other Ambulatory Visit: Payer: Self-pay

## 2020-08-09 ENCOUNTER — Emergency Department (HOSPITAL_COMMUNITY): Payer: No Typology Code available for payment source

## 2020-08-09 ENCOUNTER — Inpatient Hospital Stay (HOSPITAL_COMMUNITY)
Admission: EM | Admit: 2020-08-09 | Discharge: 2020-08-22 | DRG: 853 | Disposition: A | Discharge status: Home Health | Payer: No Typology Code available for payment source | Attending: Internal Medicine | Admitting: Internal Medicine

## 2020-08-09 ENCOUNTER — Observation Stay (HOSPITAL_COMMUNITY): Payer: No Typology Code available for payment source

## 2020-08-09 ENCOUNTER — Encounter (HOSPITAL_COMMUNITY): Payer: Self-pay

## 2020-08-09 DIAGNOSIS — C7801 Secondary malignant neoplasm of right lung: Secondary | ICD-10-CM | POA: Diagnosis present

## 2020-08-09 DIAGNOSIS — R0609 Other forms of dyspnea: Secondary | ICD-10-CM | POA: Diagnosis not present

## 2020-08-09 DIAGNOSIS — Z20822 Contact with and (suspected) exposure to covid-19: Secondary | ICD-10-CM | POA: Diagnosis present

## 2020-08-09 DIAGNOSIS — A419 Sepsis, unspecified organism: Secondary | ICD-10-CM

## 2020-08-09 DIAGNOSIS — R918 Other nonspecific abnormal finding of lung field: Secondary | ICD-10-CM

## 2020-08-09 DIAGNOSIS — J449 Chronic obstructive pulmonary disease, unspecified: Secondary | ICD-10-CM | POA: Diagnosis present

## 2020-08-09 DIAGNOSIS — F32A Depression, unspecified: Secondary | ICD-10-CM | POA: Diagnosis present

## 2020-08-09 DIAGNOSIS — N179 Acute kidney failure, unspecified: Secondary | ICD-10-CM | POA: Diagnosis not present

## 2020-08-09 DIAGNOSIS — R0602 Shortness of breath: Secondary | ICD-10-CM | POA: Diagnosis not present

## 2020-08-09 DIAGNOSIS — G4733 Obstructive sleep apnea (adult) (pediatric): Secondary | ICD-10-CM | POA: Diagnosis present

## 2020-08-09 DIAGNOSIS — J9601 Acute respiratory failure with hypoxia: Secondary | ICD-10-CM

## 2020-08-09 DIAGNOSIS — C7802 Secondary malignant neoplasm of left lung: Secondary | ICD-10-CM | POA: Diagnosis present

## 2020-08-09 DIAGNOSIS — C787 Secondary malignant neoplasm of liver and intrahepatic bile duct: Secondary | ICD-10-CM | POA: Diagnosis present

## 2020-08-09 DIAGNOSIS — K219 Gastro-esophageal reflux disease without esophagitis: Secondary | ICD-10-CM | POA: Diagnosis present

## 2020-08-09 DIAGNOSIS — Z888 Allergy status to other drugs, medicaments and biological substances status: Secondary | ICD-10-CM

## 2020-08-09 DIAGNOSIS — E538 Deficiency of other specified B group vitamins: Secondary | ICD-10-CM | POA: Diagnosis present

## 2020-08-09 DIAGNOSIS — J440 Chronic obstructive pulmonary disease with acute lower respiratory infection: Secondary | ICD-10-CM | POA: Diagnosis present

## 2020-08-09 DIAGNOSIS — Z881 Allergy status to other antibiotic agents status: Secondary | ICD-10-CM

## 2020-08-09 DIAGNOSIS — D638 Anemia in other chronic diseases classified elsewhere: Secondary | ICD-10-CM | POA: Diagnosis present

## 2020-08-09 DIAGNOSIS — J189 Pneumonia, unspecified organism: Secondary | ICD-10-CM | POA: Diagnosis present

## 2020-08-09 DIAGNOSIS — D500 Iron deficiency anemia secondary to blood loss (chronic): Secondary | ICD-10-CM

## 2020-08-09 DIAGNOSIS — R6520 Severe sepsis without septic shock: Secondary | ICD-10-CM | POA: Diagnosis present

## 2020-08-09 DIAGNOSIS — C770 Secondary and unspecified malignant neoplasm of lymph nodes of head, face and neck: Secondary | ICD-10-CM | POA: Diagnosis present

## 2020-08-09 DIAGNOSIS — Z79899 Other long term (current) drug therapy: Secondary | ICD-10-CM

## 2020-08-09 DIAGNOSIS — R739 Hyperglycemia, unspecified: Secondary | ICD-10-CM | POA: Diagnosis present

## 2020-08-09 DIAGNOSIS — Z87891 Personal history of nicotine dependence: Secondary | ICD-10-CM

## 2020-08-09 DIAGNOSIS — K8689 Other specified diseases of pancreas: Secondary | ICD-10-CM | POA: Diagnosis present

## 2020-08-09 DIAGNOSIS — I10 Essential (primary) hypertension: Secondary | ICD-10-CM | POA: Diagnosis present

## 2020-08-09 DIAGNOSIS — J9621 Acute and chronic respiratory failure with hypoxia: Secondary | ICD-10-CM | POA: Diagnosis present

## 2020-08-09 DIAGNOSIS — E785 Hyperlipidemia, unspecified: Secondary | ICD-10-CM | POA: Diagnosis present

## 2020-08-09 DIAGNOSIS — R0902 Hypoxemia: Secondary | ICD-10-CM

## 2020-08-09 DIAGNOSIS — C799 Secondary malignant neoplasm of unspecified site: Secondary | ICD-10-CM

## 2020-08-09 DIAGNOSIS — R591 Generalized enlarged lymph nodes: Secondary | ICD-10-CM

## 2020-08-09 DIAGNOSIS — D509 Iron deficiency anemia, unspecified: Secondary | ICD-10-CM | POA: Diagnosis present

## 2020-08-09 DIAGNOSIS — R079 Chest pain, unspecified: Secondary | ICD-10-CM

## 2020-08-09 DIAGNOSIS — C252 Malignant neoplasm of tail of pancreas: Secondary | ICD-10-CM | POA: Diagnosis present

## 2020-08-09 DIAGNOSIS — R0603 Acute respiratory distress: Secondary | ICD-10-CM

## 2020-08-09 DIAGNOSIS — J441 Chronic obstructive pulmonary disease with (acute) exacerbation: Secondary | ICD-10-CM | POA: Diagnosis present

## 2020-08-09 HISTORY — DX: Malignant (primary) neoplasm, unspecified: C80.1

## 2020-08-09 LAB — COMPREHENSIVE METABOLIC PANEL
ALT: 65 U/L — ABNORMAL HIGH (ref 0–44)
AST: 99 U/L — ABNORMAL HIGH (ref 15–41)
Albumin: 2.6 g/dL — ABNORMAL LOW (ref 3.5–5.0)
Alkaline Phosphatase: 82 U/L (ref 38–126)
Anion gap: 13 (ref 5–15)
BUN: 10 mg/dL (ref 8–23)
CO2: 21 mmol/L — ABNORMAL LOW (ref 22–32)
Calcium: 8.5 mg/dL — ABNORMAL LOW (ref 8.9–10.3)
Chloride: 98 mmol/L (ref 98–111)
Creatinine, Ser: 1.09 mg/dL (ref 0.61–1.24)
GFR, Estimated: >60 mL/min (ref >60)
Glucose, Bld: 164 mg/dL — ABNORMAL HIGH (ref 70–99)
Potassium: 4.1 mmol/L (ref 3.5–5.1)
Sodium: 132 mmol/L — ABNORMAL LOW (ref 135–145)
Total Bilirubin: 1.3 mg/dL — ABNORMAL HIGH (ref 0.3–1.2)
Total Protein: 6.9 g/dL (ref 6.5–8.1)

## 2020-08-09 LAB — CBC WITH DIFFERENTIAL/PLATELET
Abs Immature Granulocytes: 0.14 10*3/uL — ABNORMAL HIGH (ref 0.00–0.07)
Basophils Absolute: 0 10*3/uL (ref 0.0–0.1)
Basophils Relative: 0 %
Eosinophils Absolute: 0 10*3/uL (ref 0.0–0.5)
Eosinophils Relative: 0 %
HCT: 34.5 % — ABNORMAL LOW (ref 39.0–52.0)
Hemoglobin: 11.1 g/dL — ABNORMAL LOW (ref 13.0–17.0)
Immature Granulocytes: 1 %
Lymphocytes Relative: 13 %
Lymphs Abs: 2.7 10*3/uL (ref 0.7–4.0)
MCH: 26.4 pg (ref 26.0–34.0)
MCHC: 32.2 g/dL (ref 30.0–36.0)
MCV: 81.9 fL (ref 80.0–100.0)
Monocytes Absolute: 1.8 10*3/uL — ABNORMAL HIGH (ref 0.1–1.0)
Monocytes Relative: 9 %
Neutro Abs: 16.5 10*3/uL — ABNORMAL HIGH (ref 1.7–7.7)
Neutrophils Relative %: 77 %
Platelets: 355 10*3/uL (ref 150–400)
RBC: 4.21 MIL/uL — ABNORMAL LOW (ref 4.22–5.81)
RDW: 16.5 % — ABNORMAL HIGH (ref 11.5–15.5)
WBC: 21.2 10*3/uL — ABNORMAL HIGH (ref 4.0–10.5)
nRBC: 0 % (ref 0.0–0.2)

## 2020-08-09 LAB — TSH: TSH: 1.388 u[IU]/mL (ref 0.350–4.500)

## 2020-08-09 LAB — BLOOD GAS, ARTERIAL
Acid-base deficit: 0.4 mmol/L (ref 0.0–2.0)
Bicarbonate: 22.2 mmol/L (ref 20.0–28.0)
FIO2: 36.01
O2 Saturation: 92.7 %
Patient temperature: 98.6
pCO2 arterial: 32.9 mmHg (ref 32.0–48.0)
pH, Arterial: 7.445 (ref 7.350–7.450)
pO2, Arterial: 65.7 mmHg — ABNORMAL LOW (ref 83.0–108.0)

## 2020-08-09 LAB — ECHOCARDIOGRAM COMPLETE
Height: 67 in
S' Lateral: 2.18 cm
Weight: 2250.46 oz

## 2020-08-09 LAB — TROPONIN I (HIGH SENSITIVITY)
Troponin I (High Sensitivity): 10 ng/L (ref <18)
Troponin I (High Sensitivity): 10 ng/L (ref <18)

## 2020-08-09 LAB — RESP PANEL BY RT-PCR (FLU A&B, COVID) ARPGX2
Influenza A by PCR: NEGATIVE
Influenza B by PCR: NEGATIVE
SARS Coronavirus 2 by RT PCR: NEGATIVE

## 2020-08-09 LAB — D-DIMER, QUANTITATIVE: D-Dimer, Quant: 10.33 ug/mL-FEU — ABNORMAL HIGH (ref 0.00–0.50)

## 2020-08-09 LAB — BRAIN NATRIURETIC PEPTIDE: B Natriuretic Peptide: 118.2 pg/mL — ABNORMAL HIGH (ref 0.0–100.0)

## 2020-08-09 MED ORDER — ACETAMINOPHEN 650 MG RE SUPP: 650.0000 mg | Freq: Four times a day (QID) | RECTAL | Status: DC | PRN

## 2020-08-09 MED ORDER — ONDANSETRON HCL 4 MG/2ML IJ SOLN
4.0000 mg | Freq: Once | INTRAMUSCULAR | Status: AC
  Administered 2020-08-09: 4 mg via INTRAVENOUS
  Filled 2020-08-09: qty 2

## 2020-08-09 MED ORDER — ENSURE ENLIVE PO LIQD
237.0000 mL | Freq: Every day | ORAL | Status: DC
  Administered 2020-08-09 – 2020-08-22 (×13): 237 mL via ORAL

## 2020-08-09 MED ORDER — FAMOTIDINE 20 MG PO TABS
20.0000 mg | ORAL_TABLET | Freq: Every day | ORAL | Status: DC
  Administered 2020-08-09 – 2020-08-22 (×14): 20 mg via ORAL
  Filled 2020-08-09 (×14): qty 1

## 2020-08-09 MED ORDER — PRAVASTATIN SODIUM 20 MG PO TABS
80.0000 mg | ORAL_TABLET | Freq: Every day | ORAL | Status: DC
  Administered 2020-08-09 – 2020-08-22 (×14): 80 mg via ORAL
  Filled 2020-08-09 (×14): qty 4

## 2020-08-09 MED ORDER — GABAPENTIN 300 MG PO CAPS
300.0000 mg | ORAL_CAPSULE | Freq: Three times a day (TID) | ORAL | Status: DC
  Administered 2020-08-09 – 2020-08-22 (×41): 300 mg via ORAL
  Filled 2020-08-09 (×41): qty 1

## 2020-08-09 MED ORDER — DOCUSATE SODIUM 100 MG PO CAPS
100.0000 mg | ORAL_CAPSULE | Freq: Every day | ORAL | Status: DC | PRN
  Administered 2020-08-12 – 2020-08-17 (×2): 100 mg via ORAL
  Filled 2020-08-09 (×2): qty 1

## 2020-08-09 MED ORDER — LEVALBUTEROL HCL 0.63 MG/3ML IN NEBU
0.6300 mg | INHALATION_SOLUTION | Freq: Four times a day (QID) | RESPIRATORY_TRACT | Status: DC | PRN
  Administered 2020-08-10 – 2020-08-12 (×5): 0.63 mg via RESPIRATORY_TRACT
  Filled 2020-08-09 (×6): qty 3

## 2020-08-09 MED ORDER — SODIUM CHLORIDE 0.9 % IV BOLUS
500.0000 mL | Freq: Once | INTRAVENOUS | Status: AC
  Administered 2020-08-09: 500 mL via INTRAVENOUS

## 2020-08-09 MED ORDER — PANTOPRAZOLE SODIUM 40 MG PO TBEC
40.0000 mg | DELAYED_RELEASE_TABLET | Freq: Every day | ORAL | Status: DC
  Administered 2020-08-09 – 2020-08-22 (×14): 40 mg via ORAL
  Filled 2020-08-09 (×14): qty 1

## 2020-08-09 MED ORDER — TIZANIDINE HCL 4 MG PO TABS
4.0000 mg | ORAL_TABLET | Freq: Three times a day (TID) | ORAL | Status: DC | PRN
  Administered 2020-08-10 – 2020-08-18 (×5): 4 mg via ORAL
  Filled 2020-08-09 (×5): qty 1

## 2020-08-09 MED ORDER — POLYETHYLENE GLYCOL 3350 17 G PO PACK
17.0000 g | PACK | Freq: Every day | ORAL | Status: DC
  Administered 2020-08-09 – 2020-08-22 (×13): 17 g via ORAL
  Filled 2020-08-09 (×13): qty 1

## 2020-08-09 MED ORDER — ACETAMINOPHEN 325 MG PO TABS
650.0000 mg | ORAL_TABLET | Freq: Four times a day (QID) | ORAL | Status: DC | PRN
  Administered 2020-08-11 – 2020-08-12 (×2): 650 mg via ORAL
  Filled 2020-08-09 (×2): qty 2

## 2020-08-09 MED ORDER — FUROSEMIDE 10 MG/ML IJ SOLN: 20.0000 mg | Freq: Once | INTRAMUSCULAR | Status: DC

## 2020-08-09 MED ORDER — HYDROMORPHONE HCL 1 MG/ML IJ SOLN
1.0000 mg | Freq: Once | INTRAMUSCULAR | Status: AC
  Administered 2020-08-09: 1 mg via INTRAVENOUS
  Filled 2020-08-09: qty 1

## 2020-08-09 MED ORDER — SODIUM CHLORIDE 0.9 % IV SOLN
500.0000 mg | Freq: Every day | INTRAVENOUS | Status: DC
  Administered 2020-08-09 – 2020-08-11 (×3): 500 mg via INTRAVENOUS
  Filled 2020-08-09 (×3): qty 500

## 2020-08-09 MED ORDER — SODIUM CHLORIDE 0.9 % IV SOLN
2.0000 g | INTRAVENOUS | Status: AC
  Administered 2020-08-09 – 2020-08-13 (×5): 2 g via INTRAVENOUS
  Filled 2020-08-09 (×2): qty 2
  Filled 2020-08-09: qty 20
  Filled 2020-08-09: qty 2
  Filled 2020-08-09: qty 20

## 2020-08-09 MED ORDER — LOSARTAN POTASSIUM 25 MG PO TABS
25.0000 mg | ORAL_TABLET | Freq: Every day | ORAL | Status: DC
  Administered 2020-08-09 – 2020-08-22 (×14): 25 mg via ORAL
  Filled 2020-08-09 (×14): qty 1

## 2020-08-09 MED ORDER — IPRATROPIUM-ALBUTEROL 0.5-2.5 (3) MG/3ML IN SOLN
3.0000 mL | Freq: Once | RESPIRATORY_TRACT | Status: AC
  Administered 2020-08-09: 3 mL via RESPIRATORY_TRACT
  Filled 2020-08-09: qty 3

## 2020-08-09 MED ORDER — OXYCODONE HCL 5 MG PO TABS
5.0000 mg | ORAL_TABLET | Freq: Four times a day (QID) | ORAL | Status: DC | PRN
  Administered 2020-08-09 – 2020-08-10 (×2): 5 mg via ORAL
  Filled 2020-08-09 (×3): qty 1

## 2020-08-09 MED ORDER — HYDROXYZINE HCL 10 MG PO TABS: 10.0000 mg | ORAL_TABLET | Freq: Every day | ORAL | Status: DC | PRN

## 2020-08-09 MED ORDER — IOHEXOL 350 MG/ML SOLN
100.0000 mL | Freq: Once | INTRAVENOUS | Status: AC | PRN
  Administered 2020-08-09: 100 mL via INTRAVENOUS

## 2020-08-09 MED ORDER — BUPROPION HCL ER (XL) 300 MG PO TB24
300.0000 mg | ORAL_TABLET | Freq: Every day | ORAL | Status: DC
  Administered 2020-08-09 – 2020-08-22 (×14): 300 mg via ORAL
  Filled 2020-08-09 (×4): qty 1
  Filled 2020-08-09: qty 2
  Filled 2020-08-09 (×9): qty 1

## 2020-08-09 MED ORDER — MORPHINE SULFATE (PF) 4 MG/ML IV SOLN
4.0000 mg | INTRAVENOUS | Status: AC | PRN
  Administered 2020-08-09 (×2): 4 mg via INTRAVENOUS
  Filled 2020-08-09 (×2): qty 1

## 2020-08-09 MED ORDER — ENOXAPARIN SODIUM 40 MG/0.4ML IJ SOSY
40.0000 mg | PREFILLED_SYRINGE | Freq: Every day | INTRAMUSCULAR | Status: DC
  Administered 2020-08-09 – 2020-08-15 (×7): 40 mg via SUBCUTANEOUS
  Filled 2020-08-09 (×7): qty 0.4

## 2020-08-09 MED ORDER — AMLODIPINE BESYLATE 10 MG PO TABS
10.0000 mg | ORAL_TABLET | Freq: Every day | ORAL | Status: DC
  Administered 2020-08-09 – 2020-08-22 (×14): 10 mg via ORAL
  Filled 2020-08-09: qty 1
  Filled 2020-08-09: qty 2
  Filled 2020-08-09 (×12): qty 1

## 2020-08-09 NOTE — ED Triage Notes (Addendum)
Pt dx with pancreatic tumor that is pressing on lung. Smaller tumors also in lungs. Pt began "sucking air" about 5-7 hour ago. EMS gave duoneb and 500 ml bolus en route. No relief of s/s

## 2020-08-09 NOTE — ED Notes (Signed)
Pt states he has frequent urination. Urinal at bedside

## 2020-08-09 NOTE — ED Provider Notes (Signed)
[facility] DEPT Provider Note   CSN: [id number] Arrival date & time: 08/09/20  0606     History Chief Complaint  Patient presents with   Shortness of Breath   Flank Pain    Grant Taylor is a 75 y.o. male.  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  Patient states around 10:00 last night he had a coughing fit and after this he developed sudden onset of shortness of breath with a left-sided chest discomfort which has been constant since that time.  Patient was waiting for his symptoms to resolve spontaneously and they were not which prompted him to call EMS this morning to come to the emergency room.  Prior to this episode, felt like he was in his usual state of health with occasional cough.  Patient states that he has been short of breath at times, history of COPD, had been to his pulmonologist (care through the [place]) who told him he had a small collapsed lung on the left but no treatment has been done.  Patient had been suggested to be on home O2 which he is not.  He is vague on these details.      Past Medical History:  Diagnosis Date   Cancer ([place])    Chronic GERD    Constipation    ED (erectile dysfunction)    History of BPH    Hyperlipemia    Hypertension    Hypokalemia    OAB (overactive bladder)    Obstructive sleep apnea    Positive PPD    Vitamin B 12 deficiency     Patient Active Problem List   Diagnosis Date Noted   Acute respiratory failure with hypoxia ([place]) 08/09/2020     Past Surgical History:  Procedure Laterality Date   LAMINECTOMY AND MICRODISCECTOMY LUMBAR SPINE  2007       No  family history on file.  Social History   Tobacco Use   Smoking status: Former   Smokeless tobacco: Never  Scientific laboratory technician Use: Former  Substance Use Topics   Alcohol use: Not Currently   Drug use: Not Currently    Home Medications Prior to Admission medications   Medication Sig Start Date End Date Taking? Authorizing Provider  albuterol (VENTOLIN HFA) 108 (90 Base) MCG/ACT inhaler Inhale into the lungs every 6 (six) hours as needed for wheezing or shortness of breath.    [provider]  amLODipine (NORVASC) 10 MG tablet TAKE ONE TABLET BY MOUTH DAILY FOR HEART 10/31/19   [provider]  amLODipine-benazepril (LOTREL) 10-20 MG capsule Take 1 capsule by mouth daily.    [provider]  buPROPion (WELLBUTRIN XL) 300 MG 24 hr tablet Take by mouth daily.    [provider]  Carboxymethylcellulose Sodium 1 % GEL APPLY ONE DROP TO EACH EYE TWICE A DAY AS NEEDED 06/21/19   [provider]  chlorpheniramine (CHLOR-TRIMETON) 4 MG tablet TAKE ONE TABLET BY MOUTH  PRN 05/05/16   [provider]  Cholecalciferol (VITAMIN D3) 10 MCG (400 UNIT) CAPS Take by mouth.    [provider]  Cholecalciferol 50 MCG (2000 UT) TABS TAKE ONE TABLET BY MOUTH  DAILY LOW VITAMIN D 10/31/19   [provider]  diclofenac (VOLTAREN) 75 MG EC tablet Take 75 mg by mouth 2 (two) times daily.    [provider]  diclofenac Sodium (VOLTAREN) 1 % GEL Apply topically 4 (four) times daily.    [provider]  docusate sodium (COLACE) 100 MG capsule Take 100 mg by mouth 2 (two) times daily.    [provider]  famotidine (PEPCID) 20 MG tablet Take by mouth 2 (two) times daily.    [provider]  fluticasone (FLONASE) 50 MCG/ACT nasal spray INSTILL 2 SPRAYS IN EACH NOSTRIL DAILY 10/31/19   [provider]  gabapentin (NEURONTIN) 100 MG capsule Take 1 capsule (100 mg total) by mouth 3 (three) times daily. 07/08/20    Kirsteins, Luanna Salk, MD  lidocaine (LIDODERM) 5 % APPLY 1 PATCH TO SKIN ONCE DAILY (APPLY FOR 12 HOURS, THEN REMOVE FOR 12 HOURS) FOR  LOCALIZED WORST PAIN 10/31/19   [provider]  losartan (COZAAR) 50 MG tablet Take by mouth daily.    [provider]  methocarbamol (ROBAXIN) 500 MG tablet Take 1 tablet (500 mg total) by mouth every 6 (six) hours as needed for muscle spasms. 07/05/20   Kirsteins, Luanna Salk, MD  mirabegron ER (MYRBETRIQ) 25 MG TB24 tablet Take 25 mg by mouth daily.    [provider]  omeprazole (PRILOSEC) 40 MG capsule Take 40 mg by mouth daily.    [provider]  polyethylene glycol (MIRALAX / GLYCOLAX) 17 g packet Take 17 g by mouth daily. 12/27/19   Khatri, Hina, PA-C  polyethylene glycol powder (GLYCOLAX/MIRALAX) 17 GM/SCOOP powder TAKE 17 GRAMS BY MOUTH DAILY (MIX WITH WATER OR OTHER LIQUID AS INSTRUCTED BEFORE DRINKING) FOR CONSTIPATION; TAKE WITH 8 OUNCES OF WATER, HOLD FOR DIARRHEA. 01/05/20   [provider]  potassium chloride (KLOR-CON) 20 MEQ packet Take by mouth 2 (two) times daily.    [provider]  potassium chloride SA (KLOR-CON) 20 MEQ tablet TAKE ONE TABLET BY MOUTH TWICE A DAY AFTER MEALS (TAKE WITH FOOD) LOW POTASSIUM 10/31/19   [provider]  pravastatin (PRAVACHOL) 80 MG tablet Take 80 mg by mouth daily.    [provider]  sildenafil (VIAGRA) 100 MG tablet Take 100 mg by mouth daily as needed for erectile dysfunction.    [provider]  sodium chloride (BRONCHO SALINE) inhaler solution Take 1 spray by nebulization as needed.    [provider]    Allergies    Levofloxacin, Lisinopril, Diclofenac, Dust mite extract, Elemental sulfur, Etodolac, No known allergies, Tolterodine, Citalopram, and Other  Review of Systems   Review of Systems  Constitutional:  Negative for chills, diaphoresis and fever.  Respiratory:  Positive for cough and shortness of breath.    Cardiovascular:  Positive for chest pain.  Gastrointestinal:  Negative for abdominal pain, nausea and vomiting.  Genitourinary:  Negative for difficulty urinating.  Musculoskeletal:  Negative for arthralgias and myalgias.  Skin:  Negative for rash and wound.  Neurological:  Negative for weakness.  Hematological:  Positive for adenopathy.  Psychiatric/Behavioral:  Negative for confusion.   All other systems reviewed and are negative.  Physical Exam Updated Vital Signs BP (!) 155/90 (BP Location: Left Arm)   Pulse (!) 127   Temp 98.3 F (36.8 C) (Axillary)   Resp 18   SpO2 96%   Physical Exam Vitals and nursing note reviewed.  Constitutional:      General: He is in  acute distress.     Appearance: He is well-developed. He is not diaphoretic.  HENT:     Head: Normocephalic and atraumatic.  Cardiovascular:     Rate and Rhythm: Regular rhythm. Tachycardia present.  Pulmonary:     Effort: Tachypnea present.     Breath sounds: Examination of the left-lower field reveals decreased breath sounds. Decreased breath sounds present.  Chest:     Chest wall: No tenderness.  Breasts:    Left: Supraclavicular adenopathy present.  Abdominal:     Palpations: Abdomen is soft.     Tenderness: There is no abdominal tenderness.  Lymphadenopathy:     Cervical:     Left cervical: No superficial cervical adenopathy.     Upper Body:     Left upper body: Supraclavicular adenopathy present.  Skin:    General: Skin is warm and dry.     Findings: No erythema or rash.  Neurological:     Mental Status: He is alert and oriented to person, place, and time.  Psychiatric:        Behavior: Behavior normal.    ED Results / Procedures / Treatments   Labs (all labs ordered are listed, but only abnormal results are displayed) Labs Reviewed  CBC WITH DIFFERENTIAL/PLATELET - Abnormal; Notable for the following components:      Result Value   WBC 21.2 (*)    RBC 4.21 (*)    Hemoglobin 11.1 (*)     HCT 34.5 (*)    RDW 16.5 (*)    Neutro Abs 16.5 (*)    Monocytes Absolute 1.8 (*)    Abs Immature Granulocytes 0.14 (*)    All other components within normal limits  COMPREHENSIVE METABOLIC PANEL - Abnormal; Notable for the following components:   Sodium 132 (*)    CO2 21 (*)    Glucose, Bld 164 (*)    Calcium 8.5 (*)    Albumin 2.6 (*)    AST 99 (*)    ALT 65 (*)    Total Bilirubin 1.3 (*)    All other components within normal limits  BRAIN NATRIURETIC PEPTIDE - Abnormal; Notable for the following components:   B Natriuretic Peptide 118.2 (*)    All other components within normal limits  D-DIMER, QUANTITATIVE (NOT AT [facility]) - Abnormal; Notable for the following components:   D-Dimer, Quant 10.33 (*)    All other components within normal limits  RESP PANEL BY RT-PCR (FLU A&B, COVID) ARPGX2  TROPONIN I (HIGH SENSITIVITY)  TROPONIN I (HIGH SENSITIVITY)    EKG EKG Interpretation  Date/Time:  Friday August 09 2020 06:24:35 EDT Ventricular Rate:  136 PR Interval:  150 QRS Duration: 70 QT Interval:  333 QTC Calculation: 501 R Axis:   58 Text Interpretation: Sinus tachycardia Ventricular premature complex Aberrant conduction of SV complex(es) Prolonged QT interval No previous ECGs available Confirmed by Ezequiel Essex [phone]) on 08/09/2020 6:36:15 AM  Radiology CT Angio Chest PE W/Cm &/Or Wo Cm  Result Date: 08/09/2020 CLINICAL DATA:  Shortness of breath.  Pancreatic mass. EXAM: CT ANGIOGRAPHY CHEST WITH CONTRAST TECHNIQUE: Multidetector CT imaging of the chest was performed using the standard protocol during bolus administration of intravenous contrast. Multiplanar CT image reconstructions and MIPs were obtained to evaluate the vascular anatomy. CONTRAST:  127mL OMNIPAQUE IOHEXOL 350 MG/ML SOLN COMPARISON:  None. FINDINGS: Cardiovascular: No filling defects in the pulmonary arteries to suggest pulmonary emboli. Heart is normal size. Aorta is normal caliber. Scattered aortic  calcifications. Mediastinum/Nodes: Mediastinal and  bilateral hilar adenopathy. No axillary adenopathy. Index prevascular lymph node has a short axis diameter of 2.7 cm. Lungs/Pleura: Extensive innumerable bilateral pulmonary nodules and masses. Index right lower lobe mass on image 111 measures 2.6 cm. Index left lower lobe mass on image 86 measures 2.5 cm. Small left pleural effusion. Upper Abdomen: Probable peritoneal implants in the left upper lobe anterior to the spleen. See abdominal CT report for further discussion. Musculoskeletal: Chest wall soft tissues are unremarkable. No acute bony abnormality. Review of the MIP images confirms the above findings. IMPRESSION: No evidence of pulmonary embolus. Innumerable bilateral pulmonary nodules and masses compatible metastases. Bulky mediastinal and bilateral hilar adenopathy. Small left pleural effusion. Electronically Signed   By: Rolm Baptise M.D.   On: 08/09/2020 08:55   CT Abdomen Pelvis W Contrast  Result Date: 08/09/2020 CLINICAL DATA:  Metastatic disease evaluation.  Pancreatic tumor. EXAM: CT ABDOMEN AND PELVIS WITH CONTRAST TECHNIQUE: Multidetector CT imaging of the abdomen and pelvis was performed using the standard protocol following bolus administration of intravenous contrast. CONTRAST:  133mL OMNIPAQUE IOHEXOL 350 MG/ML SOLN COMPARISON:  Chest CT today.  CT abdomen and pelvis 12/27/2019 FINDINGS: Lower chest: Innumerable masses in the lung bases. Small left pleural effusion. Hepatobiliary: Scattered low-density lesions throughout the liver which do not appear to be cystic and are concerning for metastases. Gallbladder unremarkable. Pancreas: Large mass in the pancreatic tail measures up to approximately 10 cm with extension anterior to the spleen and possible involvement of the anterior upper pole of the left kidney. Spleen: No focal abnormality.  Normal size. Adrenals/Urinary Tract: Tumor from the pancreatic tail appears to extend to and possibly  involve the anterior to mid pole of the left kidney. No hydronephrosis. Right adrenal gland unremarkable. Left adrenal gland not visualized and likely encased by the pancreatic tumor. Urinary bladder unremarkable. Stomach/Bowel: Pancreatic tumor abuts the proximal descending thoracic aorta at the splenic flexure. No evidence of bowel obstruction. Vascular/Lymphatic: Aortic atherosclerosis. Large left periaortic lymph node with a short axis diameter of 3.3 cm. Other enlarged upper abdominal retroperitoneal lymph nodes. Reproductive: No visible focal abnormality. Other: Small amount of free fluid in the pelvis. Musculoskeletal: No acute bony abnormality. IMPRESSION: Large mass involving the pancreatic tail measuring up to 10 cm with extension in the left retroperitoneum with possible involvement of the left kidney. Mass extends anterior to the spleen. Retroperitoneal adenopathy. Low-density lesions in the liver, likely metastases. Innumerable masses in the pulmonary bases compatible with metastases. Small left pleural effusion. Small amount of free fluid in the pelvis. Aortic atherosclerosis. Electronically Signed   By: Rolm Baptise M.D.   On: 08/09/2020 08:59   DG Chest Portable 1 View  Result Date: 08/09/2020 CLINICAL DATA:  75 year old male with shortness of breath. History of pancreatic tumor. EXAM: PORTABLE CHEST 1 VIEW COMPARISON:  No priors. FINDINGS: Widespread areas of interstitial prominence are noted throughout the lungs bilaterally, most evident in the mid to lower lungs, strongly suggestive of interstitial lung disease. Lung volumes are normal. In addition, there are extensive areas of nodularity throughout the lungs bilaterally also most evident throughout the mid to lower lungs, concerning for widespread metastatic disease. Small left pleural effusion. No definite right pleural effusion. No pneumothorax. No evidence of pulmonary edema. Heart size is normal. Upper mediastinal contours are within  normal limits. Orthopedic fixation hardware in the lower cervical spine incompletely imaged. IMPRESSION: 1. The appearance the chest suggest widespread metastatic disease to the lungs, superimposed upon a background of chronic interstitial lung  disease, as above. 2. Small left pleural effusion. Electronically Signed   By: Vinnie Langton M.D.   On: 08/09/2020 07:16    Procedures Procedures   Medications Ordered in ED Medications  ipratropium-albuterol (DUONEB) 0.5-2.5 (3) MG/3ML nebulizer solution 3 mL (has no administration in time range)  ondansetron (ZOFRAN) injection 4 mg (4 mg Intravenous Given 08/09/20 0732)  morphine 4 MG/ML injection 4 mg (4 mg Intravenous Given 08/09/20 0903)  sodium chloride 0.9 % bolus 500 mL (500 mLs Intravenous New Bag/Given 08/09/20 0901)  iohexol (OMNIPAQUE) 350 MG/ML injection 100 mL (100 mLs Intravenous Contrast Given 08/09/20 3557)    ED Course  I have reviewed the triage vital signs and the nursing notes.  Pertinent labs & imaging results that were available during my care of the patient were reviewed by me and considered in my medical decision making (see chart for details).  Clinical Course as of 08/09/20 3220  Fri Jul 15, 722  716 75 year old male with complaint of shortness of breath with left-sided chest discomfort sudden onset after coughing fit last night around 10:00.  Patient appears uncomfortable, he is tachypneic, tachycardic, appears to be having hiccups, he states that this is something he is doing to try to take a deeper breath. Concern for pneumothorax with diminished left-sided lung sounds.  Portable chest x-ray pending. [LM]  [id number] Chest x-ray with metastatic process.  CT chest, abdomen, pelvis with extensive cancerous process, negative for PE, negative for pneumothorax. Patient remains tachypneic, tachycardic, slight improvement in tachypnea with supplemental O2, improvement in O2 sat from 93% to 96% on 2 L nasal cannula.  Remains  tachycardic. Labs reviewed, elevated white blood cell count of 21,000, likely secondary to cancer.  CMP with mildly elevated AST and ALT as well as bili.  Initial troponin is 10, will trend.  BNP 118, D-dimer 10.  COVID and flu negative. Case discussed with Dr. Hal Hope with [facility] hospitalist service will consult for admission. [LM]    Clinical Course User Index [LM] Roque Lias   MDM Rules/Calculators/A&P                           Final Clinical Impression(s) / ED Diagnoses Final diagnoses:  Acute respiratory distress  Hypoxia  Chest pain, unspecified type  Metastatic malignant neoplasm, unspecified site [facility])    Rx / DC Orders ED Discharge Orders     None        Tacy Learn, PA-C 08/09/20 0930    Ezequiel Essex, MD 08/09/20 2047

## 2020-08-09 NOTE — ED Notes (Signed)
Pts daughter request call if pt moves to be admitted before her return. Daughters name is [name] [phone]

## 2020-08-09 NOTE — H&P (Signed)
History and Physical    Ala Capri LFY:[id number] DOB: May 15, 1945 DOA: 08/09/2020  PCP: [name], MD  Patient coming from: Home.  Chief Complaint: Shortness of breath.  HPI: Grant Taylor is a 75 y.o. male with history of hypertension and COPD, hyperlipidemia presents to the ER after patient became acutely short of breath since last night.  Patient states he was recently diagnosed with pancreatic mass on July 26, 2020 and has follow-up at [facility] in [place] next week for further work-up.  Last night around 11 PM patient started having paroxysms of cough which was nonproductive with shortness of breath which persisted and patient decided to come to the ER.  Denies any fever chills or chest pain.  Has had some sweating spells.  ED Course: In the ER patient was short of breath tachycardic.  Heart rate was in the 130s.  CT angiogram of the chest and abdomen was done which was negative for pulm embolism but does show the pancreatic mass with widespread metastases along with lung mass and nodules.  COVID test was negative.  EKG shows sinus tachycardia with VPCs.  Labs show WBC count of 21.2 blood glucose of 164 sodium 132 high sensitive troponins were negative BNP 118.  Patient admitted for further observation given the patient is tachypneic tachycardic and requiring 2 L oxygen.  Review of Systems: As per HPI, rest all negative.   Past Medical History:  Diagnosis Date   Cancer ([place])    Chronic GERD    Constipation    ED (erectile dysfunction)    History of BPH    Hyperlipemia    Hypertension    Hypokalemia    OAB (overactive bladder)    Obstructive sleep apnea    Positive PPD    Vitamin B 12 deficiency     Past Surgical History:  Procedure Laterality Date   LAMINECTOMY AND MICRODISCECTOMY LUMBAR SPINE  2007     reports that he has quit smoking. He has never used smokeless tobacco. He reports previous alcohol use. He reports previous drug  use.  Allergies  Allergen Reactions   Levofloxacin Rash    Other reaction(s): Angioedema   Lisinopril Cough    Other reaction(s): Angioedema   Diclofenac     Other reaction(s): Lip swelling   Dust Mite Extract Nausea And Vomiting   Elemental Sulfur    Etodolac Nausea And Vomiting    Other reaction(s): Facial swelling   No Known Allergies     Other reaction(s): ANGIOEDEMA OF LIPS, Angioedema of tongue, ANGIOEDEMA OF LIPS, Angioedema of tongue   Tolterodine    Citalopram Other (See Comments)    Other reaction(s): Drowsy   Other Rash    History reviewed. No pertinent family history.  Prior to Admission medications   Medication Sig Start Date End Date Taking? Authorizing Provider  albuterol (VENTOLIN HFA) 108 (90 Base) MCG/ACT inhaler Inhale into the lungs every 6 (six) hours as needed for wheezing or shortness of breath.   Yes [provider]  amLODipine (NORVASC) 10 MG tablet TAKE ONE TABLET BY MOUTH DAILY FOR HEART 10/31/19  Yes [provider]  benzonatate (TESSALON) 100 MG capsule Take 100 mg by mouth 3 (three) times daily as needed for cough. 08/06/20  Yes [provider]  buPROPion (WELLBUTRIN XL) 300 MG 24 hr tablet Take by mouth daily.   Yes [provider]  Carboxymethylcellulose Sodium 1 % GEL Place 1 drop into both eyes 2 (two) times daily as needed (dry  eyes). 06/21/19  Yes [provider]  chlorpheniramine (CHLOR-TRIMETON) 4 MG tablet Take 4 mg by mouth daily as needed for allergies. 05/05/16  Yes [provider]  Cholecalciferol 50 MCG (2000 UT) TABS Take 2,000 Units by mouth daily. 10/31/19  Yes [provider]  cyclobenzaprine (FLEXERIL) 10 MG tablet Take 10 mg by mouth 3 (three) times daily as needed for muscle spasms.   Yes [provider]  diclofenac (VOLTAREN) 75 MG EC tablet Take 75 mg by mouth 2 (two) times daily.   Yes [provider]  diclofenac Sodium (VOLTAREN) 1 % GEL Apply 4 g  topically 4 (four) times daily as needed (pain).   Yes [provider]  docusate sodium (COLACE) 100 MG capsule Take 100 mg by mouth daily as needed for mild constipation.   Yes [provider]  famotidine (PEPCID) 20 MG tablet Take 20 mg by mouth daily.   Yes [provider]  feeding supplement (ENSURE ENLIVE / ENSURE PLUS) LIQD Take 237 mLs by mouth daily. 05/07/20  Yes [provider]  fluticasone (FLONASE) 50 MCG/ACT nasal spray Place 1 spray into both nostrils daily as needed for allergies. 10/31/19  Yes [provider]  gabapentin (NEURONTIN) 300 MG capsule Take 300 mg by mouth 3 (three) times daily. 07/26/20  Yes [provider]  guaifenesin (HUMIBID E) 400 MG TABS tablet Take 400 mg by mouth daily as needed for congestion. 05/05/16  Yes [provider]  hydrOXYzine (ATARAX/VISTARIL) 10 MG tablet Take 10 mg by mouth daily as needed for anxiety or sleep. 03/25/20  Yes [provider]  loratadine (CLARITIN) 10 MG tablet Take 10 mg by mouth daily. 10/31/19  Yes [provider]  losartan (COZAAR) 50 MG tablet Take 25 mg by mouth daily.   Yes [provider]  methocarbamol (ROBAXIN) 500 MG tablet Take 1 tablet (500 mg total) by mouth every 6 (six) hours as needed for muscle spasms. 07/05/20  Yes Kirsteins, Luanna Salk, MD  omeprazole (PRILOSEC) 40 MG capsule Take 40 mg by mouth 2 (two) times daily.   Yes [provider]  ondansetron (ZOFRAN-ODT) 4 MG disintegrating tablet Take 4 mg by mouth 3 (three) times daily as needed for nausea/vomiting. 08/07/20  Yes [provider]  oxyCODONE (OXY IR/ROXICODONE) 5 MG immediate release tablet Take 5 mg by mouth every 6 (six) hours as needed for pain. 08/07/20  Yes [provider]  polyethylene glycol (MIRALAX / GLYCOLAX) 17 g packet Take 17 g by mouth daily. 12/27/19  Yes Khatri, Hina, PA-C  potassium chloride SA (KLOR-CON) 20 MEQ tablet Take 20 mEq by mouth  2 (two) times daily. 10/31/19  Yes [provider]  pravastatin (PRAVACHOL) 80 MG tablet Take 80 mg by mouth daily.   Yes [provider]  tiZANidine (ZANAFLEX) 4 MG tablet Take 4 mg by mouth 3 (three) times daily as needed for muscle spasms. 07/26/20  Yes [provider]  gabapentin (NEURONTIN) 100 MG capsule Take 1 capsule (100 mg total) by mouth 3 (three) times daily. 07/08/20   Kirsteins, Luanna Salk, MD  lidocaine (LIDODERM) 5 % APPLY 1 PATCH TO SKIN ONCE DAILY (APPLY FOR 12 HOURS, THEN REMOVE FOR 12 HOURS) FOR  LOCALIZED WORST PAIN 10/31/19   [provider]  sildenafil (VIAGRA) 100 MG tablet Take 100 mg by mouth daily as needed for erectile dysfunction.    [provider]  sodium chloride (BRONCHO SALINE) inhaler solution Take 1 spray by nebulization as  needed (shortness of breath).    [provider]    Physical Exam: Constitutional: Moderately built and nourished. Vitals:   08/09/20 0618 08/09/20 0700 08/09/20 0743 08/09/20 0853  BP: (!) 150/78 (!) 157/106 (!) 150/89 (!) 155/90  Pulse: (!) 132 63 (!) 129 (!) 127  Resp: 20 (!) 22 (!) 24 18  Temp: 98.3 F (36.8 C)     TempSrc: Axillary     SpO2: 93% 94% 95% 96%   Eyes: Anicteric no pallor ENMT: No discharge from the ears eyes nose and mouth. Neck: Left supraclavicular node felt. Respiratory: No rhonchi or crepitations. Cardiovascular: S1-S2 heard Abdomen: Nontender bowel sounds present. Musculoskeletal: No edema. Skin: No rash. Neurologic: Alert awake oriented to time place and person.  Moves all extremities. Psychiatric: Appears normal.  Normal affect.   Labs on Admission: I have personally reviewed following labs and imaging studies  CBC: Recent Labs  Lab 08/09/20 0636  WBC 21.2*  NEUTROABS 16.5*  HGB 11.1*  HCT 34.5*  MCV 81.9  PLT 027   Basic Metabolic Panel: Recent Labs  Lab 08/09/20 0636  NA 132*  K 4.1  CL 98  CO2 21*  GLUCOSE 164*  BUN 10  CREATININE  1.09  CALCIUM 8.5*   GFR: CrCl cannot be calculated (Unknown ideal weight.). Liver Function Tests: Recent Labs  Lab 08/09/20 0636  AST 99*  ALT 65*  ALKPHOS 82  BILITOT 1.3*  PROT 6.9  ALBUMIN 2.6*   No results for input(s): LIPASE, AMYLASE in the last 168 hours. No results for input(s): AMMONIA in the last 168 hours. Coagulation Profile: No results for input(s): INR, PROTIME in the last 168 hours. Cardiac Enzymes: No results for input(s): CKTOTAL, CKMB, CKMBINDEX, TROPONINI in the last 168 hours. BNP (last 3 results) No results for input(s): PROBNP in the last 8760 hours. HbA1C: No results for input(s): HGBA1C in the last 72 hours. CBG: No results for input(s): GLUCAP in the last 168 hours. Lipid Profile: No results for input(s): CHOL, HDL, LDLCALC, TRIG, CHOLHDL, LDLDIRECT in the last 72 hours. Thyroid Function Tests: No results for input(s): TSH, T4TOTAL, FREET4, T3FREE, THYROIDAB in the last 72 hours. Anemia Panel: No results for input(s): VITAMINB12, FOLATE, FERRITIN, TIBC, IRON, RETICCTPCT in the last 72 hours. Urine analysis:    Component Value Date/Time   COLORURINE YELLOW 12/27/2019 1548   APPEARANCEUR CLOUDY (A) 12/27/2019 1548   LABSPEC 1.012 12/27/2019 1548   PHURINE 7.0 12/27/2019 1548   GLUCOSEU NEGATIVE 12/27/2019 1548   HGBUR SMALL (A) 12/27/2019 1548   BILIRUBINUR NEGATIVE 12/27/2019 1548   KETONESUR NEGATIVE 12/27/2019 1548   PROTEINUR NEGATIVE 12/27/2019 1548   NITRITE NEGATIVE 12/27/2019 1548   LEUKOCYTESUR NEGATIVE 12/27/2019 1548   Sepsis Labs: @LABRCNTIP (procalcitonin:4,lacticidven:4) ) Recent Results (from the past 240 hour(s))  Resp Panel by RT-PCR (Flu A&B, Covid) Nasopharyngeal Swab     Status: None   Collection Time: 08/09/20  6:51 AM   Specimen: Nasopharyngeal Swab; Nasopharyngeal(NP) swabs in vial transport medium  Result Value Ref Range Status   SARS Coronavirus 2 by RT PCR NEGATIVE NEGATIVE Final    Comment: (NOTE) SARS-CoV-2  target nucleic acids are NOT DETECTED.  The SARS-CoV-2 RNA is generally detectable in upper respiratory specimens during the acute phase of infection. The lowest concentration of SARS-CoV-2 viral copies this assay can detect is 138 copies/mL. A negative result does not preclude SARS-Cov-2 infection and should not be used as the sole basis for treatment or other patient management decisions. A negative  result may occur with  improper specimen collection/handling, submission of specimen other than nasopharyngeal swab, presence of viral mutation(s) within the areas targeted by this assay, and inadequate number of viral copies(<138 copies/mL). A negative result must be combined with clinical observations, patient history, and epidemiological information. The expected result is Negative.  Fact Sheet for Patients:  EntrepreneurPulse.com.au  Fact Sheet for Healthcare Providers:  IncredibleEmployment.be  This test is no t yet approved or cleared by the [place] FDA and  has been authorized for detection and/or diagnosis of SARS-CoV-2 by FDA under an Emergency Use Authorization (EUA). This EUA will remain  in effect (meaning this test can be used) for the duration of the COVID-19 declaration under Section 564(b)(1) of the Act, 21 U.S.C.section 360bbb-3(b)(1), unless the authorization is terminated  or revoked sooner.       Influenza A by PCR NEGATIVE NEGATIVE Final   Influenza B by PCR NEGATIVE NEGATIVE Final    Comment: (NOTE) The Xpert Xpress SARS-CoV-2/FLU/RSV plus assay is intended as an aid in the diagnosis of influenza from Nasopharyngeal swab specimens and should not be used as a sole basis for treatment. Nasal washings and aspirates are unacceptable for Xpert Xpress SARS-CoV-2/FLU/RSV testing.  Fact Sheet for Patients: EntrepreneurPulse.com.au  Fact Sheet for Healthcare  Providers: IncredibleEmployment.be  This test is not yet approved or cleared by the [place] FDA and has been authorized for detection and/or diagnosis of SARS-CoV-2 by FDA under an Emergency Use Authorization (EUA). This EUA will remain in effect (meaning this test can be used) for the duration of the COVID-19 declaration under Section 564(b)(1) of the Act, 21 U.S.C. section 360bbb-3(b)(1), unless the authorization is terminated or revoked.  Performed at [facility], [place] [address], [place], [place] [address]      Radiological Exams on Admission: CT Angio Chest PE W/Cm &/Or Wo Cm  Result Date: 08/09/2020 CLINICAL DATA:  Shortness of breath.  Pancreatic mass. EXAM: CT ANGIOGRAPHY CHEST WITH CONTRAST TECHNIQUE: Multidetector CT imaging of the chest was performed using the standard protocol during bolus administration of intravenous contrast. Multiplanar CT image reconstructions and MIPs were obtained to evaluate the vascular anatomy. CONTRAST:  187mL OMNIPAQUE IOHEXOL 350 MG/ML SOLN COMPARISON:  None. FINDINGS: Cardiovascular: No filling defects in the pulmonary arteries to suggest pulmonary emboli. Heart is normal size. Aorta is normal caliber. Scattered aortic calcifications. Mediastinum/Nodes: Mediastinal and bilateral hilar adenopathy. No axillary adenopathy. Index prevascular lymph node has a short axis diameter of 2.7 cm. Lungs/Pleura: Extensive innumerable bilateral pulmonary nodules and masses. Index right lower lobe mass on image 111 measures 2.6 cm. Index left lower lobe mass on image 86 measures 2.5 cm. Small left pleural effusion. Upper Abdomen: Probable peritoneal implants in the left upper lobe anterior to the spleen. See abdominal CT report for further discussion. Musculoskeletal: Chest wall soft tissues are unremarkable. No acute bony abnormality. Review of the MIP images confirms the above findings. IMPRESSION: No evidence of pulmonary  embolus. Innumerable bilateral pulmonary nodules and masses compatible metastases. Bulky mediastinal and bilateral hilar adenopathy. Small left pleural effusion. Electronically Signed   By: Rolm Baptise M.D.   On: 08/09/2020 08:55   CT Abdomen Pelvis W Contrast  Result Date: 08/09/2020 CLINICAL DATA:  Metastatic disease evaluation.  Pancreatic tumor. EXAM: CT ABDOMEN AND PELVIS WITH CONTRAST TECHNIQUE: Multidetector CT imaging of the abdomen and pelvis was performed using the standard protocol following bolus administration of intravenous contrast. CONTRAST:  135mL OMNIPAQUE IOHEXOL 350 MG/ML SOLN COMPARISON:  Chest CT today.  CT abdomen and pelvis 12/27/2019 FINDINGS: Lower chest: Innumerable masses in the lung bases. Small left pleural effusion. Hepatobiliary: Scattered low-density lesions throughout the liver which do not appear to be cystic and are concerning for metastases. Gallbladder unremarkable. Pancreas: Large mass in the pancreatic tail measures up to approximately 10 cm with extension anterior to the spleen and possible involvement of the anterior upper pole of the left kidney. Spleen: No focal abnormality.  Normal size. Adrenals/Urinary Tract: Tumor from the pancreatic tail appears to extend to and possibly involve the anterior to mid pole of the left kidney. No hydronephrosis. Right adrenal gland unremarkable. Left adrenal gland not visualized and likely encased by the pancreatic tumor. Urinary bladder unremarkable. Stomach/Bowel: Pancreatic tumor abuts the proximal descending thoracic aorta at the splenic flexure. No evidence of bowel obstruction. Vascular/Lymphatic: Aortic atherosclerosis. Large left periaortic lymph node with a short axis diameter of 3.3 cm. Other enlarged upper abdominal retroperitoneal lymph nodes. Reproductive: No visible focal abnormality. Other: Small amount of free fluid in the pelvis. Musculoskeletal: No acute bony abnormality. IMPRESSION: Large mass involving the  pancreatic tail measuring up to 10 cm with extension in the left retroperitoneum with possible involvement of the left kidney. Mass extends anterior to the spleen. Retroperitoneal adenopathy. Low-density lesions in the liver, likely metastases. Innumerable masses in the pulmonary bases compatible with metastases. Small left pleural effusion. Small amount of free fluid in the pelvis. Aortic atherosclerosis. Electronically Signed   By: Rolm Baptise M.D.   On: 08/09/2020 08:59   DG Chest Portable 1 View  Result Date: 08/09/2020 CLINICAL DATA:  75 year old male with shortness of breath. History of pancreatic tumor. EXAM: PORTABLE CHEST 1 VIEW COMPARISON:  No priors. FINDINGS: Widespread areas of interstitial prominence are noted throughout the lungs bilaterally, most evident in the mid to lower lungs, strongly suggestive of interstitial lung disease. Lung volumes are normal. In addition, there are extensive areas of nodularity throughout the lungs bilaterally also most evident throughout the mid to lower lungs, concerning for widespread metastatic disease. Small left pleural effusion. No definite right pleural effusion. No pneumothorax. No evidence of pulmonary edema. Heart size is normal. Upper mediastinal contours are within normal limits. Orthopedic fixation hardware in the lower cervical spine incompletely imaged. IMPRESSION: 1. The appearance the chest suggest widespread metastatic disease to the lungs, superimposed upon a background of chronic interstitial lung disease, as above. 2. Small left pleural effusion. Electronically Signed   By: Vinnie Langton M.D.   On: 08/09/2020 07:16    EKG: Independently reviewed.  Sinus tachycardia with VPCs.  Assessment/Plan Principal Problem:   Acute respiratory failure with hypoxia (HCC) Active Problems:   Essential hypertension   COPD (chronic obstructive pulmonary disease) (HCC)   Pancreatic mass    Acute respiratory failure with hypoxia presently requiring  2 L oxygen.  Patient is tachypneic and tachycardic.  Because of the shortness of breath is not clear.  Could be from the large metastatic masses in the lung.  Patient also has significant leukocytosis and having sweating spells.  We will get blood cultures keep patient on empiric antibiotics.  As needed nebulizer check 2D echo. 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 Hypertension we will continue home medications. Hyperlipidemia on statins. History of COPD presently not actively wheezing or we will keep patient on nebulizer as needed. Hyperglycemia -check hemoglobin A1c with next lab draw.   DVT prophylaxis: Lovenox. Code Status:  Full code. Family Communication: Patient's daughter at the bedside. Disposition Plan: Home. Consults called: None. Admission status: Inpatient.   Rise Patience MD [facility] Pager [phone].  If 7PM-7AM, please contact night-coverage [email or web address] Password [facility]  08/09/2020, 11:32 AM

## 2020-08-10 DIAGNOSIS — J9601 Acute respiratory failure with hypoxia: Secondary | ICD-10-CM | POA: Diagnosis present

## 2020-08-10 DIAGNOSIS — K8689 Other specified diseases of pancreas: Secondary | ICD-10-CM | POA: Diagnosis not present

## 2020-08-10 DIAGNOSIS — J441 Chronic obstructive pulmonary disease with (acute) exacerbation: Secondary | ICD-10-CM | POA: Diagnosis present

## 2020-08-10 DIAGNOSIS — F32A Depression, unspecified: Secondary | ICD-10-CM | POA: Diagnosis present

## 2020-08-10 DIAGNOSIS — C252 Malignant neoplasm of tail of pancreas: Secondary | ICD-10-CM | POA: Diagnosis present

## 2020-08-10 DIAGNOSIS — C7800 Secondary malignant neoplasm of unspecified lung: Secondary | ICD-10-CM | POA: Diagnosis not present

## 2020-08-10 DIAGNOSIS — N179 Acute kidney failure, unspecified: Secondary | ICD-10-CM | POA: Diagnosis not present

## 2020-08-10 DIAGNOSIS — Z888 Allergy status to other drugs, medicaments and biological substances status: Secondary | ICD-10-CM | POA: Diagnosis not present

## 2020-08-10 DIAGNOSIS — R0602 Shortness of breath: Secondary | ICD-10-CM | POA: Diagnosis present

## 2020-08-10 DIAGNOSIS — J189 Pneumonia, unspecified organism: Secondary | ICD-10-CM | POA: Diagnosis present

## 2020-08-10 DIAGNOSIS — C787 Secondary malignant neoplasm of liver and intrahepatic bile duct: Secondary | ICD-10-CM | POA: Diagnosis present

## 2020-08-10 DIAGNOSIS — Z79899 Other long term (current) drug therapy: Secondary | ICD-10-CM | POA: Diagnosis not present

## 2020-08-10 DIAGNOSIS — Z87891 Personal history of nicotine dependence: Secondary | ICD-10-CM | POA: Diagnosis not present

## 2020-08-10 DIAGNOSIS — Z20822 Contact with and (suspected) exposure to covid-19: Secondary | ICD-10-CM | POA: Diagnosis present

## 2020-08-10 DIAGNOSIS — I10 Essential (primary) hypertension: Secondary | ICD-10-CM | POA: Diagnosis present

## 2020-08-10 DIAGNOSIS — C7802 Secondary malignant neoplasm of left lung: Secondary | ICD-10-CM | POA: Diagnosis present

## 2020-08-10 DIAGNOSIS — E538 Deficiency of other specified B group vitamins: Secondary | ICD-10-CM | POA: Diagnosis present

## 2020-08-10 DIAGNOSIS — C770 Secondary and unspecified malignant neoplasm of lymph nodes of head, face and neck: Secondary | ICD-10-CM | POA: Diagnosis present

## 2020-08-10 DIAGNOSIS — C259 Malignant neoplasm of pancreas, unspecified: Secondary | ICD-10-CM | POA: Diagnosis not present

## 2020-08-10 DIAGNOSIS — A419 Sepsis, unspecified organism: Secondary | ICD-10-CM | POA: Diagnosis present

## 2020-08-10 DIAGNOSIS — R6520 Severe sepsis without septic shock: Secondary | ICD-10-CM | POA: Diagnosis present

## 2020-08-10 DIAGNOSIS — E785 Hyperlipidemia, unspecified: Secondary | ICD-10-CM | POA: Diagnosis present

## 2020-08-10 DIAGNOSIS — D649 Anemia, unspecified: Secondary | ICD-10-CM | POA: Diagnosis not present

## 2020-08-10 DIAGNOSIS — D509 Iron deficiency anemia, unspecified: Secondary | ICD-10-CM | POA: Diagnosis present

## 2020-08-10 DIAGNOSIS — Z881 Allergy status to other antibiotic agents status: Secondary | ICD-10-CM | POA: Diagnosis not present

## 2020-08-10 DIAGNOSIS — G4733 Obstructive sleep apnea (adult) (pediatric): Secondary | ICD-10-CM | POA: Diagnosis present

## 2020-08-10 DIAGNOSIS — C7801 Secondary malignant neoplasm of right lung: Secondary | ICD-10-CM | POA: Diagnosis present

## 2020-08-10 DIAGNOSIS — D638 Anemia in other chronic diseases classified elsewhere: Secondary | ICD-10-CM | POA: Diagnosis present

## 2020-08-10 DIAGNOSIS — J440 Chronic obstructive pulmonary disease with acute lower respiratory infection: Secondary | ICD-10-CM | POA: Diagnosis present

## 2020-08-10 LAB — CBC
HCT: 30.7 % — ABNORMAL LOW (ref 39.0–52.0)
Hemoglobin: 9.8 g/dL — ABNORMAL LOW (ref 13.0–17.0)
MCH: 26.5 pg (ref 26.0–34.0)
MCHC: 31.9 g/dL (ref 30.0–36.0)
MCV: 83 fL (ref 80.0–100.0)
Platelets: 309 10*3/uL (ref 150–400)
RBC: 3.7 MIL/uL — ABNORMAL LOW (ref 4.22–5.81)
RDW: 16.8 % — ABNORMAL HIGH (ref 11.5–15.5)
WBC: 14.5 10*3/uL — ABNORMAL HIGH (ref 4.0–10.5)
nRBC: 0 % (ref 0.0–0.2)

## 2020-08-10 LAB — EXPECTORATED SPUTUM ASSESSMENT W GRAM STAIN, RFLX TO RESP C

## 2020-08-10 LAB — BASIC METABOLIC PANEL
Anion gap: 8 (ref 5–15)
BUN: 12 mg/dL (ref 8–23)
CO2: 26 mmol/L (ref 22–32)
Calcium: 8.7 mg/dL — ABNORMAL LOW (ref 8.9–10.3)
Chloride: 103 mmol/L (ref 98–111)
Creatinine, Ser: 1.02 mg/dL (ref 0.61–1.24)
GFR, Estimated: >60 mL/min (ref >60)
Glucose, Bld: 130 mg/dL — ABNORMAL HIGH (ref 70–99)
Potassium: 4 mmol/L (ref 3.5–5.1)
Sodium: 137 mmol/L (ref 135–145)

## 2020-08-10 LAB — PROCALCITONIN: Procalcitonin: 0.33 ng/mL

## 2020-08-10 MED ORDER — VITAMIN D3 25 MCG (1000 UNIT) PO TABS
2000.0000 [IU] | ORAL_TABLET | Freq: Every day | ORAL | Status: DC
  Administered 2020-08-10 – 2020-08-22 (×13): 2000 [IU] via ORAL
  Filled 2020-08-10 (×13): qty 2

## 2020-08-10 MED ORDER — BENZONATATE 100 MG PO CAPS
100.0000 mg | ORAL_CAPSULE | Freq: Three times a day (TID) | ORAL | Status: DC | PRN
  Administered 2020-08-10 – 2020-08-14 (×8): 100 mg via ORAL
  Filled 2020-08-10 (×8): qty 1

## 2020-08-10 MED ORDER — OXYCODONE HCL 5 MG PO TABS
5.0000 mg | ORAL_TABLET | ORAL | Status: DC | PRN
  Administered 2020-08-10 – 2020-08-15 (×18): 5 mg via ORAL
  Filled 2020-08-10 (×18): qty 1

## 2020-08-10 MED ORDER — GUAIFENESIN 200 MG PO TABS
400.0000 mg | ORAL_TABLET | Freq: Every day | ORAL | Status: DC | PRN
  Administered 2020-08-10 – 2020-08-20 (×2): 400 mg via ORAL
  Filled 2020-08-10 (×4): qty 2

## 2020-08-10 MED ORDER — CYCLOBENZAPRINE HCL 10 MG PO TABS
10.0000 mg | ORAL_TABLET | Freq: Three times a day (TID) | ORAL | Status: DC | PRN
  Administered 2020-08-12 – 2020-08-15 (×6): 10 mg via ORAL
  Filled 2020-08-10 (×6): qty 1

## 2020-08-10 MED ORDER — LORATADINE 10 MG PO TABS
10.0000 mg | ORAL_TABLET | Freq: Every day | ORAL | Status: DC
  Administered 2020-08-10 – 2020-08-22 (×13): 10 mg via ORAL
  Filled 2020-08-10 (×13): qty 1

## 2020-08-10 NOTE — Progress Notes (Signed)
Will continue frequent v/s and yellow mews protocol.

## 2020-08-10 NOTE — Progress Notes (Signed)
PROGRESS NOTE    Grant Taylor  UJW:[id number] DOB: December 13, 1945 DOA: 08/09/2020 PCP: [name], MD   Brief Narrative:  HPI: Grant Taylor is a 75 y.o. male with history of hypertension and COPD, hyperlipidemia presents to the ER after patient became acutely short of breath since last night.  Patient states he was recently diagnosed with pancreatic mass on July 26, 2020 and has follow-up at [facility] in [place] next week for further work-up.  Last night around 11 PM patient started having paroxysms of cough which was nonproductive with shortness of breath which persisted and patient decided to come to the ER.  Denies any fever chills or chest pain.  Has had some sweating spells.   ED Course: In the ER patient was short of breath tachycardic.  Heart rate was in the 130s.  CT angiogram of the chest and abdomen was done which was negative for pulm embolism but does show the pancreatic mass with widespread metastases along with lung mass and nodules.  COVID test was negative.  EKG shows sinus tachycardia with VPCs.  Labs show WBC count of 21.2 blood glucose of 164 sodium 132 high sensitive troponins were negative BNP 118.  Patient admitted for further observation given the patient is tachypneic tachycardic and requiring 2 L oxygen.  Assessment & Plan:   Principal Problem:   Acute respiratory failure with hypoxia (HCC) Active Problems:   Essential hypertension   COPD (chronic obstructive pulmonary disease) (HCC)   Pancreatic mass   Severe sepsis (HCC)  Severe sepsis/acute hypoxic respiratory failure secondary to community-acquired pneumonia and lung metastasis: CT angiogram ruled out PE, and also did not show clear-cut infiltrates however it is hard to see infiltrates with lung metastasis, procalcitonin 0.33 and temperature of 100.4 this morning.  I think he does have pneumonia.  He also meets criteria for severe sepsis based on tachycardia, tachypnea and acute hypoxic  respiratory failure.  We will continue Rocephin and Zithromax.  We will check sputum culture, urine Legionella and streptococci.  Try to wean oxygen.  Newly diagnosed pancreatic cancer with lung metastasis/bilateral flank pain: He has appointment with his [place] oncologist next week.  He will follow-up with them as outpatient.  His bilateral flank pain is likely due to pulmonary metastasis and large mass at the tail of the pancreas which extends to the retroperitoneum and possibly to the left kidney as mentioned in CT abdomen pelvis..  Currently on oxycodone immediate release every 6 hours as needed.  He is requesting to increase frequency to every 4 hours, that is what he was taking at home.  We will do that.  Essential hypertension: Controlled.  Continue amlodipine and losartan.  Hyperlipidemia: Continue statin.  History of COPD: Not in acute exacerbation.  Continue bronchodilators/Xopenex.  DVT prophylaxis: enoxaparin (LOVENOX) injection 40 mg Start: 08/09/20 1200   Code Status: Full Code  Family Communication: None present at bedside.  Plan of care discussed with patient in length and he verbalized understanding and agreed with it.  Status is: Observation  The patient will require care spanning > 2 midnights and should be moved to inpatient because: Inpatient level of care appropriate due to severity of illness  Dispo: The patient is from: Home              Anticipated d/c is to: Home              Patient currently is not medically stable to d/c.   Difficult to place patient No  Estimated body mass index is 22.03 kg/m as calculated from the following:   Height as of this encounter: 5\' 7"  (1.702 m).   Weight as of this encounter: 63.8 kg.      Nutritional status:               Consultants:  None  Procedures:  None  Antimicrobials:  Anti-infectives (From admission, onward)    Start     Dose/Rate Route Frequency Ordered Stop   08/09/20 1200  cefTRIAXone  (ROCEPHIN) 2 g in sodium chloride 0.9 % 100 mL IVPB        2 g 200 mL/hr over 30 Minutes Intravenous Every 24 hours 08/09/20 1142 08/14/20 1159   08/09/20 1200  azithromycin (ZITHROMAX) 500 mg in sodium chloride 0.9 % 250 mL IVPB        500 mg 250 mL/hr over 60 Minutes Intravenous Daily 08/09/20 1142 08/14/20 0959          Subjective: Seen and examined.  Complains of some shortness of breath but improved compared to yesterday.  Complains of bilateral flank pain.  No other complaint.  Objective: Vitals:   08/09/20 1536 08/09/20 2020 08/10/20 0037 08/10/20 0458  BP:  126/85 (!) 144/94 130/86  Pulse:  60 (!) 116 (!) 114  Resp:  18 18 16   Temp:  100 F (37.8 C) (!) 100.4 F (38 C) 99.6 F (37.6 C)  TempSrc:  Oral Oral Oral  SpO2:  93% 95% 92%  Weight: 63.8 kg     Height: 5\' 7"  (1.702 m)       Intake/Output Summary (Last 24 hours) at 08/10/2020 1029 Last data filed at 08/10/2020 1000 Gross per 24 hour  Intake 66.85 ml  Output 800 ml  Net -733.15 ml   Filed Weights   08/09/20 1536  Weight: 63.8 kg    Examination:  General exam: Appears calm and comfortable  Respiratory system: Rhonchi at the right base. Respiratory effort normal. Cardiovascular system: S1 & S2 heard, RRR. No JVD, murmurs, rubs, gallops or clicks. No pedal edema. Gastrointestinal system: Abdomen is nondistended, soft and nontender. No organomegaly or masses felt. Normal bowel sounds heard.  Bilateral CVA tenderness Central nervous system: Alert and oriented. No focal neurological deficits. Extremities: Symmetric 5 x 5 power. Skin: No rashes, lesions or ulcers Psychiatry: Judgement and insight appear normal. Mood & affect appropriate.    Data Reviewed: I have personally reviewed following labs and imaging studies  CBC: Recent Labs  Lab 08/09/20 0636 08/10/20 0330  WBC 21.2* 14.5*  NEUTROABS 16.5*  --   HGB 11.1* 9.8*  HCT 34.5* 30.7*  MCV 81.9 83.0  PLT 355 657   Basic Metabolic  Panel: Recent Labs  Lab 08/09/20 0636 08/10/20 0330  NA 132* 137  K 4.1 4.0  CL 98 103  CO2 21* 26  GLUCOSE 164* 130*  BUN 10 12  CREATININE 1.09 1.02  CALCIUM 8.5* 8.7*   GFR: Estimated Creatinine Clearance: 55.6 mL/min (by C-G formula based on SCr of 1.02 mg/dL). Liver Function Tests: Recent Labs  Lab 08/09/20 0636  AST 99*  ALT 65*  ALKPHOS 82  BILITOT 1.3*  PROT 6.9  ALBUMIN 2.6*   No results for input(s): LIPASE, AMYLASE in the last 168 hours. No results for input(s): AMMONIA in the last 168 hours. Coagulation Profile: No results for input(s): INR, PROTIME in the last 168 hours. Cardiac Enzymes: No results for input(s): CKTOTAL, CKMB, CKMBINDEX, TROPONINI in the last 168 hours. BNP (last  3 results) No results for input(s): PROBNP in the last 8760 hours. HbA1C: No results for input(s): HGBA1C in the last 72 hours. CBG: No results for input(s): GLUCAP in the last 168 hours. Lipid Profile: No results for input(s): CHOL, HDL, LDLCALC, TRIG, CHOLHDL, LDLDIRECT in the last 72 hours. Thyroid Function Tests: Recent Labs    08/09/20 1230  TSH 1.388   Anemia Panel: No results for input(s): VITAMINB12, FOLATE, FERRITIN, TIBC, IRON, RETICCTPCT in the last 72 hours. Sepsis Labs: Recent Labs  Lab 08/10/20 0753  PROCALCITON 0.33    Recent Results (from the past 240 hour(s))  Resp Panel by RT-PCR (Flu A&B, Covid) Nasopharyngeal Swab     Status: None   Collection Time: 08/09/20  6:51 AM   Specimen: Nasopharyngeal Swab; Nasopharyngeal(NP) swabs in vial transport medium  Result Value Ref Range Status   SARS Coronavirus 2 by RT PCR NEGATIVE NEGATIVE Final    Comment: (NOTE) SARS-CoV-2 target nucleic acids are NOT DETECTED.  The SARS-CoV-2 RNA is generally detectable in upper respiratory specimens during the acute phase of infection. The lowest concentration of SARS-CoV-2 viral copies this assay can detect is 138 copies/mL. A negative result does not preclude  SARS-Cov-2 infection and should not be used as the sole basis for treatment or other patient management decisions. A negative result may occur with  improper specimen collection/handling, submission of specimen other than nasopharyngeal swab, presence of viral mutation(s) within the areas targeted by this assay, and inadequate number of viral copies(<138 copies/mL). A negative result must be combined with clinical observations, patient history, and epidemiological information. The expected result is Negative.  Fact Sheet for Patients:  EntrepreneurPulse.com.au  Fact Sheet for Healthcare Providers:  IncredibleEmployment.be  This test is no t yet approved or cleared by the [place] FDA and  has been authorized for detection and/or diagnosis of SARS-CoV-2 by FDA under an Emergency Use Authorization (EUA). This EUA will remain  in effect (meaning this test can be used) for the duration of the COVID-19 declaration under Section 564(b)(1) of the Act, 21 U.S.C.section 360bbb-3(b)(1), unless the authorization is terminated  or revoked sooner.       Influenza A by PCR NEGATIVE NEGATIVE Final   Influenza B by PCR NEGATIVE NEGATIVE Final    Comment: (NOTE) The Xpert Xpress SARS-CoV-2/FLU/RSV plus assay is intended as an aid in the diagnosis of influenza from Nasopharyngeal swab specimens and should not be used as a sole basis for treatment. Nasal washings and aspirates are unacceptable for Xpert Xpress SARS-CoV-2/FLU/RSV testing.  Fact Sheet for Patients: EntrepreneurPulse.com.au  Fact Sheet for Healthcare Providers: IncredibleEmployment.be  This test is not yet approved or cleared by the [place] FDA and has been authorized for detection and/or diagnosis of SARS-CoV-2 by FDA under an Emergency Use Authorization (EUA). This EUA will remain in effect (meaning this test can be used) for the duration of  the COVID-19 declaration under Section 564(b)(1) of the Act, 21 U.S.C. section 360bbb-3(b)(1), unless the authorization is terminated or revoked.  Performed at [facility], [place] [address] [place], [place] [address]       Radiology Studies: CT Angio Chest PE W/Cm &/Or Wo Cm  Result Date: 08/09/2020 CLINICAL DATA:  Shortness of breath.  Pancreatic mass. EXAM: CT ANGIOGRAPHY CHEST WITH CONTRAST TECHNIQUE: Multidetector CT imaging of the chest was performed using the standard protocol during bolus administration of intravenous contrast. Multiplanar CT image reconstructions and MIPs were obtained to evaluate the vascular anatomy. CONTRAST:  156mL  OMNIPAQUE IOHEXOL 350 MG/ML SOLN COMPARISON:  None. FINDINGS: Cardiovascular: No filling defects in the pulmonary arteries to suggest pulmonary emboli. Heart is normal size. Aorta is normal caliber. Scattered aortic calcifications. Mediastinum/Nodes: Mediastinal and bilateral hilar adenopathy. No axillary adenopathy. Index prevascular lymph node has a short axis diameter of 2.7 cm. Lungs/Pleura: Extensive innumerable bilateral pulmonary nodules and masses. Index right lower lobe mass on image 111 measures 2.6 cm. Index left lower lobe mass on image 86 measures 2.5 cm. Small left pleural effusion. Upper Abdomen: Probable peritoneal implants in the left upper lobe anterior to the spleen. See abdominal CT report for further discussion. Musculoskeletal: Chest wall soft tissues are unremarkable. No acute bony abnormality. Review of the MIP images confirms the above findings. IMPRESSION: No evidence of pulmonary embolus. Innumerable bilateral pulmonary nodules and masses compatible metastases. Bulky mediastinal and bilateral hilar adenopathy. Small left pleural effusion. Electronically Signed   By: Rolm Baptise M.D.   On: 08/09/2020 08:55   CT Abdomen Pelvis W Contrast  Result Date: 08/09/2020 CLINICAL DATA:  Metastatic disease evaluation.   Pancreatic tumor. EXAM: CT ABDOMEN AND PELVIS WITH CONTRAST TECHNIQUE: Multidetector CT imaging of the abdomen and pelvis was performed using the standard protocol following bolus administration of intravenous contrast. CONTRAST:  128mL OMNIPAQUE IOHEXOL 350 MG/ML SOLN COMPARISON:  Chest CT today.  CT abdomen and pelvis 12/27/2019 FINDINGS: Lower chest: Innumerable masses in the lung bases. Small left pleural effusion. Hepatobiliary: Scattered low-density lesions throughout the liver which do not appear to be cystic and are concerning for metastases. Gallbladder unremarkable. Pancreas: Large mass in the pancreatic tail measures up to approximately 10 cm with extension anterior to the spleen and possible involvement of the anterior upper pole of the left kidney. Spleen: No focal abnormality.  Normal size. Adrenals/Urinary Tract: Tumor from the pancreatic tail appears to extend to and possibly involve the anterior to mid pole of the left kidney. No hydronephrosis. Right adrenal gland unremarkable. Left adrenal gland not visualized and likely encased by the pancreatic tumor. Urinary bladder unremarkable. Stomach/Bowel: Pancreatic tumor abuts the proximal descending thoracic aorta at the splenic flexure. No evidence of bowel obstruction. Vascular/Lymphatic: Aortic atherosclerosis. Large left periaortic lymph node with a short axis diameter of 3.3 cm. Other enlarged upper abdominal retroperitoneal lymph nodes. Reproductive: No visible focal abnormality. Other: Small amount of free fluid in the pelvis. Musculoskeletal: No acute bony abnormality. IMPRESSION: Large mass involving the pancreatic tail measuring up to 10 cm with extension in the left retroperitoneum with possible involvement of the left kidney. Mass extends anterior to the spleen. Retroperitoneal adenopathy. Low-density lesions in the liver, likely metastases. Innumerable masses in the pulmonary bases compatible with metastases. Small left pleural effusion.  Small amount of free fluid in the pelvis. Aortic atherosclerosis. Electronically Signed   By: Rolm Baptise M.D.   On: 08/09/2020 08:59   DG Chest Portable 1 View  Result Date: 08/09/2020 CLINICAL DATA:  75 year old male with shortness of breath. History of pancreatic tumor. EXAM: PORTABLE CHEST 1 VIEW COMPARISON:  No priors. FINDINGS: Widespread areas of interstitial prominence are noted throughout the lungs bilaterally, most evident in the mid to lower lungs, strongly suggestive of interstitial lung disease. Lung volumes are normal. In addition, there are extensive areas of nodularity throughout the lungs bilaterally also most evident throughout the mid to lower lungs, concerning for widespread metastatic disease. Small left pleural effusion. No definite right pleural effusion. No pneumothorax. No evidence of pulmonary edema. Heart size is normal. Upper mediastinal contours  are within normal limits. Orthopedic fixation hardware in the lower cervical spine incompletely imaged. IMPRESSION: 1. The appearance the chest suggest widespread metastatic disease to the lungs, superimposed upon a background of chronic interstitial lung disease, as above. 2. Small left pleural effusion. Electronically Signed   By: Vinnie Langton M.D.   On: 08/09/2020 07:16   ECHOCARDIOGRAM COMPLETE  Result Date: 08/09/2020    ECHOCARDIOGRAM REPORT   Patient Name:   PRABHAV FAULKENBERRY Date of Exam: 08/09/2020 Medical Rec #:  [id number]                Height:       67.0 in Accession #:    [id number]               Weight:       140.7 lb Date of Birth:  [birth year]                BSA:          1.741 m Patient Age:    43 years                 BP:           136/96 mmHg Patient Gender: M                        HR:           111 bpm. Exam Location:  Inpatient Procedure: 2D Echo, Cardiac Doppler and Color Doppler Indications:    Dyspnea  History:        Patient has no prior history of Echocardiogram examinations.  Sonographer:    Merrie Roof RDCS Referring Phys: [place]  1. Left ventricular ejection fraction, by estimation, is 55 to 60%. The left ventricle has normal function. The left ventricle has no regional wall motion abnormalities. Left ventricular diastolic parameters are consistent with Grade I diastolic dysfunction (impaired relaxation).  2. Right ventricular systolic function is normal. The right ventricular size is normal. There is normal pulmonary artery systolic pressure.  3. The mitral valve is normal in structure. Trivial mitral valve regurgitation. No evidence of mitral stenosis.  4. The aortic valve is tricuspid. There is mild calcification of the aortic valve. There is mild thickening of the aortic valve. Aortic valve regurgitation is not visualized. Mild to moderate aortic valve sclerosis/calcification is present, without any evidence of aortic stenosis.  5. The inferior vena cava is normal in size with greater than 50% respiratory variability, suggesting right atrial pressure of 3 mmHg. Comparison(s): No prior Echocardiogram. FINDINGS  Left Ventricle: Left ventricular ejection fraction, by estimation, is 55 to 60%. The left ventricle has normal function. The left ventricle has no regional wall motion abnormalities. The left ventricular internal cavity size was normal in size. There is  borderline concentric left ventricular hypertrophy. Left ventricular diastolic parameters are consistent with Grade I diastolic dysfunction (impaired relaxation). Normal left ventricular filling pressure. Right Ventricle: The right ventricular size is normal. No increase in right ventricular wall thickness. Right ventricular systolic function is normal. There is normal pulmonary artery systolic pressure. The tricuspid regurgitant velocity is 2.80 m/s, and  with an assumed right atrial pressure of 3 mmHg, the estimated right ventricular systolic pressure is 21.1 mmHg. Left Atrium: Left atrial size was normal in size.  Right Atrium: Right atrial size was normal in size. Pericardium: There is no evidence of pericardial effusion. Mitral Valve: The mitral valve is  normal in structure. There is mild thickening of the mitral valve leaflet(s). There is mild calcification of the mitral valve leaflet(s). Mild mitral annular calcification. Trivial mitral valve regurgitation. No evidence  of mitral valve stenosis. Tricuspid Valve: The tricuspid valve is normal in structure. Tricuspid valve regurgitation is trivial. Aortic Valve: The aortic valve is tricuspid. There is mild calcification of the aortic valve. There is mild thickening of the aortic valve. Aortic valve regurgitation is not visualized. Mild to moderate aortic valve sclerosis/calcification is present, without any evidence of aortic stenosis. Pulmonic Valve: The pulmonic valve was normal in structure. Pulmonic valve regurgitation is trivial. Aorta: The aortic root and ascending aorta are structurally normal, with no evidence of dilitation. Venous: The inferior vena cava is normal in size with greater than 50% respiratory variability, suggesting right atrial pressure of 3 mmHg. IAS/Shunts: No atrial level shunt detected by color flow Doppler.  LEFT VENTRICLE PLAX 2D LVIDd:         2.97 cm  Diastology LVIDs:         2.18 cm  LV e' medial:  8.05 cm/s LV PW:         1.06 cm  LV e' lateral: 8.59 cm/s LV IVS:        0.87 cm LVOT diam:     1.90 cm LV SV:         66 LV SV Index:   38 LVOT Area:     2.84 cm  RIGHT VENTRICLE RV Basal diam:  3.60 cm LEFT ATRIUM             Index       RIGHT ATRIUM           Index LA diam:        2.80 cm 1.61 cm/m  RA Area:     16.40 cm LA Vol (A2C):   49.4 ml 28.37 ml/m RA Volume:   37.50 ml  21.54 ml/m LA Vol (A4C):   57.5 ml 33.02 ml/m LA Biplane Vol: 53.2 ml 30.55 ml/m  AORTIC VALVE LVOT Vmax:   154.00 cm/s LVOT Vmean:  113.000 cm/s LVOT VTI:    0.232 m  AORTA Ao Root diam: 3.10 cm Ao Asc diam:  3.50 cm TRICUSPID VALVE TR Peak grad:   31.4 mmHg TR  Vmax:        280.00 cm/s  SHUNTS Systemic VTI:  0.23 m Systemic Diam: 1.90 cm Gwyndolyn Kaufman MD Electronically signed by Gwyndolyn Kaufman MD Signature Date/Time: 08/09/2020/5:00:32 PM    Final     Scheduled Meds:  amLODipine  10 mg Oral Daily   buPROPion  300 mg Oral Daily   enoxaparin (LOVENOX) injection  40 mg Subcutaneous Daily   famotidine  20 mg Oral Daily   feeding supplement  237 mL Oral Daily   gabapentin  300 mg Oral TID   losartan  25 mg Oral Daily   pantoprazole  40 mg Oral Daily   polyethylene glycol  17 g Oral Daily   pravastatin  80 mg Oral Daily   Continuous Infusions:  azithromycin 250 mL/hr at 08/10/20 1000   cefTRIAXone (ROCEPHIN)  IV 2 g (08/09/20 1216)     LOS: 0 days   Time spent: 36 minutes   Darliss Cheney, MD [facility]  08/10/2020, 10:29 AM   How to contact the [facility] Attending or Consulting provider [place] or covering provider during after hours [place], for this patient?  Check the care team in [facility]  and look for a) attending/consulting [place] provider listed and b) the [facility] team listed. Page or secure chat [phone]. Log into [email or web address] and use [place] universal password to access. If you do not have the password, please contact the hospital operator. Locate the [facility] provider you are looking for under [facility] and page to a number that you can be directly reached. If you still have difficulty reaching the provider, please page the [facility] (Director on Call) for the Hospitalists listed on amion for assistance.

## 2020-08-11 DIAGNOSIS — J9601 Acute respiratory failure with hypoxia: Secondary | ICD-10-CM | POA: Diagnosis not present

## 2020-08-11 LAB — CBC WITH DIFFERENTIAL/PLATELET
Abs Immature Granulocytes: 0.04 10*3/uL (ref 0.00–0.07)
Basophils Absolute: 0 10*3/uL (ref 0.0–0.1)
Basophils Relative: 0 %
Eosinophils Absolute: 0.2 10*3/uL (ref 0.0–0.5)
Eosinophils Relative: 2 %
HCT: 30.2 % — ABNORMAL LOW (ref 39.0–52.0)
Hemoglobin: 9.5 g/dL — ABNORMAL LOW (ref 13.0–17.0)
Immature Granulocytes: 0 %
Lymphocytes Relative: 16 %
Lymphs Abs: 1.9 10*3/uL (ref 0.7–4.0)
MCH: 26.1 pg (ref 26.0–34.0)
MCHC: 31.5 g/dL (ref 30.0–36.0)
MCV: 83 fL (ref 80.0–100.0)
Monocytes Absolute: 1.4 10*3/uL — ABNORMAL HIGH (ref 0.1–1.0)
Monocytes Relative: 11 %
Neutro Abs: 8.8 10*3/uL — ABNORMAL HIGH (ref 1.7–7.7)
Neutrophils Relative %: 71 %
Platelets: 339 10*3/uL (ref 150–400)
RBC: 3.64 MIL/uL — ABNORMAL LOW (ref 4.22–5.81)
RDW: 16.6 % — ABNORMAL HIGH (ref 11.5–15.5)
WBC: 12.3 10*3/uL — ABNORMAL HIGH (ref 4.0–10.5)
nRBC: 0 % (ref 0.0–0.2)

## 2020-08-11 LAB — BASIC METABOLIC PANEL
Anion gap: 9 (ref 5–15)
BUN: 13 mg/dL (ref 8–23)
CO2: 25 mmol/L (ref 22–32)
Calcium: 8.6 mg/dL — ABNORMAL LOW (ref 8.9–10.3)
Chloride: 99 mmol/L (ref 98–111)
Creatinine, Ser: 1.11 mg/dL (ref 0.61–1.24)
GFR, Estimated: >60 mL/min (ref >60)
Glucose, Bld: 116 mg/dL — ABNORMAL HIGH (ref 70–99)
Potassium: 4 mmol/L (ref 3.5–5.1)
Sodium: 133 mmol/L — ABNORMAL LOW (ref 135–145)

## 2020-08-11 LAB — STREP PNEUMONIAE URINARY ANTIGEN: Strep Pneumo Urinary Antigen: NEGATIVE

## 2020-08-11 MED ORDER — ORAL CARE MOUTH RINSE
15.0000 mL | Freq: Two times a day (BID) | OROMUCOSAL | Status: DC
  Administered 2020-08-11 – 2020-08-22 (×16): 15 mL via OROMUCOSAL

## 2020-08-11 MED ORDER — CHLORHEXIDINE GLUCONATE 0.12 % MT SOLN
15.0000 mL | Freq: Two times a day (BID) | OROMUCOSAL | Status: DC
  Administered 2020-08-11 – 2020-08-22 (×23): 15 mL via OROMUCOSAL
  Filled 2020-08-11 (×22): qty 15

## 2020-08-11 MED ORDER — SODIUM CHLORIDE 0.9 % IV SOLN
INTRAVENOUS | Status: DC | PRN
  Administered 2020-08-11: 1000 mL via INTRAVENOUS

## 2020-08-11 MED ORDER — AZITHROMYCIN 250 MG PO TABS
500.0000 mg | ORAL_TABLET | Freq: Every day | ORAL | Status: AC
  Administered 2020-08-12 – 2020-08-13 (×2): 500 mg via ORAL
  Filled 2020-08-11 (×2): qty 2

## 2020-08-11 NOTE — TOC Initial Note (Signed)
Transition of Care [facility]) - Initial/Assessment Note    Patient Details  Name: Grant Taylor MRN: [id number] Date of Birth: 02-28-1945  Transition of Care [facility]) CM/SW Contact:    Dessa Phi, RN Phone Number: 08/11/2020, 12:54 PM  Clinical Narrative: Damaris Schooner to dtr [place](from [place])-d/c plan home,she will stay with patient, [facility] Crist[facility] Folks is not open on weekends. Noted on 02-will need 02 sats, & home 02 order to send to [place].                  Expected Discharge Plan: Home/Self Care Barriers to Discharge: Continued Medical Work up   Patient Goals and CMS Choice Patient states their goals for this hospitalization and ongoing recovery are:: go home CMS Medicare.gov Compare Post Acute Care list provided to:: Patient Represenative (must comment) ([place] dtr [phone]) Choice offered to / list presented to : Adult Children  Expected Discharge Plan and Services Expected Discharge Plan: Home/Self Care   Discharge Planning Services: CM Consult Post Acute Care Choice: Durable Medical Equipment Living arrangements for the past 2 months: Single Family Home                                      Prior Living Arrangements/Services Living arrangements for the past 2 months: Single Family Home Lives with:: Adult Children Patient language and need for interpreter reviewed:: Yes Do you feel safe going back to the place where you live?: Yes      Need for Family Participation in Patient Care: No (Comment) Care giver support system in place?: Yes (comment) Current home services: DME (cane) Criminal Activity/Legal Involvement Pertinent to Current Situation/Hospitalization: No - Comment as needed  Activities of Daily Living Home Assistive Devices/Equipment: Crutches, Other (Comment), Eyeglasses (forearm crutch, ramp entrance) ADL Screening (condition at time of admission) Patient's cognitive ability adequate to safely complete daily  activities?: No (patient very short of breath) Is the patient deaf or have difficulty hearing?: No Does the patient have difficulty seeing, even when wearing glasses/contacts?: No Does the patient have difficulty concentrating, remembering, or making decisions?: No Patient able to express need for assistance with ADLs?: Yes Does the patient have difficulty dressing or bathing?: Yes Independently performs ADLs?: No Communication: Independent Dressing (OT): Needs assistance Is this a change from baseline?: Change from baseline, expected to last >3 days Grooming: Needs assistance Is this a change from baseline?: Change from baseline, expected to last >3 days Feeding: Needs assistance Is this a change from baseline?: Change from baseline, expected to last >3 days Bathing: Needs assistance Is this a change from baseline?: Change from baseline, expected to last >3 days Toileting: Needs assistance Is this a change from baseline?: Change from baseline, expected to last >3days In/Out Bed: Needs assistance Is this a change from baseline?: Change from baseline, expected to last >3 days Walks in Home: Needs assistance Is this a change from baseline?: Change from baseline, expected to last >3 days Does the patient have difficulty walking or climbing stairs?: Yes (secondary to shortness of breath) Weakness of Legs: Both Weakness of Arms/Hands: None  Permission Sought/Granted Permission sought to share information with : Case Manager Permission granted to share information with : Yes, Verbal Permission Granted  Share Information with NAME: Case manager           Emotional Assessment Appearance:: Appears stated age Attitude/Demeanor/Rapport: Gracious Affect (typically observed): Accepting Orientation: : Oriented to  Self, Oriented to Place, Oriented to  Time, Oriented to Situation Alcohol / Substance Use: Not Applicable Psych Involvement: No (comment)  Admission diagnosis:  Acute respiratory  distress [R06.03] Hypoxia [R09.02] Acute respiratory failure with hypoxia (HCC) [J96.01] Chest pain, unspecified type [R07.9] Metastatic malignant neoplasm, unspecified site [facility]) [C79.9] Severe sepsis ([place]) [A41.9, R65.20] Patient Active Problem List   Diagnosis Date Noted   Severe sepsis ([place]) 08/10/2020   Acute respiratory failure with hypoxia ([place]) 08/09/2020   Essential hypertension 08/09/2020   COPD (chronic obstructive pulmonary disease) ([place]) 08/09/2020   Pancreatic mass 08/09/2020   PCP:  [name], MD Pharmacy:   [facility]ORE [place], [place] AT [facility] [place] [address] Phone: [phone] Fax: [phone]     Social Determinants of Health (SDOH) Interventions    Readmission Risk Interventions No flowsheet data found.

## 2020-08-11 NOTE — Progress Notes (Signed)
PROGRESS NOTE    Grant Taylor  TIR:[id number] DOB: [birth year] DOA: 08/09/2020 PCP: [name], MD   Brief Narrative:  HPI: Grant Taylor is a 75 y.o. male with history of hypertension and COPD, hyperlipidemia presents to the ER after patient became acutely short of breath since last night.  Patient states he was recently diagnosed with pancreatic mass on July 26, 2020 and has follow-up at [facility] next week for further work-up.  Last night around 11 PM patient started having paroxysms of cough which was nonproductive with shortness of breath which persisted and patient decided to come to the ER.  Denies any fever chills or chest pain.  Has had some sweating spells.   Assessment & Plan:   Principal Problem:   Acute respiratory failure with hypoxia (HCC) Active Problems:   Essential hypertension   COPD (chronic obstructive pulmonary disease) (HCC)   Pancreatic mass   Severe sepsis (HCC)  Severe sepsis/acute hypoxic respiratory failure secondary to community-acquired pneumonia and lung metastasis: CT angiogram ruled out PE, and also did not show clear-cut infiltrates however it is hard to see infiltrates with lung metastasis, procalcitonin 0.33 and temperature of 100.4 this morning.  Per Dr. Doristine Bosworth: he does have pneumonia.  He also meets criteria for severe sepsis based on tachycardia, tachypnea and acute hypoxic respiratory failure.  We will continue Rocephin and Zithromax.  We will check sputum culture, urine Legionella and streptococci.  -home O2 study ordered, may need to be d/c'd on O2  Newly diagnosed pancreatic cancer with lung metastasis/bilateral flank pain: He has appointment with his [place] oncologist next week.  He will follow-up with them as outpatient.  His bilateral flank pain is likely due to pulmonary metastasis and large mass at the tail of the pancreas which extends to the retroperitoneum and possibly to the left kidney as mentioned in CT abdomen pelvis..    -continue home oxy -? Due to get lymph node biopsied at that time?  Essential hypertension: Controlled.  Continue amlodipine and losartan.  Hyperlipidemia: Continue statin.  History of COPD: Not in acute exacerbation.  Continue bronchodilators/Xopenex.  DVT prophylaxis: enoxaparin (LOVENOX) injection 40 mg Start: 08/09/20 1200   Code Status: Full Code   Dispo: The patient is from: Home              Anticipated d/c is to: Home              Patient currently is not medically stable to d/c.- will need O2   Difficult to place patient No      Consultants:  None   Antimicrobials:  Anti-infectives (From admission, onward)    Start     Dose/Rate Route Frequency Ordered Stop   08/09/20 1200  cefTRIAXone (ROCEPHIN) 2 g in sodium chloride 0.9 % 100 mL IVPB        2 g 200 mL/hr over 30 Minutes Intravenous Every 24 hours 08/09/20 1142 08/14/20 1159   08/09/20 1200  azithromycin (ZITHROMAX) 500 mg in sodium chloride 0.9 % 250 mL IVPB        500 mg 250 mL/hr over 60 Minutes Intravenous Daily 08/09/20 1142 08/14/20 0959          Subjective: Flank pain better-- still with hiccups and cough  Objective: Vitals:   08/10/20 2103 08/10/20 2117 08/11/20 0559 08/11/20 1119  BP:  123/82 (!) 143/97   Pulse:  (!) 110 (!) 110   Resp: 19 20 20    Temp:  98.3 F (36.8 C) 99.3  F (37.4 C)   TempSrc:  Oral Oral   SpO2:  95% 90% 90%  Weight:      Height:        Intake/Output Summary (Last 24 hours) at 08/11/2020 1229 Last data filed at 08/11/2020 0700 Gross per 24 hour  Intake 100 ml  Output 875 ml  Net -775 ml   Filed Weights   08/09/20 1536  Weight: 63.8 kg    Examination:   General: Appearance:    Elderly male in no acute distress     Lungs:     On [place] respirations unlabored  Heart:    Tachycardic. Normal rhythm. No murmurs, rubs, or gallops.   MS:   All extremities are intact.    Neurologic:   Awake, alert, oriented x 3. No apparent focal neurological            defect.       Data Reviewed: I have personally reviewed following labs and imaging studies  CBC: Recent Labs  Lab 08/09/20 0636 08/10/20 0330 08/11/20 0345  WBC 21.2* 14.5* 12.3*  NEUTROABS 16.5*  --  8.8*  HGB 11.1* 9.8* 9.5*  HCT 34.5* 30.7* 30.2*  MCV 81.9 83.0 83.0  PLT 355 309 824   Basic Metabolic Panel: Recent Labs  Lab 08/09/20 0636 08/10/20 0330 08/11/20 0345  NA 132* 137 133*  K 4.1 4.0 4.0  CL 98 103 99  CO2 21* 26 25  GLUCOSE 164* 130* 116*  BUN 10 12 13   CREATININE 1.09 1.02 1.11  CALCIUM 8.5* 8.7* 8.6*   GFR: Estimated Creatinine Clearance: 51.1 mL/min (by C-G formula based on SCr of 1.11 mg/dL). Liver Function Tests: Recent Labs  Lab 08/09/20 0636  AST 99*  ALT 65*  ALKPHOS 82  BILITOT 1.3*  PROT 6.9  ALBUMIN 2.6*   No results for input(s): LIPASE, AMYLASE in the last 168 hours. No results for input(s): AMMONIA in the last 168 hours. Coagulation Profile: No results for input(s): INR, PROTIME in the last 168 hours. Cardiac Enzymes: No results for input(s): CKTOTAL, CKMB, CKMBINDEX, TROPONINI in the last 168 hours. BNP (last 3 results) No results for input(s): PROBNP in the last 8760 hours. HbA1C: No results for input(s): HGBA1C in the last 72 hours. CBG: No results for input(s): GLUCAP in the last 168 hours. Lipid Profile: No results for input(s): CHOL, HDL, LDLCALC, TRIG, CHOLHDL, LDLDIRECT in the last 72 hours. Thyroid Function Tests: Recent Labs    08/09/20 1230  TSH 1.388   Anemia Panel: No results for input(s): VITAMINB12, FOLATE, FERRITIN, TIBC, IRON, RETICCTPCT in the last 72 hours. Sepsis Labs: Recent Labs  Lab 08/10/20 0753  PROCALCITON 0.33    Recent Results (from the past 240 hour(s))  Resp Panel by RT-PCR (Flu A&B, Covid) Nasopharyngeal Swab     Status: None   Collection Time: 08/09/20  6:51 AM   Specimen: Nasopharyngeal Swab; Nasopharyngeal(NP) swabs in vial transport medium  Result Value Ref Range Status    SARS Coronavirus 2 by RT PCR NEGATIVE NEGATIVE Final    Comment: (NOTE) SARS-CoV-2 target nucleic acids are NOT DETECTED.  The SARS-CoV-2 RNA is generally detectable in upper respiratory specimens during the acute phase of infection. The lowest concentration of SARS-CoV-2 viral copies this assay can detect is 138 copies/mL. A negative result does not preclude SARS-Cov-2 infection and should not be used as the sole basis for treatment or other patient management decisions. A negative result may occur with  improper specimen collection/handling, submission  of specimen other than nasopharyngeal swab, presence of viral mutation(s) within the areas targeted by this assay, and inadequate number of viral copies(<138 copies/mL). A negative result must be combined with clinical observations, patient history, and epidemiological information. The expected result is Negative.  Fact Sheet for Patients:  EntrepreneurPulse.com.au  Fact Sheet for Healthcare Providers:  IncredibleEmployment.be  This test is no t yet approved or cleared by the [place] FDA and  has been authorized for detection and/or diagnosis of SARS-CoV-2 by FDA under an Emergency Use Authorization (EUA). This EUA will remain  in effect (meaning this test can be used) for the duration of the COVID-19 declaration under Section 564(b)(1) of the Act, 21 U.S.C.section 360bbb-3(b)(1), unless the authorization is terminated  or revoked sooner.       Influenza A by PCR NEGATIVE NEGATIVE Final   Influenza B by PCR NEGATIVE NEGATIVE Final    Comment: (NOTE) The Xpert Xpress SARS-CoV-2/FLU/RSV plus assay is intended as an aid in the diagnosis of influenza from Nasopharyngeal swab specimens and should not be used as a sole basis for treatment. Nasal washings and aspirates are unacceptable for Xpert Xpress SARS-CoV-2/FLU/RSV testing.  Fact Sheet for  Patients: EntrepreneurPulse.com.au  Fact Sheet for Healthcare Providers: IncredibleEmployment.be  This test is not yet approved or cleared by the [place] FDA and has been authorized for detection and/or diagnosis of SARS-CoV-2 by FDA under an Emergency Use Authorization (EUA). This EUA will remain in effect (meaning this test can be used) for the duration of the COVID-19 declaration under Section 564(b)(1) of the Act, 21 U.S.C. section 360bbb-3(b)(1), unless the authorization is terminated or revoked.  Performed at [facility], [place] [address], [place], [place] [address]   Culture, blood (routine x 2) Call MD if unable to obtain prior to antibiotics being given     Status: None (Preliminary result)   Collection Time: 08/09/20 12:29 PM   Specimen: BLOOD  Result Value Ref Range Status   Specimen Description   Final    BLOOD BLOOD LEFT FOREARM Performed at [place] [address], [place], [place] [address]    Special Requests   Final    BOTTLES DRAWN AEROBIC AND ANAEROBIC Blood Culture results may not be optimal due to an inadequate volume of blood received in culture bottles Performed at [place] [address], [place], [place] [address]    Culture   Final    NO GROWTH 1 DAY Performed at [facility], [place] [address] [place], [place] [address]    Report Status PENDING  Incomplete  Culture, blood (routine x 2) Call MD if unable to obtain prior to antibiotics being given     Status: None (Preliminary result)   Collection Time: 08/09/20 12:30 PM   Specimen: BLOOD  Result Value Ref Range Status   Specimen Description   Final    BLOOD RIGHT ANTECUBITAL Performed at [place] [address] [place], [place] [address]    Special Requests   Final    BOTTLES DRAWN AEROBIC AND ANAEROBIC Blood Culture results may not be optimal due to an inadequate  volume of blood received in culture bottles Performed at [place] [address], [place], [place] [address]    Culture   Final    NO GROWTH 1 DAY Performed at [facility], [place] [address], [place],  [address]    Report Status PENDING  Incomplete  Expectorated  Sputum Assessment w Gram Stain, Rflx to Resp Cult     Status: None   Collection Time: 08/10/20  7:13 PM   Specimen: Expectorated Sputum  Result Value Ref Range Status   Specimen Description EXPECTORATED SPUTUM  Final   Special Requests NONE  Final   Sputum evaluation   Final    THIS SPECIMEN IS ACCEPTABLE FOR SPUTUM CULTURE Performed at [facility], [place] [address], [place], [place] [address]    Report Status 08/10/2020 FINAL  Final      Radiology Studies: ECHOCARDIOGRAM COMPLETE  Result Date: 08/09/2020    ECHOCARDIOGRAM REPORT   Patient Name:   MARJORIE LUSSIER Date of Exam: 08/09/2020 Medical Rec #:  [id number]                Height:       67.0 in Accession #:    [id number]               Weight:       140.7 lb Date of Birth:  1945/09/03                BSA:          1.741 m Patient Age:    67 years                 BP:           136/96 mmHg Patient Gender: M                        HR:           111 bpm. Exam Location:  Inpatient Procedure: 2D Echo, Cardiac Doppler and Color Doppler Indications:    Dyspnea  History:        Patient has no prior history of Echocardiogram examinations.  Sonographer:    Merrie Roof RDCS Referring Phys: [place]  1. Left ventricular ejection fraction, by estimation, is 55 to 60%. The left ventricle has normal function. The left ventricle has no regional wall motion abnormalities. Left ventricular diastolic parameters are consistent with Grade I diastolic dysfunction (impaired relaxation).  2. Right ventricular systolic function is normal. The right ventricular size is normal. There is normal pulmonary artery systolic  pressure.  3. The mitral valve is normal in structure. Trivial mitral valve regurgitation. No evidence of mitral stenosis.  4. The aortic valve is tricuspid. There is mild calcification of the aortic valve. There is mild thickening of the aortic valve. Aortic valve regurgitation is not visualized. Mild to moderate aortic valve sclerosis/calcification is present, without any evidence of aortic stenosis.  5. The inferior vena cava is normal in size with greater than 50% respiratory variability, suggesting right atrial pressure of 3 mmHg. Comparison(s): No prior Echocardiogram. FINDINGS  Left Ventricle: Left ventricular ejection fraction, by estimation, is 55 to 60%. The left ventricle has normal function. The left ventricle has no regional wall motion abnormalities. The left ventricular internal cavity size was normal in size. There is  borderline concentric left ventricular hypertrophy. Left ventricular diastolic parameters are consistent with Grade I diastolic dysfunction (impaired relaxation). Normal left ventricular filling pressure. Right Ventricle: The right ventricular size is normal. No increase in right ventricular wall thickness. Right ventricular systolic function is normal. There is normal pulmonary artery systolic pressure. The tricuspid regurgitant velocity is 2.80 m/s, and  with an assumed right atrial pressure of 3 mmHg, the estimated right ventricular systolic pressure  is 34.4 mmHg. Left Atrium: Left atrial size was normal in size. Right Atrium: Right atrial size was normal in size. Pericardium: There is no evidence of pericardial effusion. Mitral Valve: The mitral valve is normal in structure. There is mild thickening of the mitral valve leaflet(s). There is mild calcification of the mitral valve leaflet(s). Mild mitral annular calcification. Trivial mitral valve regurgitation. No evidence  of mitral valve stenosis. Tricuspid Valve: The tricuspid valve is normal in structure. Tricuspid valve  regurgitation is trivial. Aortic Valve: The aortic valve is tricuspid. There is mild calcification of the aortic valve. There is mild thickening of the aortic valve. Aortic valve regurgitation is not visualized. Mild to moderate aortic valve sclerosis/calcification is present, without any evidence of aortic stenosis. Pulmonic Valve: The pulmonic valve was normal in structure. Pulmonic valve regurgitation is trivial. Aorta: The aortic root and ascending aorta are structurally normal, with no evidence of dilitation. Venous: The inferior vena cava is normal in size with greater than 50% respiratory variability, suggesting right atrial pressure of 3 mmHg. IAS/Shunts: No atrial level shunt detected by color flow Doppler.  LEFT VENTRICLE PLAX 2D LVIDd:         2.97 cm  Diastology LVIDs:         2.18 cm  LV e' medial:  8.05 cm/s LV PW:         1.06 cm  LV e' lateral: 8.59 cm/s LV IVS:        0.87 cm LVOT diam:     1.90 cm LV SV:         66 LV SV Index:   38 LVOT Area:     2.84 cm  RIGHT VENTRICLE RV Basal diam:  3.60 cm LEFT ATRIUM             Index       RIGHT ATRIUM           Index LA diam:        2.80 cm 1.61 cm/m  RA Area:     16.40 cm LA Vol (A2C):   49.4 ml 28.37 ml/m RA Volume:   37.50 ml  21.54 ml/m LA Vol (A4C):   57.5 ml 33.02 ml/m LA Biplane Vol: 53.2 ml 30.55 ml/m  AORTIC VALVE LVOT Vmax:   154.00 cm/s LVOT Vmean:  113.000 cm/s LVOT VTI:    0.232 m  AORTA Ao Root diam: 3.10 cm Ao Asc diam:  3.50 cm TRICUSPID VALVE TR Peak grad:   31.4 mmHg TR Vmax:        280.00 cm/s  SHUNTS Systemic VTI:  0.23 m Systemic Diam: 1.90 cm Gwyndolyn Kaufman MD Electronically signed by Gwyndolyn Kaufman MD Signature Date/Time: 08/09/2020/5:00:32 PM    Final     Scheduled Meds:  amLODipine  10 mg Oral Daily   buPROPion  300 mg Oral Daily   chlorhexidine  15 mL Mouth Rinse BID   cholecalciferol  2,000 Units Oral Daily   enoxaparin (LOVENOX) injection  40 mg Subcutaneous Daily   famotidine  20 mg Oral Daily   feeding  supplement  237 mL Oral Daily   gabapentin  300 mg Oral TID   loratadine  10 mg Oral Daily   losartan  25 mg Oral Daily   mouth rinse  15 mL Mouth Rinse q12n4p   pantoprazole  40 mg Oral Daily   polyethylene glycol  17 g Oral Daily   pravastatin  80 mg Oral Daily   Continuous Infusions:  sodium  chloride 1,000 mL (08/11/20 0928)   azithromycin 500 mg (08/11/20 0930)   cefTRIAXone (ROCEPHIN)  IV 2 g (08/11/20 1136)     LOS: 1 day   Time spent: 36 minutes   Geradine Girt, DO [facility]  08/11/2020, 12:29 PM   How to contact the [facility] Attending or Consulting provider [place] or covering provider during after hours [place], for this patient?  Check the care team in [facility] and look for a) attending/consulting TR[facility] provider listed and b) the [facility] team listed. Page or secure chat [phone]. Log into [email or web address] and use [place]'s universal password to access. If you do not have the password, please contact the hospital operator. Locate the [facility] provider you are looking for under [facility] and page to a number that you can be directly reached. If you still have difficulty reaching the provider, please page the [facility] (Director on Call) for the Hospitalists listed on amion for assistance.

## 2020-08-11 NOTE — Progress Notes (Signed)
02 walk test attempted, unable to complete due to patient being 80% on 10L. Patient placed back in bed, 02 on 4L. Saturations back up to 91% at rest.

## 2020-08-12 ENCOUNTER — Inpatient Hospital Stay (HOSPITAL_COMMUNITY): Payer: No Typology Code available for payment source

## 2020-08-12 DIAGNOSIS — J9601 Acute respiratory failure with hypoxia: Secondary | ICD-10-CM | POA: Diagnosis not present

## 2020-08-12 LAB — LEGIONELLA PNEUMOPHILA SEROGP 1 UR AG: L. pneumophila Serogp 1 Ur Ag: NEGATIVE

## 2020-08-12 MED ORDER — LEVALBUTEROL HCL 0.63 MG/3ML IN NEBU
0.6300 mg | INHALATION_SOLUTION | Freq: Three times a day (TID) | RESPIRATORY_TRACT | Status: DC
  Administered 2020-08-12 – 2020-08-13 (×5): 0.63 mg via RESPIRATORY_TRACT
  Filled 2020-08-12 (×5): qty 3

## 2020-08-12 MED ORDER — MIDAZOLAM HCL 2 MG/2ML IJ SOLN
INTRAMUSCULAR | Status: AC | PRN
  Administered 2020-08-12: 1 mg via INTRAVENOUS

## 2020-08-12 MED ORDER — HYDROMORPHONE HCL 1 MG/ML IJ SOLN
0.5000 mg | INTRAMUSCULAR | Status: DC | PRN
  Administered 2020-08-13 – 2020-08-14 (×2): 0.5 mg via INTRAVENOUS
  Filled 2020-08-12 (×3): qty 0.5

## 2020-08-12 MED ORDER — LIDOCAINE HCL 1 % IJ SOLN
INTRAMUSCULAR | Status: AC
  Filled 2020-08-12: qty 20

## 2020-08-12 MED ORDER — GUAIFENESIN-DM 100-10 MG/5ML PO SYRP
5.0000 mL | ORAL_SOLUTION | ORAL | Status: DC | PRN
  Administered 2020-08-12 – 2020-08-22 (×25): 5 mL via ORAL
  Filled 2020-08-12 (×26): qty 10

## 2020-08-12 MED ORDER — MIDAZOLAM HCL 2 MG/2ML IJ SOLN
INTRAMUSCULAR | Status: AC
  Filled 2020-08-12: qty 4

## 2020-08-12 MED ORDER — FENTANYL CITRATE (PF) 100 MCG/2ML IJ SOLN
INTRAMUSCULAR | Status: AC | PRN
  Administered 2020-08-12: 50 ug via INTRAVENOUS

## 2020-08-12 MED ORDER — FENTANYL CITRATE (PF) 100 MCG/2ML IJ SOLN
INTRAMUSCULAR | Status: AC
  Filled 2020-08-12: qty 2

## 2020-08-12 NOTE — Procedures (Signed)
Interventional Radiology Procedure Note  Procedure: US Guided Biopsy of left neck supraclavicular lymph node  Complications: None  Estimated Blood Loss: < 10 mL  Findings: 23 G core biopsy of left supraclavicular LN performed under US guidance.  Four core samples obtained and sent to Pathology.  Venetia Night. Kathlene Cote, M.D Pager:  [phone]

## 2020-08-12 NOTE — Plan of Care (Signed)
  Problem: Activity: Goal: Risk for activity intolerance will decrease Outcome: Progressing   Problem: Nutrition: Goal: Adequate nutrition will be maintained Outcome: Progressing   Problem: Coping: Goal: Level of anxiety will decrease Outcome: Progressing   

## 2020-08-12 NOTE — Progress Notes (Signed)
Chief Complaint: Patient was seen in consultation today for LN biopsy at the request of Dr. Eliseo Squires  Referring Physician(s): Dr. Eulogio Bear  Supervising Physician: Aletta Edouard  Patient Status: [facility] - In-pt  History of Present Illness: Grant Taylor is a 74 y.o. male with pancreatic mass, numerous lung and liver nodules, as well as adenopathy highly concerning for metastatic process. He was being worked up at the [place] but had yet to have tissue sampling. He has been admitted for worsening SOB and IR is asked to perform biopsy while he is here. PMHx, meds, labs, imaging, allergies reviewed. Feels well, no recent fevers, chills, illness. Has been NPO today as directed. Family at bedside.   Past Medical History:  Diagnosis Date  . Cancer ([place])   . Chronic GERD   . Constipation   . ED (erectile dysfunction)   . History of BPH   . Hyperlipemia   . Hypertension   . Hypokalemia   . OAB (overactive bladder)   . Obstructive sleep apnea   . Positive PPD   . Vitamin B 12 deficiency     Past Surgical History:  Procedure Laterality Date  . LAMINECTOMY AND MICRODISCECTOMY LUMBAR SPINE  2007    Allergies: Levofloxacin, Lisinopril, Diclofenac, Dust mite extract, Elemental sulfur, Etodolac, No known allergies, Tolterodine, Citalopram, and Other  Medications:  Current Facility-Administered Medications:  .  0.9 %  sodium chloride infusion, , Intravenous, PRN, Geradine Girt, DO, Stopped at 08/11/20 1341 .  acetaminophen (TYLENOL) tablet 650 mg, 650 mg, Oral, Q6H PRN, 650 mg at 08/12/20 0944 **OR** acetaminophen (TYLENOL) suppository 650 mg, 650 mg, Rectal, Q6H PRN, Rise Patience, MD .  amLODipine (NORVASC) tablet 10 mg, 10 mg, Oral, Daily, Rise Patience, MD, 10 mg at 08/12/20 0945 .  azithromycin (ZITHROMAX) tablet 500 mg, 500 mg, Oral, Daily, Vann, Jessica U, DO, 500 mg at 08/12/20 0945 .  benzonatate (TESSALON) capsule 100 mg, 100 mg, Oral, TID PRN,  Darliss Cheney, MD, 100 mg at 08/12/20 0959 .  buPROPion (WELLBUTRIN XL) 24 hr tablet 300 mg, 300 mg, Oral, Daily, Rise Patience, MD, 300 mg at 08/12/20 0945 .  cefTRIAXone (ROCEPHIN) 2 g in sodium chloride 0.9 % 100 mL IVPB, 2 g, Intravenous, Q24H, Rise Patience, MD, Last Rate: 200 mL/hr at 08/12/20 1337, 2 g at 08/12/20 1337 .  chlorhexidine (PERIDEX) 0.12 % solution 15 mL, 15 mL, Mouth Rinse, BID, Vann, Jessica U, DO, 15 mL at 08/12/20 0948 .  cholecalciferol (VITAMIN D) tablet 2,000 Units, 2,000 Units, Oral, Daily, Darliss Cheney, MD, 2,000 Units at 08/12/20 0945 .  cyclobenzaprine (FLEXERIL) tablet 10 mg, 10 mg, Oral, TID PRN, Darliss Cheney, MD, 10 mg at 08/12/20 0945 .  docusate sodium (COLACE) capsule 100 mg, 100 mg, Oral, Daily PRN, Rise Patience, MD, 100 mg at 08/12/20 1022 .  enoxaparin (LOVENOX) injection 40 mg, 40 mg, Subcutaneous, Daily, Rise Patience, MD, 40 mg at 08/12/20 0951 .  famotidine (PEPCID) tablet 20 mg, 20 mg, Oral, Daily, Rise Patience, MD, 20 mg at 08/12/20 0946 .  feeding supplement (ENSURE ENLIVE / ENSURE PLUS) liquid 237 mL, 237 mL, Oral, Daily, Rise Patience, MD, 237 mL at 08/12/20 1002 .  gabapentin (NEURONTIN) capsule 300 mg, 300 mg, Oral, TID, Rise Patience, MD, 300 mg at 08/12/20 0945 .  guaiFENesin tablet 400 mg, 400 mg, Oral, Daily PRN, Darliss Cheney, MD, 400 mg at 08/10/20 1531 .  guaiFENesin-dextromethorphan (ROBITUSSIN DM) 100-10  MG/5ML syrup 5 mL, 5 mL, Oral, Q4H PRN, Vann, Jessica U, DO, 5 mL at 08/12/20 1023 .  HYDROmorphone (DILAUDID) injection 0.5 mg, 0.5 mg, Intravenous, Q4H PRN, Vann, Jessica U, DO .  hydrOXYzine (ATARAX/VISTARIL) tablet 10 mg, 10 mg, Oral, Daily PRN, Rise Patience, MD .  levalbuterol Penne Lash) nebulizer solution 0.63 mg, 0.63 mg, Nebulization, Q6H PRN, Rise Patience, MD, 0.63 mg at 08/12/20 0152 .  levalbuterol (XOPENEX) nebulizer solution 0.63 mg, 0.63 mg, Nebulization, TID,  Vann, Jessica U, DO, 0.63 mg at 08/12/20 1420 .  loratadine (CLARITIN) tablet 10 mg, 10 mg, Oral, Daily, Pahwani, Ravi, MD, 10 mg at 08/12/20 0946 .  losartan (COZAAR) tablet 25 mg, 25 mg, Oral, Daily, Rise Patience, MD, 25 mg at 08/12/20 0945 .  MEDLINE mouth rinse, 15 mL, Mouth Rinse, q12n4p, Vann, Jessica U, DO, 15 mL at 08/12/20 1333 .  oxyCODONE (Oxy IR/ROXICODONE) immediate release tablet 5 mg, 5 mg, Oral, Q4H PRN, Darliss Cheney, MD, 5 mg at 08/12/20 1131 .  pantoprazole (PROTONIX) EC tablet 40 mg, 40 mg, Oral, Daily, Rise Patience, MD, 40 mg at 08/12/20 0946 .  polyethylene glycol (MIRALAX / GLYCOLAX) packet 17 g, 17 g, Oral, Daily, Rise Patience, MD, 17 g at 08/12/20 0944 .  pravastatin (PRAVACHOL) tablet 80 mg, 80 mg, Oral, Daily, Rise Patience, MD, 80 mg at 08/12/20 0945 .  tiZANidine (ZANAFLEX) tablet 4 mg, 4 mg, Oral, TID PRN, Rise Patience, MD, 4 mg at 08/12/20 1330    History reviewed. No pertinent family history.  Social History   Socioeconomic History  . Marital status: Married    Spouse name: Not on file  . Number of children: Not on file  . Years of education: Not on file  . Highest education level: Not on file  Occupational History  . Not on file  Tobacco Use  . Smoking status: Former  . Smokeless tobacco: Never  Vaping Use  . Vaping Use: Former  Substance and Sexual Activity  . Alcohol use: Not Currently  . Drug use: Not Currently  . Sexual activity: Yes  Other Topics Concern  . Not on file  Social History Narrative  . Not on file   Social Determinants of Health   Financial Resource Strain: Not on file  Food Insecurity: Not on file  Transportation Needs: Not on file  Physical Activity: Not on file  Stress: Not on file  Social Connections: Not on file    Review of Systems: A 12 point ROS discussed and pertinent positives are indicated in the HPI above.  All other systems are negative.  Review of Systems  Vital  Signs: BP 117/81 (BP Location: Right Arm)   Pulse (!) 110   Temp 98.4 F (36.9 C) (Oral)   Resp 20   Ht 5\' 7"  (1.702 m)   Wt 63.8 kg   SpO2 91%   BMI 22.03 kg/m   Physical Exam Constitutional:      Appearance: He is well-developed.  HENT:     Mouth/Throat:     Mouth: Mucous membranes are moist.     Pharynx: Oropharynx is clear.  Neck:     Comments: Palpable firm, NT left neck LN Cardiovascular:     Rate and Rhythm: Normal rate and regular rhythm.     Heart sounds: Normal heart sounds.  Pulmonary:     Effort: Pulmonary effort is normal. No respiratory distress.     Breath sounds: Normal breath sounds.  Neurological:     General: No focal deficit present.     Mental Status: He is alert and oriented to person, place, and time.  Psychiatric:        Mood and Affect: Mood normal.        Thought Content: Thought content normal.        Judgment: Judgment normal.      Imaging: CT Angio Chest PE W/Cm &/Or Wo Cm  Result Date: 08/09/2020 CLINICAL DATA:  Shortness of breath.  Pancreatic mass. EXAM: CT ANGIOGRAPHY CHEST WITH CONTRAST TECHNIQUE: Multidetector CT imaging of the chest was performed using the standard protocol during bolus administration of intravenous contrast. Multiplanar CT image reconstructions and MIPs were obtained to evaluate the vascular anatomy. CONTRAST:  153mL OMNIPAQUE IOHEXOL 350 MG/ML SOLN COMPARISON:  None. FINDINGS: Cardiovascular: No filling defects in the pulmonary arteries to suggest pulmonary emboli. Heart is normal size. Aorta is normal caliber. Scattered aortic calcifications. Mediastinum/Nodes: Mediastinal and bilateral hilar adenopathy. No axillary adenopathy. Index prevascular lymph node has a short axis diameter of 2.7 cm. Lungs/Pleura: Extensive innumerable bilateral pulmonary nodules and masses. Index right lower lobe mass on image 111 measures 2.6 cm. Index left lower lobe mass on image 86 measures 2.5 cm. Small left pleural effusion. Upper  Abdomen: Probable peritoneal implants in the left upper lobe anterior to the spleen. See abdominal CT report for further discussion. Musculoskeletal: Chest wall soft tissues are unremarkable. No acute bony abnormality. Review of the MIP images confirms the above findings. IMPRESSION: No evidence of pulmonary embolus. Innumerable bilateral pulmonary nodules and masses compatible metastases. Bulky mediastinal and bilateral hilar adenopathy. Small left pleural effusion. Electronically Signed   By: Rolm Baptise M.D.   On: 08/09/2020 08:55   CT Abdomen Pelvis W Contrast  Result Date: 08/09/2020 CLINICAL DATA:  Metastatic disease evaluation.  Pancreatic tumor. EXAM: CT ABDOMEN AND PELVIS WITH CONTRAST TECHNIQUE: Multidetector CT imaging of the abdomen and pelvis was performed using the standard protocol following bolus administration of intravenous contrast. CONTRAST:  165mL OMNIPAQUE IOHEXOL 350 MG/ML SOLN COMPARISON:  Chest CT today.  CT abdomen and pelvis 12/27/2019 FINDINGS: Lower chest: Innumerable masses in the lung bases. Small left pleural effusion. Hepatobiliary: Scattered low-density lesions throughout the liver which do not appear to be cystic and are concerning for metastases. Gallbladder unremarkable. Pancreas: Large mass in the pancreatic tail measures up to approximately 10 cm with extension anterior to the spleen and possible involvement of the anterior upper pole of the left kidney. Spleen: No focal abnormality.  Normal size. Adrenals/Urinary Tract: Tumor from the pancreatic tail appears to extend to and possibly involve the anterior to mid pole of the left kidney. No hydronephrosis. Right adrenal gland unremarkable. Left adrenal gland not visualized and likely encased by the pancreatic tumor. Urinary bladder unremarkable. Stomach/Bowel: Pancreatic tumor abuts the proximal descending thoracic aorta at the splenic flexure. No evidence of bowel obstruction. Vascular/Lymphatic: Aortic atherosclerosis.  Large left periaortic lymph node with a short axis diameter of 3.3 cm. Other enlarged upper abdominal retroperitoneal lymph nodes. Reproductive: No visible focal abnormality. Other: Small amount of free fluid in the pelvis. Musculoskeletal: No acute bony abnormality. IMPRESSION: Large mass involving the pancreatic tail measuring up to 10 cm with extension in the left retroperitoneum with possible involvement of the left kidney. Mass extends anterior to the spleen. Retroperitoneal adenopathy. Low-density lesions in the liver, likely metastases. Innumerable masses in the pulmonary bases compatible with metastases. Small left pleural effusion. Small amount of free fluid in  the pelvis. Aortic atherosclerosis. Electronically Signed   By: Rolm Baptise M.D.   On: 08/09/2020 08:59   DG Chest Portable 1 View  Result Date: 08/09/2020 CLINICAL DATA:  75 year old male with shortness of breath. History of pancreatic tumor. EXAM: PORTABLE CHEST 1 VIEW COMPARISON:  No priors. FINDINGS: Widespread areas of interstitial prominence are noted throughout the lungs bilaterally, most evident in the mid to lower lungs, strongly suggestive of interstitial lung disease. Lung volumes are normal. In addition, there are extensive areas of nodularity throughout the lungs bilaterally also most evident throughout the mid to lower lungs, concerning for widespread metastatic disease. Small left pleural effusion. No definite right pleural effusion. No pneumothorax. No evidence of pulmonary edema. Heart size is normal. Upper mediastinal contours are within normal limits. Orthopedic fixation hardware in the lower cervical spine incompletely imaged. IMPRESSION: 1. The appearance the chest suggest widespread metastatic disease to the lungs, superimposed upon a background of chronic interstitial lung disease, as above. 2. Small left pleural effusion. Electronically Signed   By: Vinnie Langton M.D.   On: 08/09/2020 07:16   ECHOCARDIOGRAM  COMPLETE  Result Date: 08/09/2020    ECHOCARDIOGRAM REPORT   Patient Name:   Grant Taylor Date of Exam: 08/09/2020 Medical Rec #:  [id number]                Height:       67.0 in Accession #:    [id number]               Weight:       140.7 lb Date of Birth:  January 18, 1946                BSA:          1.741 m Patient Age:    49 years                 BP:           136/96 mmHg Patient Gender: M                        HR:           111 bpm. Exam Location:  Inpatient Procedure: 2D Echo, Cardiac Doppler and Color Doppler Indications:    Dyspnea  History:        Patient has no prior history of Echocardiogram examinations.  Sonographer:    Merrie Roof RDCS Referring Phys: [place]  1. Left ventricular ejection fraction, by estimation, is 55 to 60%. The left ventricle has normal function. The left ventricle has no regional wall motion abnormalities. Left ventricular diastolic parameters are consistent with Grade I diastolic dysfunction (impaired relaxation).  2. Right ventricular systolic function is normal. The right ventricular size is normal. There is normal pulmonary artery systolic pressure.  3. The mitral valve is normal in structure. Trivial mitral valve regurgitation. No evidence of mitral stenosis.  4. The aortic valve is tricuspid. There is mild calcification of the aortic valve. There is mild thickening of the aortic valve. Aortic valve regurgitation is not visualized. Mild to moderate aortic valve sclerosis/calcification is present, without any evidence of aortic stenosis.  5. The inferior vena cava is normal in size with greater than 50% respiratory variability, suggesting right atrial pressure of 3 mmHg. Comparison(s): No prior Echocardiogram. FINDINGS  Left Ventricle: Left ventricular ejection fraction, by estimation, is 55 to 60%. The left ventricle has normal function. The left ventricle  has no regional wall motion abnormalities. The left ventricular internal cavity size  was normal in size. There is  borderline concentric left ventricular hypertrophy. Left ventricular diastolic parameters are consistent with Grade I diastolic dysfunction (impaired relaxation). Normal left ventricular filling pressure. Right Ventricle: The right ventricular size is normal. No increase in right ventricular wall thickness. Right ventricular systolic function is normal. There is normal pulmonary artery systolic pressure. The tricuspid regurgitant velocity is 2.80 m/s, and  with an assumed right atrial pressure of 3 mmHg, the estimated right ventricular systolic pressure is 16.0 mmHg. Left Atrium: Left atrial size was normal in size. Right Atrium: Right atrial size was normal in size. Pericardium: There is no evidence of pericardial effusion. Mitral Valve: The mitral valve is normal in structure. There is mild thickening of the mitral valve leaflet(s). There is mild calcification of the mitral valve leaflet(s). Mild mitral annular calcification. Trivial mitral valve regurgitation. No evidence  of mitral valve stenosis. Tricuspid Valve: The tricuspid valve is normal in structure. Tricuspid valve regurgitation is trivial. Aortic Valve: The aortic valve is tricuspid. There is mild calcification of the aortic valve. There is mild thickening of the aortic valve. Aortic valve regurgitation is not visualized. Mild to moderate aortic valve sclerosis/calcification is present, without any evidence of aortic stenosis. Pulmonic Valve: The pulmonic valve was normal in structure. Pulmonic valve regurgitation is trivial. Aorta: The aortic root and ascending aorta are structurally normal, with no evidence of dilitation. Venous: The inferior vena cava is normal in size with greater than 50% respiratory variability, suggesting right atrial pressure of 3 mmHg. IAS/Shunts: No atrial level shunt detected by color flow Doppler.  LEFT VENTRICLE PLAX 2D LVIDd:         2.97 cm  Diastology LVIDs:         2.18 cm  LV e' medial:   8.05 cm/s LV PW:         1.06 cm  LV e' lateral: 8.59 cm/s LV IVS:        0.87 cm LVOT diam:     1.90 cm LV SV:         66 LV SV Index:   38 LVOT Area:     2.84 cm  RIGHT VENTRICLE RV Basal diam:  3.60 cm LEFT ATRIUM             Index       RIGHT ATRIUM           Index LA diam:        2.80 cm 1.61 cm/m  RA Area:     16.40 cm LA Vol (A2C):   49.4 ml 28.37 ml/m RA Volume:   37.50 ml  21.54 ml/m LA Vol (A4C):   57.5 ml 33.02 ml/m LA Biplane Vol: 53.2 ml 30.55 ml/m  AORTIC VALVE LVOT Vmax:   154.00 cm/s LVOT Vmean:  113.000 cm/s LVOT VTI:    0.232 m  AORTA Ao Root diam: 3.10 cm Ao Asc diam:  3.50 cm TRICUSPID VALVE TR Peak grad:   31.4 mmHg TR Vmax:        280.00 cm/s  SHUNTS Systemic VTI:  0.23 m Systemic Diam: 1.90 cm Gwyndolyn Kaufman MD Electronically signed by Gwyndolyn Kaufman MD Signature Date/Time: 08/09/2020/5:00:32 PM    Final     Labs:  CBC: Recent Labs    12/27/19 1150 08/09/20 0636 08/10/20 0330 08/11/20 0345  WBC 7.3 21.2* 14.5* 12.3*  HGB 14.2 11.1* 9.8* 9.5*  HCT 42.8 34.5*  30.7* 30.2*  PLT 192 355 309 339    COAGS: No results for input(s): INR, APTT in the last 8760 hours.  BMP: Recent Labs    12/27/19 1150 08/09/20 0636 08/10/20 0330 08/11/20 0345  NA 142 132* 137 133*  K 3.8 4.1 4.0 4.0  CL 107 98 103 99  CO2 24 21* 26 25  GLUCOSE 93 164* 130* 116*  BUN 8 10 12 13   CALCIUM 9.5 8.5* 8.7* 8.6*  CREATININE 1.18 1.09 1.02 1.11  GFRNONAA >60 >60 >60 >60    LIVER FUNCTION TESTS: Recent Labs    12/27/19 1150 08/09/20 0636  BILITOT 0.7 1.3*  AST 17 99*  ALT 14 65*  ALKPHOS 64 82  PROT 7.0 6.9  ALBUMIN 3.6 2.6*    TUMOR MARKERS: No results for input(s): AFPTM, CEA, CA199, CHROMGRNA in the last 8760 hours.  Assessment and Plan: Left neck/supraclavicular adenopathy Pancreatic mass with lung, liver lesions, and adenopathy For US guided bx Risks and benefits of LN bx was discussed with the patient and/or patient's family including, but not limited to  bleeding, infection, damage to adjacent structures or low yield requiring additional tests.  All of the questions were answered and there is agreement to proceed.  Consent signed and in chart.   Thank you for this interesting consult.  I greatly enjoyed meeting Larren Copes and look forward to participating in their care.  A copy of this report was sent to the requesting provider on this date.  Electronically Signed: Ascencion Dike, PA-C 08/12/2020, 2:08 PM   I spent a total of 20 minutes in face to face in clinical consultation, greater than 50% of which was counseling/coordinating care for LN bx

## 2020-08-12 NOTE — Evaluation (Signed)
Physical Therapy Evaluation Patient Details Name: Grant Taylor MRN: [id number] DOB: 1945-02-20 Today's Date: 08/12/2020        History of Present Illness  75 yo male admitted with acute respiratory failure, sepsis. Hx of metastatic pancreatic cancer, COPD  Clinical Impression  On eval, pt was Min guard assist for mobility. He walked ~75 feet with 1 Lofstrand crutch. Mildly unsteady at times but no overt LOB. Dyspnea 2-3/4.   Patient Saturations on 4 / 6 Liters of oxygen while Ambulating = 88% / 93%    Follow Up Recommendations Home health PT;Supervision - Intermittent    Equipment Recommendations  None recommended by PT    Recommendations for Other Services       Precautions / Restrictions Precautions Precautions: Fall Precaution Comments: prefers shoes; monitor O2 Restrictions Weight Bearing Restrictions: No      Mobility  Bed Mobility Overal bed mobility: Modified Independent                  Transfers Overall transfer level: Needs assistance Equipment used: Lofstrands Transfers: Sit to/from Stand Sit to Stand: Min guard         General transfer comment: Uses 1 Lofstrand crutch  Ambulation/Gait Ambulation/Gait assistance: Min guard Gait Distance (Feet): 75 Feet Assistive device: Lofstrands Gait Pattern/deviations: Decreased dorsiflexion - left;Step-through pattern     General Gait Details: Uses 1 Lofstrand crutch. Mildly unsteady at times but no overt LOB. O2: 88% on 4L, 93% on 6L. Dyspnea 2/4  Stairs            Wheelchair Mobility    Modified Rankin (Stroke Patients Only)       Balance Overall balance assessment: Needs assistance         Standing balance support: Single extremity supported Standing balance-Leahy Scale: Fair                               Pertinent Vitals/Pain Pain Assessment: 0-10 Pain Score: 8  Pain Location: flank Pain Descriptors / Indicators: Discomfort;Sore Pain  Intervention(s): Monitored during session    Home Living Family/patient expects to be discharged to:: Private residence Living Arrangements: Alone   Type of Home: Apartment Home Access: Level entry     Home Layout: One level Home Equipment:  (R forearm crutch)      Prior Function Level of Independence: Independent with assistive device(s)         Comments: uses hurry cane inside home. forearm crutch outside of home     Hand Dominance        Extremity/Trunk Assessment   Upper Extremity Assessment Upper Extremity Assessment: Overall WFL for tasks assessed    Lower Extremity Assessment Lower Extremity Assessment: Generalized weakness (L foot DF weakness)    Cervical / Trunk Assessment Cervical / Trunk Assessment: Normal  Communication   Communication: No difficulties  Cognition Arousal/Alertness: Awake/alert Behavior During Therapy: WFL for tasks assessed/performed Overall Cognitive Status: Within Functional Limits for tasks assessed                                        General Comments      Exercises     Assessment/Plan    PT Assessment Patient needs continued PT services  PT Problem List Decreased strength;Decreased mobility;Decreased activity tolerance;Decreased balance;Decreased knowledge of use of DME       PT  Treatment Interventions Gait training;Therapeutic exercise;Balance training;Functional mobility training;Therapeutic activities;Patient/family education    PT Goals (Current goals can be found in the Care Plan section)  Acute Rehab PT Goals Patient Stated Goal: home soon PT Goal Formulation: With patient Time For Goal Achievement: 08/26/20 Potential to Achieve Goals: Good    Frequency Min 3X/week   Barriers to discharge        Co-evaluation               AM-PAC PT "6 Clicks" Mobility  Outcome Measure Help needed turning from your back to your side while in a flat bed without using bedrails?: None Help  needed moving from lying on your back to sitting on the side of a flat bed without using bedrails?: None Help needed moving to and from a bed to a chair (including a wheelchair)?: A Little Help needed standing up from a chair using your arms (e.g., wheelchair or bedside chair)?: A Little Help needed to walk in hospital room?: A Little Help needed climbing 3-5 steps with a railing? : A Lot 6 Click Score: 19    End of Session Equipment Utilized During Treatment: Gait belt;Oxygen Activity Tolerance: Patient tolerated treatment well Patient left: in chair;with call bell/phone within reach   PT Visit Diagnosis: Difficulty in walking, not elsewhere classified (R26.2);Muscle weakness (generalized) (M62.81);Unsteadiness on feet (R26.81)    Time: 1224-4975 PT Time Calculation (min) (ACUTE ONLY): 24 min   Charges:   PT Evaluation $PT Eval Moderate Complexity: 1 Mod PT Treatments $Gait Training: 8-22 mins           Doreatha Massed, PT Acute Rehabilitation  Office: [phone] Pager: [phone]

## 2020-08-12 NOTE — Progress Notes (Signed)
PROGRESS NOTE    Grant Taylor  FAO:[id number] DOB: 1945-09-12 DOA: 08/09/2020 PCP: [name], MD   Brief Narrative:  HPI: Grant Taylor is a 75 y.o. male with history of hypertension and COPD, hyperlipidemia presents to the ER after patient became acutely short of breath since last night.  Patient states he was recently diagnosed with pancreatic mass on July 26, 2020 and has follow-up at [facility] next week for further work-up.  Last night around 11 PM patient started having paroxysms of cough which was nonproductive with shortness of breath which persisted and patient decided to come to the ER.  Denies any fever chills or chest pain.  Has had some sweating spells.   Assessment & Plan:   Principal Problem:   Acute respiratory failure with hypoxia (HCC) Active Problems:   Essential hypertension   COPD (chronic obstructive pulmonary disease) (HCC)   Pancreatic mass   Severe sepsis (HCC)  Severe sepsis/acute hypoxic respiratory failure secondary to community-acquired pneumonia and lung metastasis: CT angiogram ruled out PE, and also did not show clear-cut infiltrates however it is hard to see infiltrates with lung metastasis, procalcitonin 0.33 and temperature of 100.4 this morning.  Per Dr. Doristine Bosworth: he does have pneumonia.  He also meets criteria for severe sepsis based on tachycardia, tachypnea and acute hypoxic respiratory failure.  We will continue Rocephin and Zithromax.  We will check sputum culture, urine Legionella and streptococci.  -home O2 study ordered, may need to be d/c'd on O2  Newly diagnosed pancreatic cancer with lung metastasis/bilateral flank pain:  - bilateral flank pain is likely due to pulmonary metastasis and large mass at the tail of the pancreas which extends to the retroperitoneum and possibly to the left kidney as mentioned in CT abdomen pelvis..   -continue home oxy with prn IV meds - was able to get progress note and speak with his  oncologist at the [facility]. Please see below:  - will see if we can get IR to biopsy lymph node -also will need CT of abd/pelvis and pathology information sent to the [facility] -may need palliative care consult  Essential hypertension: Controlled.  Continue amlodipine and losartan.  Hyperlipidemia: Continue statin.  History of COPD: Not in acute exacerbation.  Continue bronchodilators/Xopenex.  DVT prophylaxis: enoxaparin (LOVENOX) injection 40 mg Start: 08/09/20 1200   Code Status: Full Code  Spoke with daughter  Dispo: The patient is from: Home              Anticipated d/c is to: Home              Patient currently is not medically stable to d/c.- will need O2   Difficult to place patient No      Consultants:  None   Antimicrobials:  Anti-infectives (From admission, onward)    Start     Dose/Rate Route Frequency Ordered Stop   08/12/20 1000  azithromycin (ZITHROMAX) tablet 500 mg        500 mg Oral Daily 08/11/20 1345 08/14/20 0959   08/09/20 1200  cefTRIAXone (ROCEPHIN) 2 g in sodium chloride 0.9 % 100 mL IVPB        2 g 200 mL/hr over 30 Minutes Intravenous Every 24 hours 08/09/20 1142 08/14/20 1159   08/09/20 1200  azithromycin (ZITHROMAX) 500 mg in sodium chloride 0.9 % 250 mL IVPB  Status:  Discontinued        500 mg 250 mL/hr over 60 Minutes Intravenous Daily 08/09/20 1142 08/11/20 1345  Subjective: Continues with flank pain  Objective: Vitals:   08/12/20 0201 08/12/20 0644 08/12/20 0837 08/12/20 0957  BP: 140/88 (!) 148/92  (!) 143/95  Pulse: (!) 113 (!) 110  (!) 104  Resp: 20 20  18   Temp: 98 F (36.7 C) 99.3 F (37.4 C)  98.6 F (37 C)  TempSrc: Oral Oral  Oral  SpO2: 94% 95% 91% 94%  Weight:      Height:        Intake/Output Summary (Last 24 hours) at 08/12/2020 1308 Last data filed at 08/12/2020 1004 Gross per 24 hour  Intake 616.95 ml  Output 2825 ml  Net -2208.05 ml   Filed Weights   08/09/20 1536  Weight: 63.8 kg     Examination:   General: Appearance:    Ill appearing male in no acute distress     Lungs:     respirations unlabored, on O2, diminished right base  Heart:    Tachycardic.   MS:   All extremities are intact.    Neurologic:   Awake, alert, oriented x 3. No apparent focal neurological           defect.         Data Reviewed: I have personally reviewed following labs and imaging studies  CBC: Recent Labs  Lab 08/09/20 0636 08/10/20 0330 08/11/20 0345  WBC 21.2* 14.5* 12.3*  NEUTROABS 16.5*  --  8.8*  HGB 11.1* 9.8* 9.5*  HCT 34.5* 30.7* 30.2*  MCV 81.9 83.0 83.0  PLT 355 309 096   Basic Metabolic Panel: Recent Labs  Lab 08/09/20 0636 08/10/20 0330 08/11/20 0345  NA 132* 137 133*  K 4.1 4.0 4.0  CL 98 103 99  CO2 21* 26 25  GLUCOSE 164* 130* 116*  BUN 10 12 13   CREATININE 1.09 1.02 1.11  CALCIUM 8.5* 8.7* 8.6*   GFR: Estimated Creatinine Clearance: 51.1 mL/min (by C-G formula based on SCr of 1.11 mg/dL). Liver Function Tests: Recent Labs  Lab 08/09/20 0636  AST 99*  ALT 65*  ALKPHOS 82  BILITOT 1.3*  PROT 6.9  ALBUMIN 2.6*   No results for input(s): LIPASE, AMYLASE in the last 168 hours. No results for input(s): AMMONIA in the last 168 hours. Coagulation Profile: No results for input(s): INR, PROTIME in the last 168 hours. Cardiac Enzymes: No results for input(s): CKTOTAL, CKMB, CKMBINDEX, TROPONINI in the last 168 hours. BNP (last 3 results) No results for input(s): PROBNP in the last 8760 hours. HbA1C: No results for input(s): HGBA1C in the last 72 hours. CBG: No results for input(s): GLUCAP in the last 168 hours. Lipid Profile: No results for input(s): CHOL, HDL, LDLCALC, TRIG, CHOLHDL, LDLDIRECT in the last 72 hours. Thyroid Function Tests: No results for input(s): TSH, T4TOTAL, FREET4, T3FREE, THYROIDAB in the last 72 hours.  Anemia Panel: No results for input(s): VITAMINB12, FOLATE, FERRITIN, TIBC, IRON, RETICCTPCT in the last 72  hours. Sepsis Labs: Recent Labs  Lab 08/10/20 0753  PROCALCITON 0.33    Recent Results (from the past 240 hour(s))  Resp Panel by RT-PCR (Flu A&B, Covid) Nasopharyngeal Swab     Status: None   Collection Time: 08/09/20  6:51 AM   Specimen: Nasopharyngeal Swab; Nasopharyngeal(NP) swabs in vial transport medium  Result Value Ref Range Status   SARS Coronavirus 2 by RT PCR NEGATIVE NEGATIVE Final    Comment: (NOTE) SARS-CoV-2 target nucleic acids are NOT DETECTED.  The SARS-CoV-2 RNA is generally detectable in upper respiratory specimens during  the acute phase of infection. The lowest concentration of SARS-CoV-2 viral copies this assay can detect is 138 copies/mL. A negative result does not preclude SARS-Cov-2 infection and should not be used as the sole basis for treatment or other patient management decisions. A negative result may occur with  improper specimen collection/handling, submission of specimen other than nasopharyngeal swab, presence of viral mutation(s) within the areas targeted by this assay, and inadequate number of viral copies(<138 copies/mL). A negative result must be combined with clinical observations, patient history, and epidemiological information. The expected result is Negative.  Fact Sheet for Patients:  EntrepreneurPulse.com.au  Fact Sheet for Healthcare Providers:  IncredibleEmployment.be  This test is no t yet approved or cleared by the [place] FDA and  has been authorized for detection and/or diagnosis of SARS-CoV-2 by FDA under an Emergency Use Authorization (EUA). This EUA will remain  in effect (meaning this test can be used) for the duration of the COVID-19 declaration under Section 564(b)(1) of the Act, 21 U.S.C.section 360bbb-3(b)(1), unless the authorization is terminated  or revoked sooner.       Influenza A by PCR NEGATIVE NEGATIVE Final   Influenza B by PCR NEGATIVE NEGATIVE Final     Comment: (NOTE) The Xpert Xpress SARS-CoV-2/FLU/RSV plus assay is intended as an aid in the diagnosis of influenza from Nasopharyngeal swab specimens and should not be used as a sole basis for treatment. Nasal washings and aspirates are unacceptable for Xpert Xpress SARS-CoV-2/FLU/RSV testing.  Fact Sheet for Patients: EntrepreneurPulse.com.au  Fact Sheet for Healthcare Providers: IncredibleEmployment.be  This test is not yet approved or cleared by the [place] FDA and has been authorized for detection and/or diagnosis of SARS-CoV-2 by FDA under an Emergency Use Authorization (EUA). This EUA will remain in effect (meaning this test can be used) for the duration of the COVID-19 declaration under Section 564(b)(1) of the Act, 21 U.S.C. section 360bbb-3(b)(1), unless the authorization is terminated or revoked.  Performed at [facility], [place] [address] [place], [place] [address]   Culture, blood (routine x 2) Call MD if unable to obtain prior to antibiotics being given     Status: None (Preliminary result)   Collection Time: 08/09/20 12:29 PM   Specimen: BLOOD  Result Value Ref Range Status   Specimen Description   Final    BLOOD BLOOD LEFT FOREARM Performed at [place] [address] [place], [place] [address]    Special Requests   Final    BOTTLES DRAWN AEROBIC AND ANAEROBIC Blood Culture results may not be optimal due to an inadequate volume of blood received in culture bottles Performed at [place] [address], [place], [place] [address]    Culture   Final    NO GROWTH 3 DAYS Performed at [facility], [place] [address] [place], [place] [address]    Report Status PENDING  Incomplete  Culture, blood (routine x 2) Call MD if unable to obtain prior to antibiotics being given     Status: None (Preliminary result)   Collection Time: 08/09/20 12:30 PM   Specimen:  BLOOD  Result Value Ref Range Status   Specimen Description   Final    BLOOD RIGHT ANTECUBITAL Performed at [place] [address] [place], [place] [address]    Special Requests   Final    BOTTLES DRAWN AEROBIC AND ANAEROBIC Blood Culture results may not be optimal due to an inadequate volume of blood  received in culture bottles Performed at [facility], [place] [address], [place], [place] [address]    Culture   Final    NO GROWTH 3 DAYS Performed at [facility], [place] [address], [place], [place] [address]    Report Status PENDING  Incomplete  Expectorated Sputum Assessment w Gram Stain, Rflx to Resp Cult     Status: None   Collection Time: 08/10/20  7:13 PM   Specimen: Expectorated Sputum  Result Value Ref Range Status   Specimen Description EXPECTORATED SPUTUM  Final   Special Requests NONE  Final   Sputum evaluation   Final    THIS SPECIMEN IS ACCEPTABLE FOR SPUTUM CULTURE Performed at [facility], [place] [address] [place], [place] [address]    Report Status 08/10/2020 FINAL  Final  Culture, Respiratory w Gram Stain     Status: None (Preliminary result)   Collection Time: 08/10/20  7:13 PM  Result Value Ref Range Status   Specimen Description   Final    EXPECTORATED SPUTUM Performed at [place] [address] [place], [place] [address]    Special Requests   Final    NONE Reflexed from [id number] Performed at [facility], [place] [address], [place], [place] [address]    Gram Stain   Final    RARE WBC PRESENT, PREDOMINANTLY PMN NO ORGANISMS SEEN    Culture   Final    NO GROWTH 1 DAY Performed at [place] [address], [place],  [address]    Report Status PENDING  Incomplete      Radiology Studies: No results found.  Scheduled Meds:  amLODipine  10 mg Oral Daily   azithromycin  500 mg Oral Daily   buPROPion  300 mg Oral Daily    chlorhexidine  15 mL Mouth Rinse BID   cholecalciferol  2,000 Units Oral Daily   enoxaparin (LOVENOX) injection  40 mg Subcutaneous Daily   famotidine  20 mg Oral Daily   feeding supplement  237 mL Oral Daily   gabapentin  300 mg Oral TID   levalbuterol  0.63 mg Nebulization TID   loratadine  10 mg Oral Daily   losartan  25 mg Oral Daily   mouth rinse  15 mL Mouth Rinse q12n4p   pantoprazole  40 mg Oral Daily   polyethylene glycol  17 g Oral Daily   pravastatin  80 mg Oral Daily   Continuous Infusions:  sodium chloride Stopped (08/11/20 1341)   cefTRIAXone (ROCEPHIN)  IV Stopped (08/11/20 1207)     LOS: 2 days   Time spent: 36 minutes   Geradine Girt, DO [facility]  08/12/2020, 1:08 PM   How to contact the [facility] Attending or Consulting provider [place] or covering provider during after hours [place], for this patient?  Check the care team in [facility] and look for a) attending/consulting [facility] provider listed and b) the [facility] team listed. Page or secure chat [phone]. Log into [email or web address] and use [place]'s universal password to access. If you do not have the password, please contact the hospital operator. Locate the [facility] provider you are looking for under [facility] and page to a number that you can be directly reached. If you still have difficulty reaching the provider, please page the [facility] (Director on Call) for the Hospitalists listed on amion for assistance.

## 2020-08-13 ENCOUNTER — Inpatient Hospital Stay (HOSPITAL_COMMUNITY): Payer: No Typology Code available for payment source

## 2020-08-13 DIAGNOSIS — J9601 Acute respiratory failure with hypoxia: Secondary | ICD-10-CM | POA: Diagnosis not present

## 2020-08-13 LAB — CBC
HCT: 28.8 % — ABNORMAL LOW (ref 39.0–52.0)
Hemoglobin: 9.2 g/dL — ABNORMAL LOW (ref 13.0–17.0)
MCH: 26.5 pg (ref 26.0–34.0)
MCHC: 31.9 g/dL (ref 30.0–36.0)
MCV: 83 fL (ref 80.0–100.0)
Platelets: 354 10*3/uL (ref 150–400)
RBC: 3.47 MIL/uL — ABNORMAL LOW (ref 4.22–5.81)
RDW: 16.3 % — ABNORMAL HIGH (ref 11.5–15.5)
WBC: 10.9 10*3/uL — ABNORMAL HIGH (ref 4.0–10.5)
nRBC: 0 % (ref 0.0–0.2)

## 2020-08-13 LAB — BASIC METABOLIC PANEL
Anion gap: 11 (ref 5–15)
BUN: 11 mg/dL (ref 8–23)
CO2: 25 mmol/L (ref 22–32)
Calcium: 8.6 mg/dL — ABNORMAL LOW (ref 8.9–10.3)
Chloride: 101 mmol/L (ref 98–111)
Creatinine, Ser: 0.95 mg/dL (ref 0.61–1.24)
GFR, Estimated: >60 mL/min (ref >60)
Glucose, Bld: 103 mg/dL — ABNORMAL HIGH (ref 70–99)
Potassium: 3.7 mmol/L (ref 3.5–5.1)
Sodium: 137 mmol/L (ref 135–145)

## 2020-08-13 LAB — CULTURE, RESPIRATORY W GRAM STAIN: Culture: NORMAL

## 2020-08-13 MED ORDER — LEVALBUTEROL HCL 0.63 MG/3ML IN NEBU
0.6300 mg | INHALATION_SOLUTION | RESPIRATORY_TRACT | Status: DC
  Administered 2020-08-13: 0.63 mg via RESPIRATORY_TRACT
  Filled 2020-08-13: qty 3

## 2020-08-13 MED ORDER — LEVALBUTEROL HCL 0.63 MG/3ML IN NEBU
0.6300 mg | INHALATION_SOLUTION | Freq: Four times a day (QID) | RESPIRATORY_TRACT | Status: DC
  Administered 2020-08-14 (×3): 0.63 mg via RESPIRATORY_TRACT
  Filled 2020-08-13 (×3): qty 3

## 2020-08-13 NOTE — Progress Notes (Signed)
PROGRESS NOTE    Grant Taylor  HQI:[id number] DOB: April 01, 1945 DOA: 08/09/2020 PCP: [name], MD   Brief Narrative:  HPI: Grant Taylor is a 75 y.o. male with history of hypertension and COPD, hyperlipidemia presents to the ER after patient became acutely short of breath since last night.  Patient states he was recently diagnosed with pancreatic mass on July 26, 2020 and has follow-up at [facility] next week for further work-up.  Last night around 11 PM patient started having paroxysms of cough which was nonproductive with shortness of breath which persisted and patient decided to come to the ER.  Denies any fever chills or chest pain.  Has had some sweating spells.    Assessment & Plan:   Principal Problem:   Acute respiratory failure with hypoxia (HCC) Active Problems:   Essential hypertension   COPD (chronic obstructive pulmonary disease) (HCC)   Pancreatic mass   Severe sepsis (HCC)   Severe sepsis/acute hypoxic respiratory failure secondary to community-acquired pneumonia and lung metastasis: CT angiogram ruled out PE - procalcitonin 0.33 and temperature of 100.4,  Per Dr. Doristine Bosworth: he does have pneumonia.  He also meets criteria for severe sepsis based on tachycardia, tachypnea and acute hypoxic respiratory failure.   -continue Rocephin and Zithromax.   -culture negative -strep and legionella negative -home O2 study ordered, may need to be d/c'd on O2 -xray ordered and pending 7/19  Newly diagnosed pancreatic cancer with lung metastasis/bilateral flank pain:  - bilateral flank pain is likely due to pulmonary metastasis and large mass at the tail of the pancreas which extends to the retroperitoneum and possibly to the left kidney as mentioned in CT abdomen pelvis..   -continue home oxy with prn IV meds - was able to get progress note and speak with his oncologist at the [facility]. Please see below:  -  IR biopsy done on 7/18 -also will need CT of abd/pelvis and  pathology information sent to the [facility] -may need palliative care consult  Essential hypertension: Controlled.  Continue amlodipine and losartan.  Hyperlipidemia: Continue statin.  History of COPD: Not in acute exacerbation.  Continue bronchodilators/Xopenex.  DVT prophylaxis: enoxaparin (LOVENOX) injection 40 mg Start: 08/09/20 1200   Code Status: Full Code  Spoke with daughter  Dispo: The patient is from: Home              Anticipated d/c is to: Home              Patient currently is not medically stable to d/c.- will need O2   Difficult to place patient No      Consultants:  IR   Antimicrobials:  Anti-infectives (From admission, onward)    Start     Dose/Rate Route Frequency Ordered Stop   08/12/20 1000  azithromycin (ZITHROMAX) tablet 500 mg        500 mg Oral Daily 08/11/20 1345 08/13/20 0854   08/09/20 1200  cefTRIAXone (ROCEPHIN) 2 g in sodium chloride 0.9 % 100 mL IVPB        2 g 200 mL/hr over 30 Minutes Intravenous Every 24 hours 08/09/20 1142 08/14/20 1159   08/09/20 1200  azithromycin (ZITHROMAX) 500 mg in sodium chloride 0.9 % 250 mL IVPB  Status:  Discontinued        500 mg 250 mL/hr over 60 Minutes Intravenous Daily 08/09/20 1142 08/11/20 1345          Subjective: Feels like breathing is worse  Objective: Vitals:   08/13/20 0300  08/13/20 0330 08/13/20 0500 08/13/20 0828  BP:   (!) 141/95   Pulse:   (!) 106   Resp:   20   Temp:   98.4 F (36.9 C)   TempSrc:   Oral   SpO2: (!) 86% 91% 94% 91%  Weight:      Height:        Intake/Output Summary (Last 24 hours) at 08/13/2020 1129 Last data filed at 08/13/2020 2458 Gross per 24 hour  Intake 340 ml  Output 950 ml  Net -610 ml   Filed Weights   08/09/20 1536  Weight: 63.8 kg    Examination:   General: Appearance:    Well developed, well nourished male in no acute distress     Lungs:     Diminished R>L, few exp wheezes, respirations unlabored  Heart:    Tachycardic.   MS:   All  extremities are intact.    Neurologic:   Awake, alert, oriented x 3       Data Reviewed: I have personally reviewed following labs and imaging studies  CBC: Recent Labs  Lab 08/09/20 0636 08/10/20 0330 08/11/20 0345 08/13/20 0340  WBC 21.2* 14.5* 12.3* 10.9*  NEUTROABS 16.5*  --  8.8*  --   HGB 11.1* 9.8* 9.5* 9.2*  HCT 34.5* 30.7* 30.2* 28.8*  MCV 81.9 83.0 83.0 83.0  PLT 355 309 339 099   Basic Metabolic Panel: Recent Labs  Lab 08/09/20 0636 08/10/20 0330 08/11/20 0345 08/13/20 0340  NA 132* 137 133* 137  K 4.1 4.0 4.0 3.7  CL 98 103 99 101  CO2 21* 26 25 25   GLUCOSE 164* 130* 116* 103*  BUN 10 12 13 11   CREATININE 1.09 1.02 1.11 0.95  CALCIUM 8.5* 8.7* 8.6* 8.6*   GFR: Estimated Creatinine Clearance: 59.7 mL/min (by C-G formula based on SCr of 0.95 mg/dL). Liver Function Tests: Recent Labs  Lab 08/09/20 0636  AST 99*  ALT 65*  ALKPHOS 82  BILITOT 1.3*  PROT 6.9  ALBUMIN 2.6*   No results for input(s): LIPASE, AMYLASE in the last 168 hours. No results for input(s): AMMONIA in the last 168 hours. Coagulation Profile: No results for input(s): INR, PROTIME in the last 168 hours. Cardiac Enzymes: No results for input(s): CKTOTAL, CKMB, CKMBINDEX, TROPONINI in the last 168 hours. BNP (last 3 results) No results for input(s): PROBNP in the last 8760 hours. HbA1C: No results for input(s): HGBA1C in the last 72 hours. CBG: No results for input(s): GLUCAP in the last 168 hours. Lipid Profile: No results for input(s): CHOL, HDL, LDLCALC, TRIG, CHOLHDL, LDLDIRECT in the last 72 hours. Thyroid Function Tests: No results for input(s): TSH, T4TOTAL, FREET4, T3FREE, THYROIDAB in the last 72 hours.  Anemia Panel: No results for input(s): VITAMINB12, FOLATE, FERRITIN, TIBC, IRON, RETICCTPCT in the last 72 hours. Sepsis Labs: Recent Labs  Lab 08/10/20 0753  PROCALCITON 0.33    Recent Results (from the past 240 hour(s))  Resp Panel by RT-PCR (Flu A&B,  Covid) Nasopharyngeal Swab     Status: None   Collection Time: 08/09/20  6:51 AM   Specimen: Nasopharyngeal Swab; Nasopharyngeal(NP) swabs in vial transport medium  Result Value Ref Range Status   SARS Coronavirus 2 by RT PCR NEGATIVE NEGATIVE Final    Comment: (NOTE) SARS-CoV-2 target nucleic acids are NOT DETECTED.  The SARS-CoV-2 RNA is generally detectable in upper respiratory specimens during the acute phase of infection. The lowest concentration of SARS-CoV-2 viral copies this assay can  detect is 138 copies/mL. A negative result does not preclude SARS-Cov-2 infection and should not be used as the sole basis for treatment or other patient management decisions. A negative result may occur with  improper specimen collection/handling, submission of specimen other than nasopharyngeal swab, presence of viral mutation(s) within the areas targeted by this assay, and inadequate number of viral copies(<138 copies/mL). A negative result must be combined with clinical observations, patient history, and epidemiological information. The expected result is Negative.  Fact Sheet for Patients:  EntrepreneurPulse.com.au  Fact Sheet for Healthcare Providers:  IncredibleEmployment.be  This test is no t yet approved or cleared by the [place] FDA and  has been authorized for detection and/or diagnosis of SARS-CoV-2 by FDA under an Emergency Use Authorization (EUA). This EUA will remain  in effect (meaning this test can be used) for the duration of the COVID-19 declaration under Section 564(b)(1) of the Act, 21 U.S.C.section 360bbb-3(b)(1), unless the authorization is terminated  or revoked sooner.       Influenza A by PCR NEGATIVE NEGATIVE Final   Influenza B by PCR NEGATIVE NEGATIVE Final    Comment: (NOTE) The Xpert Xpress SARS-CoV-2/FLU/RSV plus assay is intended as an aid in the diagnosis of influenza from Nasopharyngeal swab specimens and should  not be used as a sole basis for treatment. Nasal washings and aspirates are unacceptable for Xpert Xpress SARS-CoV-2/FLU/RSV testing.  Fact Sheet for Patients: EntrepreneurPulse.com.au  Fact Sheet for Healthcare Providers: IncredibleEmployment.be  This test is not yet approved or cleared by the [place] FDA and has been authorized for detection and/or diagnosis of SARS-CoV-2 by FDA under an Emergency Use Authorization (EUA). This EUA will remain in effect (meaning this test can be used) for the duration of the COVID-19 declaration under Section 564(b)(1) of the Act, 21 U.S.C. section 360bbb-3(b)(1), unless the authorization is terminated or revoked.  Performed at [facility], [place] [address], [place], [place] [address]   Culture, blood (routine x 2) Call MD if unable to obtain prior to antibiotics being given     Status: None (Preliminary result)   Collection Time: 08/09/20 12:29 PM   Specimen: BLOOD  Result Value Ref Range Status   Specimen Description   Final    BLOOD BLOOD LEFT FOREARM Performed at [place] [address], [place], [place] [address]    Special Requests   Final    BOTTLES DRAWN AEROBIC AND ANAEROBIC Blood Culture results may not be optimal due to an inadequate volume of blood received in culture bottles Performed at [place] [address] [place], [place] [address]    Culture   Final    NO GROWTH 4 DAYS Performed at [facility], [place] [address] [place], [place] [address]    Report Status PENDING  Incomplete  Culture, blood (routine x 2) Call MD if unable to obtain prior to antibiotics being given     Status: None (Preliminary result)   Collection Time: 08/09/20 12:30 PM   Specimen: BLOOD  Result Value Ref Range Status   Specimen Description   Final    BLOOD RIGHT ANTECUBITAL Performed at [place] [address] [place], [place] [address]    Special Requests   Final    BOTTLES DRAWN AEROBIC AND ANAEROBIC Blood Culture results may not be optimal due to an inadequate volume of blood received in culture bottles Performed at [place] [address] [place],  [place] [address]    Culture   Final    NO GROWTH 4 DAYS Performed at [facility], [place] [address], [place], [place] [address]    Report Status PENDING  Incomplete  Expectorated Sputum Assessment w Gram Stain, Rflx to Resp Cult     Status: None   Collection Time: 08/10/20  7:13 PM   Specimen: Expectorated Sputum  Result Value Ref Range Status   Specimen Description EXPECTORATED SPUTUM  Final   Special Requests NONE  Final   Sputum evaluation   Final    THIS SPECIMEN IS ACCEPTABLE FOR SPUTUM CULTURE Performed at [facility], [place] [address] [place], [place] [address]    Report Status 08/10/2020 FINAL  Final  Culture, Respiratory w Gram Stain     Status: None   Collection Time: 08/10/20  7:13 PM  Result Value Ref Range Status   Specimen Description   Final    EXPECTORATED SPUTUM Performed at [facility], [place] [address], [place], [place] [address]    Special Requests   Final    NONE Reflexed from [phone] Performed at [facility], [place] [address], [place], [place] [address]    Gram Stain   Final    RARE WBC PRESENT, PREDOMINANTLY PMN NO ORGANISMS SEEN    Culture   Final    RARE Normal respiratory flora-no Staph aureus or Pseudomonas seen Performed at [place] [address] [place], [place] [address]    Report Status 08/13/2020 FINAL  Final      Radiology Studies: [place] CORE BIOPSY (LYMPH NODES)  Result Date: 08/13/2020 INDICATION: Pancreatic mass, liver lesions, pulmonary masses and lymphadenopathy including palpable enlarged left supraclavicular lymph node. EXAM: ULTRASOUND GUIDED CORE BIOPSY OF LEFT SUPRACLAVICULAR LYMPH NODE  MEDICATIONS: None. ANESTHESIA/SEDATION: Fentanyl 50 mcg IV; Versed 1.0 mg IV Moderate Sedation Time:  10 minutes. The patient was continuously monitored during the procedure by the interventional radiology nurse under my direct supervision. PROCEDURE: The procedure, risks, benefits, and alternatives were explained to the patient. Questions regarding the procedure were encouraged and answered. The patient understands and consents to the procedure. A time-out was performed prior to initiating the procedure. The left neck was prepped with chlorhexidine in a sterile fashion, and a sterile drape was applied covering the operative field. A sterile gown and sterile gloves were used for the procedure. Local anesthesia was provided with 1% Lidocaine. After localizing abnormal enlarged left lower neck/supraclavicular lymph nodes, core biopsy samples were obtained with an 18 gauge core biopsy device. Four samples were obtained and submitted in formalin. COMPLICATIONS: None immediate. FINDINGS: Enlarged lymph node mass in the left supraclavicular region measures at least 5.2 cm in greatest diameter. Solid tissue was obtained. IMPRESSION: Ultrasound-guided core biopsy performed of enlarged lymph node mass in the left supraclavicular region. Electronically Signed   By: Aletta Edouard M.D.   On: 08/13/2020 09:19    Scheduled Meds:  amLODipine  10 mg Oral Daily   buPROPion  300 mg Oral Daily   chlorhexidine  15 mL Mouth Rinse BID   cholecalciferol  2,000 Units Oral Daily   enoxaparin (LOVENOX) injection  40 mg Subcutaneous Daily   famotidine  20 mg Oral Daily   feeding supplement  237 mL Oral Daily   gabapentin  300 mg Oral TID   levalbuterol  0.63 mg Nebulization TID   loratadine  10 mg Oral Daily   losartan  25 mg Oral Daily  mouth rinse  15 mL Mouth Rinse q12n4p   pantoprazole  40 mg Oral Daily   polyethylene glycol  17 g Oral Daily   pravastatin  80 mg Oral Daily   Continuous Infusions:  sodium chloride  Stopped (08/11/20 1341)   cefTRIAXone (ROCEPHIN)  IV 2 g (08/12/20 1337)     LOS: 3 days   Time spent: 36 minutes   Geradine Girt, DO [facility]  08/13/2020, 11:29 AM   How to contact the [facility] Attending or Consulting provider [place] or covering provider during after hours [place], for this patient?  Check the care team in [facility] and look for a) attending/consulting TRH provider listed and b) the [facility] team listed. Page or secure chat [phone]. Log into [email or web address] and use [place]'s universal password to access. If you do not have the password, please contact the hospital operator. Locate the [facility] provider you are looking for under [facility] and page to a number that you can be directly reached. If you still have difficulty reaching the provider, please page the [facility] (Director on Call) for the Hospitalists listed on amion for assistance.

## 2020-08-13 NOTE — Plan of Care (Signed)
  Problem: Activity: Goal: Risk for activity intolerance will decrease Outcome: Progressing   Problem: Elimination: Goal: Will not experience complications related to bowel motility Outcome: Progressing Goal: Will not experience complications related to urinary retention Outcome: Progressing   Problem: Pain Managment: Goal: General experience of comfort will improve Outcome: Progressing

## 2020-08-14 DIAGNOSIS — K8689 Other specified diseases of pancreas: Secondary | ICD-10-CM | POA: Diagnosis not present

## 2020-08-14 DIAGNOSIS — J9601 Acute respiratory failure with hypoxia: Secondary | ICD-10-CM | POA: Diagnosis not present

## 2020-08-14 DIAGNOSIS — A419 Sepsis, unspecified organism: Secondary | ICD-10-CM | POA: Diagnosis not present

## 2020-08-14 DIAGNOSIS — I10 Essential (primary) hypertension: Secondary | ICD-10-CM | POA: Diagnosis not present

## 2020-08-14 DIAGNOSIS — R6520 Severe sepsis without septic shock: Secondary | ICD-10-CM

## 2020-08-14 LAB — CULTURE, BLOOD (ROUTINE X 2)
Culture: NO GROWTH
Culture: NO GROWTH

## 2020-08-14 MED ORDER — METHYLPREDNISOLONE SODIUM SUCC 40 MG IJ SOLR
40.0000 mg | INTRAMUSCULAR | Status: DC
  Administered 2020-08-14 – 2020-08-20 (×7): 40 mg via INTRAVENOUS
  Filled 2020-08-14 (×7): qty 1

## 2020-08-14 MED ORDER — FUROSEMIDE 10 MG/ML IJ SOLN
40.0000 mg | Freq: Once | INTRAMUSCULAR | Status: AC
  Administered 2020-08-14: 40 mg via INTRAVENOUS
  Filled 2020-08-14: qty 4

## 2020-08-14 MED ORDER — IPRATROPIUM-ALBUTEROL 0.5-2.5 (3) MG/3ML IN SOLN
3.0000 mL | Freq: Four times a day (QID) | RESPIRATORY_TRACT | Status: DC
  Administered 2020-08-14 – 2020-08-16 (×6): 3 mL via RESPIRATORY_TRACT
  Filled 2020-08-14 (×6): qty 3

## 2020-08-14 MED ORDER — SODIUM CHLORIDE 0.9 % IV SOLN
1.0000 g | INTRAVENOUS | Status: DC
  Administered 2020-08-14 – 2020-08-17 (×4): 1 g via INTRAVENOUS
  Filled 2020-08-14 (×4): qty 1

## 2020-08-14 MED ORDER — ONDANSETRON HCL 4 MG/2ML IJ SOLN
4.0000 mg | Freq: Four times a day (QID) | INTRAMUSCULAR | Status: DC | PRN
  Administered 2020-08-14 – 2020-08-16 (×2): 4 mg via INTRAVENOUS
  Filled 2020-08-14 (×3): qty 2

## 2020-08-14 MED ORDER — IPRATROPIUM-ALBUTEROL 0.5-2.5 (3) MG/3ML IN SOLN: 3.0000 mL | RESPIRATORY_TRACT | Status: DC | PRN

## 2020-08-14 NOTE — Progress Notes (Signed)
Physical Therapy Treatment Patient Details Name: Grant Taylor MRN: [id number] DOB: 1945-03-05 Today's Date: 08/14/2020    History of Present Illness Pt is 75 yo male admitted with sepsis, acute resp failure due to PNE and mets to lung on 08/09/20.  Pt with hx of metastatic pancreatic CA and COPD.    PT Comments    Pt able to increase gait distance today with 1 standing rest break.  He did require min guard for safety and reports fatigue.  Pt was on 6 L O2 with sats 87% activity and 95% rest.  Continue to progress as able.      Follow Up Recommendations  Home health PT;Supervision - Intermittent     Equipment Recommendations  None recommended by PT    Recommendations for Other Services       Precautions / Restrictions Precautions Precautions: Fall Precaution Comments: prefers shoes; monitor O2    Mobility  Bed Mobility Overal bed mobility: Modified Independent             General bed mobility comments: Pt at EOB upon arrival    Transfers Overall transfer level: Needs assistance Equipment used: Lofstrands (single lofstrand on R) Transfers: Sit to/from Stand Sit to Stand: Min guard            Ambulation/Gait Ambulation/Gait assistance: Counsellor (Feet): 150 Feet Assistive device: Lofstrands Gait Pattern/deviations: Decreased dorsiflexion - left;Step-through pattern (mild circumduction on L) Gait velocity: decreased   General Gait Details: Single lofstrand crutch on R side; ambulated 150' with 1 standing rest break; min guard to steady ; cued for breathing technique and rest break   Stairs             Wheelchair Mobility    Modified Rankin (Stroke Patients Only)       Balance Overall balance assessment: Needs assistance   Sitting balance-Leahy Scale: Good     Standing balance support: Single extremity supported Standing balance-Leahy Scale: Poor Standing balance comment: Required single UE support and min guard                             Cognition Arousal/Alertness: Awake/alert Behavior During Therapy: WFL for tasks assessed/performed Overall Cognitive Status: Within Functional Limits for tasks assessed                                        Exercises      General Comments General comments (skin integrity, edema, etc.): Pt on 6 L O2 with sats 95% rest and 87% ambulation but recovering in < 30 seconds to 90% with rest.  Preparring for LE exercises but lunch arrived and pt wanting to eat.  Encouraged to perform seated/supine AROM (therapist demonstrated hip ABD/ADD, heel slides, LAQ, and hip flexion in seated position) as able - pt verbalized understandign.      Pertinent Vitals/Pain Pain Assessment: No/denies pain    Home Living                      Prior Function            PT Goals (current goals can now be found in the care plan section) Acute Rehab PT Goals Patient Stated Goal: home soon PT Goal Formulation: With patient Time For Goal Achievement: 08/26/20 Potential to Achieve Goals: Good Progress towards PT goals: Progressing toward  goals    Frequency    Min 3X/week      PT Plan Current plan remains appropriate    Co-evaluation              AM-PAC PT "6 Clicks" Mobility   Outcome Measure  Help needed turning from your back to your side while in a flat bed without using bedrails?: None Help needed moving from lying on your back to sitting on the side of a flat bed without using bedrails?: None Help needed moving to and from a bed to a chair (including a wheelchair)?: A Little Help needed standing up from a chair using your arms (e.g., wheelchair or bedside chair)?: A Little Help needed to walk in hospital room?: A Little Help needed climbing 3-5 steps with a railing? : A Lot 6 Click Score: 19    End of Session Equipment Utilized During Treatment: Gait belt;Oxygen Activity Tolerance: Patient tolerated treatment  well Patient left: in chair;with call bell/phone within reach;with chair alarm set;with family/visitor present Nurse Communication: Mobility status PT Visit Diagnosis: Difficulty in walking, not elsewhere classified (R26.2);Muscle weakness (generalized) (M62.81);Unsteadiness on feet (R26.81)     Time: 3295-1884 PT Time Calculation (min) (ACUTE ONLY): 22 min  Charges:  $Gait Training: 8-22 mins                     Abran Richard, PT Acute Rehab Services Pager [phone] [facility] 08/14/2020, 1:17 PM

## 2020-08-14 NOTE — Progress Notes (Signed)
PROGRESS NOTE    Grant Taylor  EGB:[id number] DOB: [birth year] DOA: 08/09/2020 PCP: Grant Mellow, MD    Brief Narrative:  Grant Taylor was admitted to the hospital with working diagnosis of acute hypoxemic respiratory failure due to community-acquired pneumonia, complicated with severe sepsis, present on admission.  75 year old male past medical history of hypertension, COPD and dyslipidemia who presented with dyspnea.  Recently diagnosed with pancreatic mass July 26, 2020 at the [facility] in [place].  The night prior to hospitalization he developed significant cough, associated with dyspnea severe enough that prompted him to come to the hospital.  On his initial physical examination his heart rate was 130, blood pressure 150/78, respirate 24, temperature 98.3, oxygen saturation 93%.  His lungs had no rhonchi or rales, heart S1-S2 present, rhythmic, tachycardic, abdomen was soft nontender, no lower extremity edema.   Chest radiograph with bilateral interstitial infiltrates, lower lobes and upper lobes, positive vascular congestion.  CT with multiple pulmonary nodules and masses compatible with metastasis. Bulky mediastinal and bilateral hilar adenopathy. CT of the abdomen pelvis with large mass involving the pancreatic tail up to 10 cm.  Extension into the left retroperitoneum and possible involvement of the left hip.  Assessment & Plan:   Principal Problem:   Acute respiratory failure with hypoxia (HCC) Active Problems:   Essential hypertension   COPD (chronic obstructive pulmonary disease) (HCC)   Pancreatic mass   Severe sepsis (HCC)   Acute hypoxemic respiratory failure due to community acquired pneumonia in the setting of metastatic lung disease. Patient continue to have cough and worsening dyspnea.  Oxygenation is 93% on 6 L/min per [place] Chest film with bilateral interstitial infiltrates with more dense infiltrate at the right lowe lobe (07/19) personally reviewed.    Continue with antibiotic therapy with ceftriaxone. Add one dose of furosemide 40 mg to target negative fluid balance, possible component of non cardiogenic pulmonary edema. Continue with oxymetry monitoring, supplemental 02 per [place], to targer 02 saturation more than 92%. Bronchodilator therapy and airway clearing techniques.   2. Pancreatic cancer. Signs of metastatic disease, pending results of lymph node biopsy. Continue to follow up with oncology recommendations  3. HTN/dyslipidemia. Continue blood pressure control with losartan and amlodipine. Continue pravastatin.   4. COPD. Continue bronchodilator therapy and considering worsening dyspnea and productive cough, likely exacerbation, will add systemic steroids    Patient continue to be at high risk for worsening hypoxemic respiratory failure   Status is: Inpatient  Remains inpatient appropriate because:Inpatient level of care appropriate due to severity of illness  Dispo: The patient is from: Home              Anticipated d/c is to: Home              Patient currently is not medically stable to d/c.   Difficult to place patient No   DVT prophylaxis: Enoxaparin   Code Status:   full  Family Communication:  I spoke with patient's daughter at the bedside, we talked in detail about patient's condition, plan of care and prognosis and all questions were addressed.    Consultants:  IR    Antimicrobials:  Ceftriaxone     Subjective: Patient continue with worsening dyspnea, congestion and increase sputum production.   Objective: Vitals:   08/14/20 0819 08/14/20 0827 08/14/20 1428 08/14/20 1504  BP:    121/80  Pulse:  (!) 103  (!) 102  Resp:  20  20  Temp:    98.4 F (36.9 C)  TempSrc:    Oral  SpO2: (!) 85% 92% 93% 97%  Weight:      Height:        Intake/Output Summary (Last 24 hours) at 08/14/2020 1645 Last data filed at 08/14/2020 1500 Gross per 24 hour  Intake 240 ml  Output 1150 ml  Net -910 ml   Filed  Weights   08/09/20 1536  Weight: 63.8 kg    Examination:   General: positive dyspnea at rest, deconditioned  Neurology: Awake and alert, non focal  E ENT: no pallor, no icterus, oral mucosa moist Cardiovascular: No JVD. S1-S2 present, rhythmic, no gallops, rubs, or murmurs. No lower extremity edema. Pulmonary: positive breath sounds bilaterally, with no wheezing but diffuse rhonchi and rales. Gastrointestinal. Abdomen soft,  Skin. No rashes Musculoskeletal: no joint deformities     Data Reviewed: I have personally reviewed following labs and imaging studies  CBC: Recent Labs  Lab 08/09/20 0636 08/10/20 0330 08/11/20 0345 08/13/20 0340  WBC 21.2* 14.5* 12.3* 10.9*  NEUTROABS 16.5*  --  8.8*  --   HGB 11.1* 9.8* 9.5* 9.2*  HCT 34.5* 30.7* 30.2* 28.8*  MCV 81.9 83.0 83.0 83.0  PLT 355 309 339 062   Basic Metabolic Panel: Recent Labs  Lab 08/09/20 0636 08/10/20 0330 08/11/20 0345 08/13/20 0340  NA 132* 137 133* 137  K 4.1 4.0 4.0 3.7  CL 98 103 99 101  CO2 21* 26 25 25   GLUCOSE 164* 130* 116* 103*  BUN 10 12 13 11   CREATININE 1.09 1.02 1.11 0.95  CALCIUM 8.5* 8.7* 8.6* 8.6*   GFR: Estimated Creatinine Clearance: 59.7 mL/min (by C-G formula based on SCr of 0.95 mg/dL). Liver Function Tests: Recent Labs  Lab 08/09/20 0636  AST 99*  ALT 65*  ALKPHOS 82  BILITOT 1.3*  PROT 6.9  ALBUMIN 2.6*   No results for input(s): LIPASE, AMYLASE in the last 168 hours. No results for input(s): AMMONIA in the last 168 hours. Coagulation Profile: No results for input(s): INR, PROTIME in the last 168 hours. Cardiac Enzymes: No results for input(s): CKTOTAL, CKMB, CKMBINDEX, TROPONINI in the last 168 hours. BNP (last 3 results) No results for input(s): PROBNP in the last 8760 hours. HbA1C: No results for input(s): HGBA1C in the last 72 hours. CBG: No results for input(s): GLUCAP in the last 168 hours. Lipid Profile: No results for input(s): CHOL, HDL, LDLCALC, TRIG,  CHOLHDL, LDLDIRECT in the last 72 hours. Thyroid Function Tests: No results for input(s): TSH, T4TOTAL, FREET4, T3FREE, THYROIDAB in the last 72 hours. Anemia Panel: No results for input(s): VITAMINB12, FOLATE, FERRITIN, TIBC, IRON, RETICCTPCT in the last 72 hours.    Radiology Studies: I have reviewed all of the imaging during this hospital visit personally     Scheduled Meds:  amLODipine  10 mg Oral Daily   buPROPion  300 mg Oral Daily   chlorhexidine  15 mL Mouth Rinse BID   cholecalciferol  2,000 Units Oral Daily   enoxaparin (LOVENOX) injection  40 mg Subcutaneous Daily   famotidine  20 mg Oral Daily   feeding supplement  237 mL Oral Daily   gabapentin  300 mg Oral TID   levalbuterol  0.63 mg Nebulization Q6H   loratadine  10 mg Oral Daily   losartan  25 mg Oral Daily   mouth rinse  15 mL Mouth Rinse q12n4p   pantoprazole  40 mg Oral Daily   polyethylene glycol  17 g Oral Daily   pravastatin  80 mg Oral Daily   Continuous Infusions:  sodium chloride Stopped (08/11/20 1341)     LOS: 4 days        Adrik Khim Gerome Apley, MD

## 2020-08-14 NOTE — TOC Progression Note (Addendum)
Transition of Care [facility]) - Progression Note    Patient Details  Name: Grant Taylor MRN: [id number] Date of Birth: 19-Feb-1945  Transition of Care [facility]) CM/SW Contact  Purcell Mouton, RN Phone Number: 08/14/2020, 10:26 AM  Clinical Narrative:    Pt is active at [facility] in [place] with Dr. [name] [phone]. [facility] [place] [phone] ext [phone][facility]. If pt will need O2, will need to order Thurs for delivery by Friday. Will need to go through the [facility]. Pt's daughter was given National number to call to let [place] know pt was in hospital.    Expected Discharge Plan: Home/Self Care Barriers to Discharge: Continued Medical Work up  Expected Discharge Plan and Services Expected Discharge Plan: Home/Self Care   Discharge Planning Services: CM Consult Post Acute Care Choice: Durable Medical Equipment Living arrangements for the past 2 months: Single Family Home                                       Social Determinants of Health (SDOH) Interventions    Readmission Risk Interventions No flowsheet data found.

## 2020-08-15 ENCOUNTER — Encounter: Payer: No Typology Code available for payment source | Admitting: Physical Medicine & Rehabilitation

## 2020-08-15 DIAGNOSIS — K8689 Other specified diseases of pancreas: Secondary | ICD-10-CM | POA: Diagnosis not present

## 2020-08-15 DIAGNOSIS — J441 Chronic obstructive pulmonary disease with (acute) exacerbation: Secondary | ICD-10-CM | POA: Diagnosis not present

## 2020-08-15 DIAGNOSIS — J9601 Acute respiratory failure with hypoxia: Secondary | ICD-10-CM | POA: Diagnosis not present

## 2020-08-15 DIAGNOSIS — I10 Essential (primary) hypertension: Secondary | ICD-10-CM | POA: Diagnosis not present

## 2020-08-15 LAB — CBC WITH DIFFERENTIAL/PLATELET
Abs Immature Granulocytes: 0.05 10*3/uL (ref 0.00–0.07)
Basophils Absolute: 0 10*3/uL (ref 0.0–0.1)
Basophils Relative: 0 %
Eosinophils Absolute: 0 10*3/uL (ref 0.0–0.5)
Eosinophils Relative: 0 %
HCT: 31.3 % — ABNORMAL LOW (ref 39.0–52.0)
Hemoglobin: 9.7 g/dL — ABNORMAL LOW (ref 13.0–17.0)
Immature Granulocytes: 1 %
Lymphocytes Relative: 10 %
Lymphs Abs: 0.8 10*3/uL (ref 0.7–4.0)
MCH: 25.8 pg — ABNORMAL LOW (ref 26.0–34.0)
MCHC: 31 g/dL (ref 30.0–36.0)
MCV: 83.2 fL (ref 80.0–100.0)
Monocytes Absolute: 0.2 10*3/uL (ref 0.1–1.0)
Monocytes Relative: 2 %
Neutro Abs: 7.4 10*3/uL (ref 1.7–7.7)
Neutrophils Relative %: 87 %
Platelets: 441 10*3/uL — ABNORMAL HIGH (ref 150–400)
RBC: 3.76 MIL/uL — ABNORMAL LOW (ref 4.22–5.81)
RDW: 16.3 % — ABNORMAL HIGH (ref 11.5–15.5)
WBC: 8.5 10*3/uL (ref 4.0–10.5)
nRBC: 0 % (ref 0.0–0.2)

## 2020-08-15 LAB — BASIC METABOLIC PANEL
Anion gap: 13 (ref 5–15)
BUN: 13 mg/dL (ref 8–23)
CO2: 26 mmol/L (ref 22–32)
Calcium: 9.4 mg/dL (ref 8.9–10.3)
Chloride: 99 mmol/L (ref 98–111)
Creatinine, Ser: 0.94 mg/dL (ref 0.61–1.24)
GFR, Estimated: >60 mL/min (ref >60)
Glucose, Bld: 155 mg/dL — ABNORMAL HIGH (ref 70–99)
Potassium: 4.5 mmol/L (ref 3.5–5.1)
Sodium: 138 mmol/L (ref 135–145)

## 2020-08-15 MED ORDER — HYDROCOD POLST-CPM POLST ER 10-8 MG/5ML PO SUER
5.0000 mL | Freq: Two times a day (BID) | ORAL | Status: DC
  Administered 2020-08-15 – 2020-08-22 (×14): 5 mL via ORAL
  Filled 2020-08-15 (×14): qty 5

## 2020-08-15 MED ORDER — HYDROMORPHONE HCL 1 MG/ML IJ SOLN
0.5000 mg | INTRAMUSCULAR | Status: DC | PRN
  Administered 2020-08-15 – 2020-08-21 (×4): 0.5 mg via INTRAVENOUS
  Filled 2020-08-15 (×4): qty 0.5

## 2020-08-15 MED ORDER — OXYCODONE HCL 5 MG PO TABS
5.0000 mg | ORAL_TABLET | ORAL | Status: DC | PRN
  Administered 2020-08-15 – 2020-08-22 (×23): 5 mg via ORAL
  Filled 2020-08-15 (×23): qty 1

## 2020-08-15 NOTE — Progress Notes (Signed)
[facility] PROGRESS NOTE    Grant Taylor  RDE:[id number] DOB: 09/24/1945 DOA: 08/09/2020 PCP: [name], MD    Brief Narrative:  Grant Taylor was admitted to the hospital with working diagnosis of acute hypoxemic respiratory failure due to community-acquired pneumonia/ pulmonary metastatic disease, complicated with severe sepsis, present on admission.   75 year old male past medical history of hypertension, COPD and dyslipidemia who presented with dyspnea.  Recently diagnosed with pancreatic mass July 26, 2020 at the [facility] in [place].  The night prior to hospitalization he developed significant cough, associated with dyspnea which was severe enough that prompted him to come to the hospital.  On his initial physical examination his heart rate was 130, blood pressure 150/78, respiratory rate 24, temperature 98.3, oxygen saturation 93%.  His lungs had positive rhonchi and rales, heart S1-S2 present, rhythmic, tachycardic, abdomen was soft nontender, no lower extremity edema.    Chest radiograph with bilateral interstitial infiltrates, lower lobes and upper lobes, positive vascular congestion.   CT with multiple pulmonary nodules and masses compatible with metastasis. Bulky mediastinal and bilateral hilar adenopathy. CT of the abdomen pelvis with large mass involving the pancreatic tail up to 10 cm.  Extension into the left retroperitoneum and possible involvement of the left hip.  Underwent left neck supraclavicular lymph node biopsy.   Patient was placed on bronchodilator therapy, supplemental oxygen, and IV antibiotics. Suspected non cardiogenic pulmonary edema and COPD exacerbation.    Assessment & Plan:   Principal Problem:   Acute respiratory failure with hypoxia (HCC) Active Problems:   Essential hypertension   COPD (chronic obstructive pulmonary disease) (HCC)   Pancreatic mass   Severe sepsis (HCC)     Acute hypoxemic respiratory failure due to community  acquired pneumonia in the setting of metastatic lung disease. Acute COPD exacerbation.  Patient continue to have cough, with mild improvement in dyspnea compared to yesterday.  Today's oxygenation is 95% on 5 L/min per [place] Urine output 2,100 ml.  Chest film with bilateral interstitial infiltrates with more dense infiltrate at the right lowe lobe (07/19) personally reviewed.   Plan to continue antibiotic therapy with ceftriaxone for 5 days. Had one dose of 40 mg furosemide yesterday for possible component of non cardiogenic pulmonary edema. Continue with bronchodilator therapy and airway clearing techniques. Antitussive agents. Systemic steroids for COPD exacerbation. Check echocardiogram.   Out of bed to chair and ambulate with physical therapy. Continue oxymetry monitoring and supplemental 02 per [place] to keep 02 saturation more than 92% Patient will likely need home 02 at discharge.     2. Pancreatic cancer. Positive signs of metastatic disease, continue pending results of lymph node biopsy. Will need to get oncology recommendations  Continue pain control with hydromorphone and oxycodone.    3. HTN/dyslipidemia.  Continue with losartan and amlodipine for blood pressure control. On pravastatin.  4. Depression. Continue with bupropion.  Patient continue to be at high risk for worsening hypoxemic respiratory failure   Status is: Inpatient  Remains inpatient appropriate because:Inpatient level of care appropriate due to severity of illness  Dispo: The patient is from: Home              Anticipated d/c is to: Home              Patient currently is not medically stable to d/c.   Difficult to place patient No   DVT prophylaxis: Enoxaparin   Code Status:   full  Family Communication:  No family at the bedside  Consultants:  IR   Procedures:  Left neck supraclavicular lymph node biopsy   Antimicrobials:  Ceftriaxone     Subjective: Patient continue to have dyspnea and  productive cough, not yet back to baseline, no nausea or vomiting, continue to be very weak and deconditioned   Objective: Vitals:   08/14/20 1504 08/14/20 2030 08/15/20 0622 08/15/20 0823  BP: 121/80 140/84 124/84   Pulse: (!) 102 (!) 106 97   Resp: 20 20 18    Temp: 98.4 F (36.9 C) 98.4 F (36.9 C) 97.9 F (36.6 C)   TempSrc: Oral Oral Oral   SpO2: 97% 94% 94% 94%  Weight:      Height:        Intake/Output Summary (Last 24 hours) at 08/15/2020 1044 Last data filed at 08/15/2020 3762 Gross per 24 hour  Intake 100 ml  Output 2100 ml  Net -2000 ml   Filed Weights   08/09/20 1536  Weight: 63.8 kg    Examination:   General: Not in pain, positive dyspnea at rest  Neurology: Awake and alert, non focal  E ENT: mild pallor, no icterus, oral mucosa moist Cardiovascular: No JVD. S1-S2 present, rhythmic, no gallops, rubs, or murmurs. No lower extremity edema. Pulmonary: positive breath sounds bilaterally, with no wheezing, positive bilateral rhonchi and  rales. Gastrointestinal. Abdomen soft and non tender Skin. No rashes Musculoskeletal: no joint deformities     Data Reviewed: I have personally reviewed following labs and imaging studies  CBC: Recent Labs  Lab 08/09/20 0636 08/10/20 0330 08/11/20 0345 08/13/20 0340 08/15/20 0354  WBC 21.2* 14.5* 12.3* 10.9* 8.5  NEUTROABS 16.5*  --  8.8*  --  7.4  HGB 11.1* 9.8* 9.5* 9.2* 9.7*  HCT 34.5* 30.7* 30.2* 28.8* 31.3*  MCV 81.9 83.0 83.0 83.0 83.2  PLT 355 309 339 354 831*   Basic Metabolic Panel: Recent Labs  Lab 08/09/20 0636 08/10/20 0330 08/11/20 0345 08/13/20 0340 08/15/20 0354  NA 132* 137 133* 137 138  K 4.1 4.0 4.0 3.7 4.5  CL 98 103 99 101 99  CO2 21* 26 25 25 26   GLUCOSE 164* 130* 116* 103* 155*  BUN 10 12 13 11 13   CREATININE 1.09 1.02 1.11 0.95 0.94  CALCIUM 8.5* 8.7* 8.6* 8.6* 9.4   GFR: Estimated Creatinine Clearance: 60.3 mL/min (by C-G formula based on SCr of 0.94 mg/dL). Liver Function  Tests: Recent Labs  Lab 08/09/20 0636  AST 99*  ALT 65*  ALKPHOS 82  BILITOT 1.3*  PROT 6.9  ALBUMIN 2.6*   No results for input(s): LIPASE, AMYLASE in the last 168 hours. No results for input(s): AMMONIA in the last 168 hours. Coagulation Profile: No results for input(s): INR, PROTIME in the last 168 hours. Cardiac Enzymes: No results for input(s): CKTOTAL, CKMB, CKMBINDEX, TROPONINI in the last 168 hours. BNP (last 3 results) No results for input(s): PROBNP in the last 8760 hours. HbA1C: No results for input(s): HGBA1C in the last 72 hours. CBG: No results for input(s): GLUCAP in the last 168 hours. Lipid Profile: No results for input(s): CHOL, HDL, LDLCALC, TRIG, CHOLHDL, LDLDIRECT in the last 72 hours. Thyroid Function Tests: No results for input(s): TSH, T4TOTAL, FREET4, T3FREE, THYROIDAB in the last 72 hours. Anemia Panel: No results for input(s): VITAMINB12, FOLATE, FERRITIN, TIBC, IRON, RETICCTPCT in the last 72 hours.    Radiology Studies: I have reviewed all of the imaging during this hospital visit personally     Scheduled Meds:  amLODipine  10 mg Oral Daily   buPROPion  300 mg Oral Daily   chlorhexidine  15 mL Mouth Rinse BID   cholecalciferol  2,000 Units Oral Daily   enoxaparin (LOVENOX) injection  40 mg Subcutaneous Daily   famotidine  20 mg Oral Daily   feeding supplement  237 mL Oral Daily   gabapentin  300 mg Oral TID   ipratropium-albuterol  3 mL Nebulization QID   loratadine  10 mg Oral Daily   losartan  25 mg Oral Daily   mouth rinse  15 mL Mouth Rinse q12n4p   methylPREDNISolone (SOLU-MEDROL) injection  40 mg Intravenous Q24H   pantoprazole  40 mg Oral Daily   polyethylene glycol  17 g Oral Daily   pravastatin  80 mg Oral Daily   Continuous Infusions:  sodium chloride Stopped (08/11/20 1341)   cefTRIAXone (ROCEPHIN)  IV 1 g (08/14/20 2042)     LOS: 5 days        Sadiya Durand Gerome Apley, MD

## 2020-08-16 ENCOUNTER — Encounter (HOSPITAL_COMMUNITY): Payer: Self-pay | Admitting: Family Medicine

## 2020-08-16 DIAGNOSIS — I10 Essential (primary) hypertension: Secondary | ICD-10-CM

## 2020-08-16 DIAGNOSIS — C787 Secondary malignant neoplasm of liver and intrahepatic bile duct: Secondary | ICD-10-CM | POA: Diagnosis not present

## 2020-08-16 DIAGNOSIS — D649 Anemia, unspecified: Secondary | ICD-10-CM

## 2020-08-16 DIAGNOSIS — C7800 Secondary malignant neoplasm of unspecified lung: Secondary | ICD-10-CM

## 2020-08-16 DIAGNOSIS — C259 Malignant neoplasm of pancreas, unspecified: Secondary | ICD-10-CM

## 2020-08-16 DIAGNOSIS — J449 Chronic obstructive pulmonary disease, unspecified: Secondary | ICD-10-CM

## 2020-08-16 DIAGNOSIS — G4733 Obstructive sleep apnea (adult) (pediatric): Secondary | ICD-10-CM

## 2020-08-16 DIAGNOSIS — C785 Secondary malignant neoplasm of large intestine and rectum: Secondary | ICD-10-CM

## 2020-08-16 DIAGNOSIS — J189 Pneumonia, unspecified organism: Secondary | ICD-10-CM

## 2020-08-16 DIAGNOSIS — J9601 Acute respiratory failure with hypoxia: Secondary | ICD-10-CM | POA: Diagnosis not present

## 2020-08-16 DIAGNOSIS — D696 Thrombocytopenia, unspecified: Secondary | ICD-10-CM

## 2020-08-16 DIAGNOSIS — F32A Depression, unspecified: Secondary | ICD-10-CM

## 2020-08-16 LAB — CBC WITH DIFFERENTIAL/PLATELET
Abs Immature Granulocytes: 0.03 10*3/uL (ref 0.00–0.07)
Basophils Absolute: 0 10*3/uL (ref 0.0–0.1)
Basophils Relative: 0 %
Eosinophils Absolute: 0 10*3/uL (ref 0.0–0.5)
Eosinophils Relative: 0 %
HCT: 31.5 % — ABNORMAL LOW (ref 39.0–52.0)
Hemoglobin: 9.7 g/dL — ABNORMAL LOW (ref 13.0–17.0)
Immature Granulocytes: 0 %
Lymphocytes Relative: 9 %
Lymphs Abs: 0.7 10*3/uL (ref 0.7–4.0)
MCH: 25.9 pg — ABNORMAL LOW (ref 26.0–34.0)
MCHC: 30.8 g/dL (ref 30.0–36.0)
MCV: 84.2 fL (ref 80.0–100.0)
Monocytes Absolute: 0.3 10*3/uL (ref 0.1–1.0)
Monocytes Relative: 4 %
Neutro Abs: 7.1 10*3/uL (ref 1.7–7.7)
Neutrophils Relative %: 87 %
Platelets: 478 10*3/uL — ABNORMAL HIGH (ref 150–400)
RBC: 3.74 MIL/uL — ABNORMAL LOW (ref 4.22–5.81)
RDW: 16.3 % — ABNORMAL HIGH (ref 11.5–15.5)
WBC: 8.2 10*3/uL (ref 4.0–10.5)
nRBC: 0 % (ref 0.0–0.2)

## 2020-08-16 LAB — BASIC METABOLIC PANEL
Anion gap: 8 (ref 5–15)
BUN: 15 mg/dL (ref 8–23)
CO2: 25 mmol/L (ref 22–32)
Calcium: 9.1 mg/dL (ref 8.9–10.3)
Chloride: 103 mmol/L (ref 98–111)
Creatinine, Ser: 0.82 mg/dL (ref 0.61–1.24)
GFR, Estimated: >60 mL/min (ref >60)
Glucose, Bld: 168 mg/dL — ABNORMAL HIGH (ref 70–99)
Potassium: 4.5 mmol/L (ref 3.5–5.1)
Sodium: 136 mmol/L (ref 135–145)

## 2020-08-16 LAB — SURGICAL PATHOLOGY

## 2020-08-16 MED ORDER — IPRATROPIUM-ALBUTEROL 0.5-2.5 (3) MG/3ML IN SOLN
3.0000 mL | Freq: Three times a day (TID) | RESPIRATORY_TRACT | Status: DC
  Administered 2020-08-16 – 2020-08-22 (×18): 3 mL via RESPIRATORY_TRACT
  Filled 2020-08-16 (×18): qty 3

## 2020-08-16 MED ORDER — DRONABINOL 2.5 MG PO CAPS
2.5000 mg | ORAL_CAPSULE | Freq: Two times a day (BID) | ORAL | Status: DC
  Administered 2020-08-17 – 2020-08-22 (×11): 2.5 mg via ORAL
  Filled 2020-08-16 (×11): qty 1

## 2020-08-16 NOTE — Progress Notes (Signed)
   08/15/20 2300  Charting Type  Charting Type Reassessment  No changes in pt assessment since 2000.  Will continue to monitor and note changes in patient condition.

## 2020-08-16 NOTE — Consult Note (Addendum)
[place]  [phone] [phone]   MEDICAL ONCOLOGY - INITIAL CONSULTATION  Referral MD: Dr. 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 Grant Taylor is a 75 year old male with a past medical history significant for hypertension, COPD, hyperlipidemia.  Presented to the emergency department after becoming acutely short of breath.  Of note, the patient was diagnosed with a pancreatic mass on 07/26/2020 at the [facility] in [place].  He had a CT scan of the chest performed at their facility on 07/26/2020 which showed a very large pancreatic mass that occupied the pancreatic body and tail extends into the adjacent left upper quadrant organs with suspected innumerable large pulmonary metastasis.  He also had bulky nodal metastasis in the left supraclavicular region, mediastinum, and pulmonary hilar regions.  He also had scattered hepatic metastasis.  He was seen by Dr. Carlean Jews on 08/07/2020 and according to his note, the patient was referred for urgent fine-needle aspiration of his supraclavicular lymph node, liquid foundation 1 was obtained on that date, CT abdomen/pelvis was ordered, and there was a tentative plan to initiate palliative chemotherapy with gemcitabine/Abraxane pending tissue confirmation.  The CT of the abdomen/pelvis was performed at our facility on admission on 08/09/2020 which showed a large mass involving the pancreatic tail measuring up to 10 cm with extension into the left retroperitoneum with possible involvement of the left kidney, mass extends anterior to the spleen, retroperitoneal lymphadenopathy, low-density lesions in the liver which are likely metastases, innumerable masses in the pulmonary bases compatible with metastases, small pleural effusion.  He underwent the biopsy of the supraclavicular lymph node at our facility on 08/12/2020 which was consistent with metastatic  poorly differentiated pancreatic carcinoma with neuroendocrine features.  I met with the patient in his hospital room.  No family at bedside.  The patient tells me that he has had a poor appetite and a 30-35 pound weight loss in the past 3 to 4 months.  The patient noticed an enlarging nodule in his left supraclavicular area which is what prompted him to be seen at the same day walk-in clinic at the [facility].  He denies headaches, dizziness, chest pain, shortness of breath.  He has noticed a cough with intermittent hemoptysis.  He denies abdominal pain, nausea, vomiting, abdominal distention.  He reports mild constipation.  No bleeding reported.  The patient is divorced.  He has 3 adult children.  His children do not live locally but his daughter from [place] has come here to be with him for now.  Denies alcohol use.  Has a 15 to 20-year pack history of cigarette use.  Denies family history of GI malignancy.  The patient served in the [employer] and had one tour of duty in [place].  He reports a known exposure to agent orange.  Medical oncology was asked see the patient made recommendations regarding his newly diagnosed pancreatic cancer.  Past Medical History:  Diagnosis Date   Cancer ([place])    Chronic GERD    Constipation    ED (erectile dysfunction)    History of BPH    Hyperlipemia    Hypertension    Hypokalemia    OAB (overactive bladder)    Obstructive sleep apnea    Positive PPD    Vitamin B 12 deficiency   :   Past Surgical History:  Procedure Laterality Date   LAMINECTOMY AND MICRODISCECTOMY LUMBAR SPINE  2007  :   Current Facility-Administered Medications  Medication Dose Route Frequency Provider Last Rate Last Admin   0.9 %  sodium chloride infusion   Intravenous PRN Eulogio Bear U, DO   Stopped at 08/11/20 1341   acetaminophen (TYLENOL) tablet 650 mg  650 mg Oral Q6H PRN Rise Patience, MD   650 mg at 08/12/20 0944   Or   acetaminophen (TYLENOL) suppository 650 mg  650 mg  Rectal Q6H PRN Rise Patience, MD       amLODipine (NORVASC) tablet 10 mg  10 mg Oral Daily Rise Patience, MD   10 mg at 08/16/20 0858   buPROPion (WELLBUTRIN XL) 24 hr tablet 300 mg  300 mg Oral Daily Rise Patience, MD   300 mg at 08/16/20 0858   cefTRIAXone (ROCEPHIN) 1 g in sodium chloride 0.9 % 100 mL IVPB  1 g Intravenous Q24H Tawni Millers, MD   Stopped at 08/15/20 2057   chlorhexidine (PERIDEX) 0.12 % solution 15 mL  15 mL Mouth Rinse BID Vann, Jessica U, DO   15 mL at 08/16/20 5638   chlorpheniramine-HYDROcodone (TUSSIONEX) 10-8 MG/5ML suspension 5 mL  5 mL Oral Q12H Arrien, Jimmy Picket, MD   5 mL at 08/16/20 0855   cholecalciferol (VITAMIN D) tablet 2,000 Units  2,000 Units Oral Daily Darliss Cheney, MD   2,000 Units at 08/16/20 0858   cyclobenzaprine (FLEXERIL) tablet 10 mg  10 mg Oral TID PRN Darliss Cheney, MD   10 mg at 08/15/20 1729   docusate sodium (COLACE) capsule 100 mg  100 mg Oral Daily PRN Rise Patience, MD   100 mg at 08/12/20 1022   famotidine (PEPCID) tablet 20 mg  20 mg Oral Daily Rise Patience, MD   20 mg at 08/16/20 0857   feeding supplement (ENSURE ENLIVE / ENSURE PLUS) liquid 237 mL  237 mL Oral Daily Rise Patience, MD   237 mL at 08/16/20 0908   gabapentin (NEURONTIN) capsule 300 mg  300 mg Oral TID Rise Patience, MD   300 mg at 08/16/20 9373   guaiFENesin tablet 400 mg  400 mg Oral Daily PRN Darliss Cheney, MD   400 mg at 08/10/20 1531   guaiFENesin-dextromethorphan (ROBITUSSIN DM) 100-10 MG/5ML syrup 5 mL  5 mL Oral Q4H PRN Eulogio Bear U, DO   5 mL at 08/15/20 2023   HYDROmorphone (DILAUDID) injection 0.5 mg  0.5 mg Intravenous Q4H PRN Arrien, Jimmy Picket, MD   0.5 mg at 08/15/20 2153   hydrOXYzine (ATARAX/VISTARIL) tablet 10 mg  10 mg Oral Daily PRN Rise Patience, MD       ipratropium-albuterol (DUONEB) 0.5-2.5 (3) MG/3ML nebulizer solution 3 mL  3 mL Nebulization Q2H PRN Arrien, Jimmy Picket,  MD       ipratropium-albuterol (DUONEB) 0.5-2.5 (3) MG/3ML nebulizer solution 3 mL  3 mL Nebulization TID Shawna Clamp, MD       loratadine (CLARITIN) tablet 10 mg  10 mg Oral Daily Darliss Cheney, MD   10 mg at 08/16/20 0857   losartan (COZAAR) tablet 25 mg  25 mg Oral Daily Rise Patience, MD   25 mg at 08/16/20 0858   MEDLINE mouth rinse  15 mL Mouth Rinse q12n4p Eulogio Bear U, DO   15 mL at 08/15/20 1730   methylPREDNISolone sodium succinate (SOLU-MEDROL) 40 mg/mL injection 40 mg  40 mg Intravenous Q24H Tawni Millers, MD   40 mg at 08/15/20 1729   ondansetron (ZOFRAN) injection 4 mg  4  mg Intravenous Q6H PRN Arrien, Jimmy Picket, MD   4 mg at 08/16/20 1165   oxyCODONE (Oxy IR/ROXICODONE) immediate release tablet 5 mg  5 mg Oral Q4H PRN Arrien, Jimmy Picket, MD   5 mg at 08/16/20 0857   pantoprazole (PROTONIX) EC tablet 40 mg  40 mg Oral Daily Rise Patience, MD   40 mg at 08/16/20 0857   polyethylene glycol (MIRALAX / GLYCOLAX) packet 17 g  17 g Oral Daily Rise Patience, MD   17 g at 08/16/20 7903   pravastatin (PRAVACHOL) tablet 80 mg  80 mg Oral Daily Rise Patience, MD   80 mg at 08/16/20 0857   tiZANidine (ZANAFLEX) tablet 4 mg  4 mg Oral TID PRN Rise Patience, MD   4 mg at 08/15/20 1205      Allergies  Allergen Reactions   Levofloxacin Rash    Other reaction(s): Angioedema   Lisinopril Cough    Other reaction(s): Angioedema   Diclofenac     Other reaction(s): Lip swelling   Dust Mite Extract Nausea And Vomiting   Elemental Sulfur    Etodolac Nausea And Vomiting    Other reaction(s): Facial swelling   No Known Allergies     Other reaction(s): ANGIOEDEMA OF LIPS, Angioedema of tongue, ANGIOEDEMA OF LIPS, Angioedema of tongue   Tolterodine    Citalopram Other (See Comments)    Other reaction(s): Drowsy   Other Rash  :  History reviewed. No pertinent family history.:   Social History   Socioeconomic History   Marital  status: Married    Spouse name: Not on file   Number of children: Not on file   Years of education: Not on file   Highest education level: Not on file  Occupational History   Not on file  Tobacco Use   Smoking status: Former   Smokeless tobacco: Never  Vaping Use   Vaping Use: Former  Substance and Sexual Activity   Alcohol use: Not Currently   Drug use: Not Currently   Sexual activity: Yes  Other Topics Concern   Not on file  Social History Narrative   Not on file   Social Determinants of Health   Financial Resource Strain: Not on file  Food Insecurity: Not on file  Transportation Needs: Not on file  Physical Activity: Not on file  Stress: Not on file  Social Connections: Not on file  Intimate Partner Violence: Not on file  :  Review of Systems: A comprehensive 14 point review of systems was negative except as noted in the HPI.  Exam: Patient Vitals for the past 24 hrs:  BP Temp Temp src Pulse Resp SpO2  08/16/20 0813 -- -- -- -- -- 93 %  08/16/20 0623 (!) 147/91 98.1 F (36.7 C) Oral (!) 101 20 92 %  08/15/20 2232 138/89 98.2 F (36.8 C) Oral (!) 107 -- 92 %  08/15/20 1939 -- -- -- -- -- (!) 89 %  08/15/20 1550 -- -- -- -- -- 93 %  08/15/20 1217 136/88 98.2 F (36.8 C) Oral (!) 118 20 95 %  08/15/20 1152 -- -- -- -- -- 95 %    General:  well-nourished in no acute distress.   Eyes:  no scleral icterus.   ENT:  There were no oropharyngeal lesions.   Lymphatics: Enlarged left supraclavicular lymph node. Respiratory: lungs were clear bilaterally without wheezing or crackles.   Cardiovascular:  Regular rate and rhythm, S1/S2, without murmur, rub or  gallop.  There was no pedal edema.   GI:  abdomen was soft, flat, nontender, nondistended, without organomegaly.   Musculoskeletal: Strength symmetrical in the upper and lower extremities. Skin exam was without echymosis, petichae.   Neuro exam was nonfocal. Patient was alert and oriented.  Attention was good.    Language was appropriate.  Mood was normal without depression.  Speech was not pressured.  Thought content was not tangential.     Lab Results  Component Value Date   WBC 8.2 08/16/2020   HGB 9.7 (L) 08/16/2020   HCT 31.5 (L) 08/16/2020   PLT 478 (H) 08/16/2020   GLUCOSE 168 (H) 08/16/2020   ALT 65 (H) 08/09/2020   AST 99 (H) 08/09/2020   NA 136 08/16/2020   K 4.5 08/16/2020   CL 103 08/16/2020   CREATININE 0.82 08/16/2020   BUN 15 08/16/2020   CO2 25 08/16/2020    DG Chest 2 View  Result Date: 08/13/2020 CLINICAL DATA:  Acute shortness of breath EXAM: CHEST - 2 VIEW COMPARISON:  08/09/2020 FINDINGS: Cardiac shadow is stable. Lungs again demonstrate multiple bilateral nodular densities similar to that seen on the prior exam. Some superimposed acute infiltrate is noted particularly in the right base. Small effusions are noted bilaterally. No bony abnormality is seen. IMPRESSION: Stable metastatic disease bilaterally. New superimposed right basilar infiltrate Electronically Signed   By: Inez Catalina M.D.   On: 08/13/2020 12:53   CT Angio Chest PE W/Cm &/Or Wo Cm  Result Date: 08/09/2020 CLINICAL DATA:  Shortness of breath.  Pancreatic mass. EXAM: CT ANGIOGRAPHY CHEST WITH CONTRAST TECHNIQUE: Multidetector CT imaging of the chest was performed using the standard protocol during bolus administration of intravenous contrast. Multiplanar CT image reconstructions and MIPs were obtained to evaluate the vascular anatomy. CONTRAST:  173mL OMNIPAQUE IOHEXOL 350 MG/ML SOLN COMPARISON:  None. FINDINGS: Cardiovascular: No filling defects in the pulmonary arteries to suggest pulmonary emboli. Heart is normal size. Aorta is normal caliber. Scattered aortic calcifications. Mediastinum/Nodes: Mediastinal and bilateral hilar adenopathy. No axillary adenopathy. Index prevascular lymph node has a short axis diameter of 2.7 cm. Lungs/Pleura: Extensive innumerable bilateral pulmonary nodules and masses. Index  right lower lobe mass on image 111 measures 2.6 cm. Index left lower lobe mass on image 86 measures 2.5 cm. Small left pleural effusion. Upper Abdomen: Probable peritoneal implants in the left upper lobe anterior to the spleen. See abdominal CT report for further discussion. Musculoskeletal: Chest wall soft tissues are unremarkable. No acute bony abnormality. Review of the MIP images confirms the above findings. IMPRESSION: No evidence of pulmonary embolus. Innumerable bilateral pulmonary nodules and masses compatible metastases. Bulky mediastinal and bilateral hilar adenopathy. Small left pleural effusion. Electronically Signed   By: Rolm Baptise M.D.   On: 08/09/2020 08:55   CT Abdomen Pelvis W Contrast  Result Date: 08/09/2020 CLINICAL DATA:  Metastatic disease evaluation.  Pancreatic tumor. EXAM: CT ABDOMEN AND PELVIS WITH CONTRAST TECHNIQUE: Multidetector CT imaging of the abdomen and pelvis was performed using the standard protocol following bolus administration of intravenous contrast. CONTRAST:  168mL OMNIPAQUE IOHEXOL 350 MG/ML SOLN COMPARISON:  Chest CT today.  CT abdomen and pelvis 12/27/2019 FINDINGS: Lower chest: Innumerable masses in the lung bases. Small left pleural effusion. Hepatobiliary: Scattered low-density lesions throughout the liver which do not appear to be cystic and are concerning for metastases. Gallbladder unremarkable. Pancreas: Large mass in the pancreatic tail measures up to approximately 10 cm with extension anterior to the spleen and possible involvement  of the anterior upper pole of the left kidney. Spleen: No focal abnormality.  Normal size. Adrenals/Urinary Tract: Tumor from the pancreatic tail appears to extend to and possibly involve the anterior to mid pole of the left kidney. No hydronephrosis. Right adrenal gland unremarkable. Left adrenal gland not visualized and likely encased by the pancreatic tumor. Urinary bladder unremarkable. Stomach/Bowel: Pancreatic tumor abuts  the proximal descending thoracic aorta at the splenic flexure. No evidence of bowel obstruction. Vascular/Lymphatic: Aortic atherosclerosis. Large left periaortic lymph node with a short axis diameter of 3.3 cm. Other enlarged upper abdominal retroperitoneal lymph nodes. Reproductive: No visible focal abnormality. Other: Small amount of free fluid in the pelvis. Musculoskeletal: No acute bony abnormality. IMPRESSION: Large mass involving the pancreatic tail measuring up to 10 cm with extension in the left retroperitoneum with possible involvement of the left kidney. Mass extends anterior to the spleen. Retroperitoneal adenopathy. Low-density lesions in the liver, likely metastases. Innumerable masses in the pulmonary bases compatible with metastases. Small left pleural effusion. Small amount of free fluid in the pelvis. Aortic atherosclerosis. Electronically Signed   By: Rolm Baptise M.D.   On: 08/09/2020 08:59   DG Chest Portable 1 View  Result Date: 08/09/2020 CLINICAL DATA:  75 year old male with shortness of breath. History of pancreatic tumor. EXAM: PORTABLE CHEST 1 VIEW COMPARISON:  No priors. FINDINGS: Widespread areas of interstitial prominence are noted throughout the lungs bilaterally, most evident in the mid to lower lungs, strongly suggestive of interstitial lung disease. Lung volumes are normal. In addition, there are extensive areas of nodularity throughout the lungs bilaterally also most evident throughout the mid to lower lungs, concerning for widespread metastatic disease. Small left pleural effusion. No definite right pleural effusion. No pneumothorax. No evidence of pulmonary edema. Heart size is normal. Upper mediastinal contours are within normal limits. Orthopedic fixation hardware in the lower cervical spine incompletely imaged. IMPRESSION: 1. The appearance the chest suggest widespread metastatic disease to the lungs, superimposed upon a background of chronic interstitial lung disease, as  above. 2. Small left pleural effusion. Electronically Signed   By: Vinnie Langton M.D.   On: 08/09/2020 07:16   ECHOCARDIOGRAM COMPLETE  Result Date: 08/09/2020    ECHOCARDIOGRAM REPORT   Patient Name:   Grant Taylor Date of Exam: 08/09/2020 Medical Rec #:  [id number]                Height:       67.0 in Accession #:    [id number]               Weight:       140.7 lb Date of Birth:  Dec 04, 1945                BSA:          1.741 m Patient Age:    21 years                 BP:           136/96 mmHg Patient Gender: M                        HR:           111 bpm. Exam Location:  Inpatient Procedure: 2D Echo, Cardiac Doppler and Color Doppler Indications:    Dyspnea  History:        Patient has no prior history of Echocardiogram examinations.  Sonographer:    Merrie Roof  RDCS Referring Phys: [facility]  1. Left ventricular ejection fraction, by estimation, is 55 to 60%. The left ventricle has normal function. The left ventricle has no regional wall motion abnormalities. Left ventricular diastolic parameters are consistent with Grade I diastolic dysfunction (impaired relaxation).  2. Right ventricular systolic function is normal. The right ventricular size is normal. There is normal pulmonary artery systolic pressure.  3. The mitral valve is normal in structure. Trivial mitral valve regurgitation. No evidence of mitral stenosis.  4. The aortic valve is tricuspid. There is mild calcification of the aortic valve. There is mild thickening of the aortic valve. Aortic valve regurgitation is not visualized. Mild to moderate aortic valve sclerosis/calcification is present, without any evidence of aortic stenosis.  5. The inferior vena cava is normal in size with greater than 50% respiratory variability, suggesting right atrial pressure of 3 mmHg. Comparison(s): No prior Echocardiogram. FINDINGS  Left Ventricle: Left ventricular ejection fraction, by estimation, is 55 to 60%. The left  ventricle has normal function. The left ventricle has no regional wall motion abnormalities. The left ventricular internal cavity size was normal in size. There is  borderline concentric left ventricular hypertrophy. Left ventricular diastolic parameters are consistent with Grade I diastolic dysfunction (impaired relaxation). Normal left ventricular filling pressure. Right Ventricle: The right ventricular size is normal. No increase in right ventricular wall thickness. Right ventricular systolic function is normal. There is normal pulmonary artery systolic pressure. The tricuspid regurgitant velocity is 2.80 m/s, and  with an assumed right atrial pressure of 3 mmHg, the estimated right ventricular systolic pressure is 40.1 mmHg. Left Atrium: Left atrial size was normal in size. Right Atrium: Right atrial size was normal in size. Pericardium: There is no evidence of pericardial effusion. Mitral Valve: The mitral valve is normal in structure. There is mild thickening of the mitral valve leaflet(s). There is mild calcification of the mitral valve leaflet(s). Mild mitral annular calcification. Trivial mitral valve regurgitation. No evidence  of mitral valve stenosis. Tricuspid Valve: The tricuspid valve is normal in structure. Tricuspid valve regurgitation is trivial. Aortic Valve: The aortic valve is tricuspid. There is mild calcification of the aortic valve. There is mild thickening of the aortic valve. Aortic valve regurgitation is not visualized. Mild to moderate aortic valve sclerosis/calcification is present, without any evidence of aortic stenosis. Pulmonic Valve: The pulmonic valve was normal in structure. Pulmonic valve regurgitation is trivial. Aorta: The aortic root and ascending aorta are structurally normal, with no evidence of dilitation. Venous: The inferior vena cava is normal in size with greater than 50% respiratory variability, suggesting right atrial pressure of 3 mmHg. IAS/Shunts: No atrial level  shunt detected by color flow Doppler.  LEFT VENTRICLE PLAX 2D LVIDd:         2.97 cm  Diastology LVIDs:         2.18 cm  LV e' medial:  8.05 cm/s LV PW:         1.06 cm  LV e' lateral: 8.59 cm/s LV IVS:        0.87 cm LVOT diam:     1.90 cm LV SV:         66 LV SV Index:   38 LVOT Area:     2.84 cm  RIGHT VENTRICLE RV Basal diam:  3.60 cm LEFT ATRIUM             Index       RIGHT ATRIUM  Index LA diam:        2.80 cm 1.61 cm/m  RA Area:     16.40 cm LA Vol (A2C):   49.4 ml 28.37 ml/m RA Volume:   37.50 ml  21.54 ml/m LA Vol (A4C):   57.5 ml 33.02 ml/m LA Biplane Vol: 53.2 ml 30.55 ml/m  AORTIC VALVE LVOT Vmax:   154.00 cm/s LVOT Vmean:  113.000 cm/s LVOT VTI:    0.232 m  AORTA Ao Root diam: 3.10 cm Ao Asc diam:  3.50 cm TRICUSPID VALVE TR Peak grad:   31.4 mmHg TR Vmax:        280.00 cm/s  SHUNTS Systemic VTI:  0.23 m Systemic Diam: 1.90 cm Gwyndolyn Kaufman MD Electronically signed by Gwyndolyn Kaufman MD Signature Date/Time: 08/09/2020/5:00:32 PM    Final    [place] CORE BIOPSY (LYMPH NODES)  Result Date: 08/13/2020 INDICATION: Pancreatic mass, liver lesions, pulmonary masses and lymphadenopathy including palpable enlarged left supraclavicular lymph node. EXAM: ULTRASOUND GUIDED CORE BIOPSY OF LEFT SUPRACLAVICULAR LYMPH NODE MEDICATIONS: None. ANESTHESIA/SEDATION: Fentanyl 50 mcg IV; Versed 1.0 mg IV Moderate Sedation Time:  10 minutes. The patient was continuously monitored during the procedure by the interventional radiology nurse under my direct supervision. PROCEDURE: The procedure, risks, benefits, and alternatives were explained to the patient. Questions regarding the procedure were encouraged and answered. The patient understands and consents to the procedure. A time-out was performed prior to initiating the procedure. The left neck was prepped with chlorhexidine in a sterile fashion, and a sterile drape was applied covering the operative field. A sterile gown and sterile gloves were used for  the procedure. Local anesthesia was provided with 1% Lidocaine. After localizing abnormal enlarged left lower neck/supraclavicular lymph nodes, core biopsy samples were obtained with an 18 gauge core biopsy device. Four samples were obtained and submitted in formalin. COMPLICATIONS: None immediate. FINDINGS: Enlarged lymph node mass in the left supraclavicular region measures at least 5.2 cm in greatest diameter. Solid tissue was obtained. IMPRESSION: Ultrasound-guided core biopsy performed of enlarged lymph node mass in the left supraclavicular region. Electronically Signed   By: Aletta Edouard M.D.   On: 08/13/2020 09:19     DG Chest 2 View  Result Date: 08/13/2020 CLINICAL DATA:  Acute shortness of breath EXAM: CHEST - 2 VIEW COMPARISON:  08/09/2020 FINDINGS: Cardiac shadow is stable. Lungs again demonstrate multiple bilateral nodular densities similar to that seen on the prior exam. Some superimposed acute infiltrate is noted particularly in the right base. Small effusions are noted bilaterally. No bony abnormality is seen. IMPRESSION: Stable metastatic disease bilaterally. New superimposed right basilar infiltrate Electronically Signed   By: Inez Catalina M.D.   On: 08/13/2020 12:53   CT Angio Chest PE W/Cm &/Or Wo Cm  Result Date: 08/09/2020 CLINICAL DATA:  Shortness of breath.  Pancreatic mass. EXAM: CT ANGIOGRAPHY CHEST WITH CONTRAST TECHNIQUE: Multidetector CT imaging of the chest was performed using the standard protocol during bolus administration of intravenous contrast. Multiplanar CT image reconstructions and MIPs were obtained to evaluate the vascular anatomy. CONTRAST:  166mL OMNIPAQUE IOHEXOL 350 MG/ML SOLN COMPARISON:  None. FINDINGS: Cardiovascular: No filling defects in the pulmonary arteries to suggest pulmonary emboli. Heart is normal size. Aorta is normal caliber. Scattered aortic calcifications. Mediastinum/Nodes: Mediastinal and bilateral hilar adenopathy. No axillary adenopathy.  Index prevascular lymph node has a short axis diameter of 2.7 cm. Lungs/Pleura: Extensive innumerable bilateral pulmonary nodules and masses. Index right lower lobe mass on image 111 measures 2.6 cm.  Index left lower lobe mass on image 86 measures 2.5 cm. Small left pleural effusion. Upper Abdomen: Probable peritoneal implants in the left upper lobe anterior to the spleen. See abdominal CT report for further discussion. Musculoskeletal: Chest wall soft tissues are unremarkable. No acute bony abnormality. Review of the MIP images confirms the above findings. IMPRESSION: No evidence of pulmonary embolus. Innumerable bilateral pulmonary nodules and masses compatible metastases. Bulky mediastinal and bilateral hilar adenopathy. Small left pleural effusion. Electronically Signed   By: Rolm Baptise M.D.   On: 08/09/2020 08:55   CT Abdomen Pelvis W Contrast  Result Date: 08/09/2020 CLINICAL DATA:  Metastatic disease evaluation.  Pancreatic tumor. EXAM: CT ABDOMEN AND PELVIS WITH CONTRAST TECHNIQUE: Multidetector CT imaging of the abdomen and pelvis was performed using the standard protocol following bolus administration of intravenous contrast. CONTRAST:  18mL OMNIPAQUE IOHEXOL 350 MG/ML SOLN COMPARISON:  Chest CT today.  CT abdomen and pelvis 12/27/2019 FINDINGS: Lower chest: Innumerable masses in the lung bases. Small left pleural effusion. Hepatobiliary: Scattered low-density lesions throughout the liver which do not appear to be cystic and are concerning for metastases. Gallbladder unremarkable. Pancreas: Large mass in the pancreatic tail measures up to approximately 10 cm with extension anterior to the spleen and possible involvement of the anterior upper pole of the left kidney. Spleen: No focal abnormality.  Normal size. Adrenals/Urinary Tract: Tumor from the pancreatic tail appears to extend to and possibly involve the anterior to mid pole of the left kidney. No hydronephrosis. Right adrenal gland  unremarkable. Left adrenal gland not visualized and likely encased by the pancreatic tumor. Urinary bladder unremarkable. Stomach/Bowel: Pancreatic tumor abuts the proximal descending thoracic aorta at the splenic flexure. No evidence of bowel obstruction. Vascular/Lymphatic: Aortic atherosclerosis. Large left periaortic lymph node with a short axis diameter of 3.3 cm. Other enlarged upper abdominal retroperitoneal lymph nodes. Reproductive: No visible focal abnormality. Other: Small amount of free fluid in the pelvis. Musculoskeletal: No acute bony abnormality. IMPRESSION: Large mass involving the pancreatic tail measuring up to 10 cm with extension in the left retroperitoneum with possible involvement of the left kidney. Mass extends anterior to the spleen. Retroperitoneal adenopathy. Low-density lesions in the liver, likely metastases. Innumerable masses in the pulmonary bases compatible with metastases. Small left pleural effusion. Small amount of free fluid in the pelvis. Aortic atherosclerosis. Electronically Signed   By: Rolm Baptise M.D.   On: 08/09/2020 08:59   DG Chest Portable 1 View  Result Date: 08/09/2020 CLINICAL DATA:  75 year old male with shortness of breath. History of pancreatic tumor. EXAM: PORTABLE CHEST 1 VIEW COMPARISON:  No priors. FINDINGS: Widespread areas of interstitial prominence are noted throughout the lungs bilaterally, most evident in the mid to lower lungs, strongly suggestive of interstitial lung disease. Lung volumes are normal. In addition, there are extensive areas of nodularity throughout the lungs bilaterally also most evident throughout the mid to lower lungs, concerning for widespread metastatic disease. Small left pleural effusion. No definite right pleural effusion. No pneumothorax. No evidence of pulmonary edema. Heart size is normal. Upper mediastinal contours are within normal limits. Orthopedic fixation hardware in the lower cervical spine incompletely imaged.  IMPRESSION: 1. The appearance the chest suggest widespread metastatic disease to the lungs, superimposed upon a background of chronic interstitial lung disease, as above. 2. Small left pleural effusion. Electronically Signed   By: Vinnie Langton M.D.   On: 08/09/2020 07:16   ECHOCARDIOGRAM COMPLETE  Result Date: 08/09/2020    ECHOCARDIOGRAM REPORT  Patient Name:   Grant Taylor [facility] Date of Exam: 08/09/2020 Medical Rec #:  [id number]                Height:       67.0 in Accession #:    [id number]               Weight:       140.7 lb Date of Birth:  Oct 30, 1945                BSA:          1.741 m Patient Age:    79 years                 BP:           136/96 mmHg Patient Gender: M                        HR:           111 bpm. Exam Location:  Inpatient Procedure: 2D Echo, Cardiac Doppler and Color Doppler Indications:    Dyspnea  History:        Patient has no prior history of Echocardiogram examinations.  Sonographer:    Merrie Roof RDCS Referring Phys: [place]  1. Left ventricular ejection fraction, by estimation, is 55 to 60%. The left ventricle has normal function. The left ventricle has no regional wall motion abnormalities. Left ventricular diastolic parameters are consistent with Grade I diastolic dysfunction (impaired relaxation).  2. Right ventricular systolic function is normal. The right ventricular size is normal. There is normal pulmonary artery systolic pressure.  3. The mitral valve is normal in structure. Trivial mitral valve regurgitation. No evidence of mitral stenosis.  4. The aortic valve is tricuspid. There is mild calcification of the aortic valve. There is mild thickening of the aortic valve. Aortic valve regurgitation is not visualized. Mild to moderate aortic valve sclerosis/calcification is present, without any evidence of aortic stenosis.  5. The inferior vena cava is normal in size with greater than 50% respiratory variability, suggesting right atrial  pressure of 3 mmHg. Comparison(s): No prior Echocardiogram. FINDINGS  Left Ventricle: Left ventricular ejection fraction, by estimation, is 55 to 60%. The left ventricle has normal function. The left ventricle has no regional wall motion abnormalities. The left ventricular internal cavity size was normal in size. There is  borderline concentric left ventricular hypertrophy. Left ventricular diastolic parameters are consistent with Grade I diastolic dysfunction (impaired relaxation). Normal left ventricular filling pressure. Right Ventricle: The right ventricular size is normal. No increase in right ventricular wall thickness. Right ventricular systolic function is normal. There is normal pulmonary artery systolic pressure. The tricuspid regurgitant velocity is 2.80 m/s, and  with an assumed right atrial pressure of 3 mmHg, the estimated right ventricular systolic pressure is 25.0 mmHg. Left Atrium: Left atrial size was normal in size. Right Atrium: Right atrial size was normal in size. Pericardium: There is no evidence of pericardial effusion. Mitral Valve: The mitral valve is normal in structure. There is mild thickening of the mitral valve leaflet(s). There is mild calcification of the mitral valve leaflet(s). Mild mitral annular calcification. Trivial mitral valve regurgitation. No evidence  of mitral valve stenosis. Tricuspid Valve: The tricuspid valve is normal in structure. Tricuspid valve regurgitation is trivial. Aortic Valve: The aortic valve is tricuspid. There is mild calcification of the aortic valve. There is mild thickening of the aortic valve.  Aortic valve regurgitation is not visualized. Mild to moderate aortic valve sclerosis/calcification is present, without any evidence of aortic stenosis. Pulmonic Valve: The pulmonic valve was normal in structure. Pulmonic valve regurgitation is trivial. Aorta: The aortic root and ascending aorta are structurally normal, with no evidence of dilitation. Venous:  The inferior vena cava is normal in size with greater than 50% respiratory variability, suggesting right atrial pressure of 3 mmHg. IAS/Shunts: No atrial level shunt detected by color flow Doppler.  LEFT VENTRICLE PLAX 2D LVIDd:         2.97 cm  Diastology LVIDs:         2.18 cm  LV e' medial:  8.05 cm/s LV PW:         1.06 cm  LV e' lateral: 8.59 cm/s LV IVS:        0.87 cm LVOT diam:     1.90 cm LV SV:         66 LV SV Index:   38 LVOT Area:     2.84 cm  RIGHT VENTRICLE RV Basal diam:  3.60 cm LEFT ATRIUM             Index       RIGHT ATRIUM           Index LA diam:        2.80 cm 1.61 cm/m  RA Area:     16.40 cm LA Vol (A2C):   49.4 ml 28.37 ml/m RA Volume:   37.50 ml  21.54 ml/m LA Vol (A4C):   57.5 ml 33.02 ml/m LA Biplane Vol: 53.2 ml 30.55 ml/m  AORTIC VALVE LVOT Vmax:   154.00 cm/s LVOT Vmean:  113.000 cm/s LVOT VTI:    0.232 m  AORTA Ao Root diam: 3.10 cm Ao Asc diam:  3.50 cm TRICUSPID VALVE TR Peak grad:   31.4 mmHg TR Vmax:        280.00 cm/s  SHUNTS Systemic VTI:  0.23 m Systemic Diam: 1.90 cm Gwyndolyn Kaufman MD Electronically signed by Gwyndolyn Kaufman MD Signature Date/Time: 08/09/2020/5:00:32 PM    Final    [place] CORE BIOPSY (LYMPH NODES)  Result Date: 08/13/2020 INDICATION: Pancreatic mass, liver lesions, pulmonary masses and lymphadenopathy including palpable enlarged left supraclavicular lymph node. EXAM: ULTRASOUND GUIDED CORE BIOPSY OF LEFT SUPRACLAVICULAR LYMPH NODE MEDICATIONS: None. ANESTHESIA/SEDATION: Fentanyl 50 mcg IV; Versed 1.0 mg IV Moderate Sedation Time:  10 minutes. The patient was continuously monitored during the procedure by the interventional radiology nurse under my direct supervision. PROCEDURE: The procedure, risks, benefits, and alternatives were explained to the patient. Questions regarding the procedure were encouraged and answered. The patient understands and consents to the procedure. A time-out was performed prior to initiating the procedure. The left neck was  prepped with chlorhexidine in a sterile fashion, and a sterile drape was applied covering the operative field. A sterile gown and sterile gloves were used for the procedure. Local anesthesia was provided with 1% Lidocaine. After localizing abnormal enlarged left lower neck/supraclavicular lymph nodes, core biopsy samples were obtained with an 18 gauge core biopsy device. Four samples were obtained and submitted in formalin. COMPLICATIONS: None immediate. FINDINGS: Enlarged lymph node mass in the left supraclavicular region measures at least 5.2 cm in greatest diameter. Solid tissue was obtained. IMPRESSION: Ultrasound-guided core biopsy performed of enlarged lymph node mass in the left supraclavicular region. Electronically Signed   By: Aletta Edouard M.D.   On: 08/13/2020 09:19    Pathology:  SURGICAL PATHOLOGY  CASE: [id number]  PATIENT: Grant Taylor  Surgical Pathology Report   Clinical History: Large pancreatic mass, liver lesions, lung masses,  diffuse lymphadenopathy; Probable metastatic pancreatic carcinoma ([facility])    FINAL MICROSCOPIC DIAGNOSIS:   A. 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  Overall, the immunophenotype is consistent with a poorly differentiated  carcinoma with neuroendocrine features.  Dr. Saralyn Pilar reviewed the case  and agrees with the above diagnosis.  Dr. Dwyane Dee was notified of these  results on August 16, 2020.   GROSS DESCRIPTION:   Received fresh are 6 cores of gray-white to pale yellow soft tissue  which range from 0.2 x 0.1 cm to 2.8 x 0.1 cm, and are divided among 2  blocks for routine histology.   SW 08/13/2020   Final Diagnosis  performed by Thressa Sheller, MD.   Electronically signed  08/16/2020   Assessment and Plan:  1.  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  Normocytic anemia 3.  Thrombocytosis, likely reactive 4.  Community-acquired pneumonia 5.  COPD 6.  Hypertension 7.  Hyperlipidemia 8.  Depression 9.  Obstructive sleep apnea  -I discussed the patient's CT scan finding as well as the biopsy results with the patient today.  We discussed the new diagnosis of pancreatic cancer with metastasis to multiple areas including the lungs and liver.  We discussed that his disease is not curable.  However, can consider palliative treatment with systemic chemotherapy.  Further recommendations per Dr. Marin Olp. -Recommend obtaining a CA 19.9 with next lab draw. -We will obtain additional work-up for his anemia with next lab draw including iron studies, ferritin, vitamin B12 level, and folate. -Management of chronic medical conditions per hospitalist.  Thank you for this referral.   Grant Bussing, DNP, AGPCNP-BC, AOCNP   ADDENDUM:   I saw and examined Mr. Cadieux.  He is incredibly nice.  He is a true [place] hero for serving our country.  He was in the [employer].  He was in [place].  There was [employer] exposure.  He also was a camp [place] and may have also had contaminated water exposure.  He obviously has stage IV metastatic pancreatic cancer.  He has disease in the left supraclavicular node.  This was biopsied.  He has lung mets.  He has liver mets.  He has adenopathy.  I think the issue is whether or not he can be treated in the community or if he needs to be treated by the [place].  He is seen at the [facility].  Were somehow going to have to get hold of his oncologist there.  From what I can tell, it sounds like that he has been set up for chemotherapy with Abraxane/Gemzar.  I will know this will be done in [place] or in [place].  He would like to be local  if possible.  His daughter was with him.  She lives in [place], [place].  Will be interesting to see what his CA 19-9 is.  I will also check his prealbumin.  I talked to them about his condition.  He has a noncurable condition.  We can certainly treat this.  However, we cannot cure this.  I think that Abraxane/Gemzar  would be a great choice for him.  He would need to have a Port-A-Cath to be placed.  I will start him on some Marinol (2.5 mg p.o. twice daily) to help with his nutritional intake.  I am not sure how much longer he will be in the hospital with his pneumonia.  He is on antibiotics.  He is at significant risk for thromboembolic disease.  I probably would consider him for some type of low-dose anticoagulation as an outpatient to try to minimize thromboembolic events.  I did not go over time frames with respect to his disease.  Again, he would like to be treated in the community if possible.  Again we will going to have to speak to the [place] about this.  I am not sure who we would have to contact regarding this.  Again, Mr. Delman is a true [place] hero.  He certainly deserves the best care possible.  Hopefully, we will be able to treat him locally.  Lattie Haw, MD  Lurena Joiner 1:37

## 2020-08-16 NOTE — Progress Notes (Signed)
PROGRESS NOTE    Grant Taylor  ZOX:[id number] DOB: 12/04/1945 DOA: 08/09/2020 PCP: [name], MD    Brief Narrative:  This 75 years old male with PMH significant for hypertension, COPD and dyslipidemia presented to the ED with worsening shortness of breath.  Patient was recently diagnosed with pancreatic mass on July 26, 2020 at [facility] in [place]. One night prior to the hospitalization he has developed significant cough associated with shortness of breath which was severe enough that prompted him to come to the ED.  Chest radiograph showed bilateral interstitial infiltrate lower lobes and upper lobes,  positive vascular congestion.  CT with multiple pulmonary nodules and masses compatible with metastasis.  Bulky mediastinal and bilateral hilar lymphadenopathy.  CT abdomen and pelvis shows large mass involving the pancreatic tail up to 10 cm extension into the left retroperitoneum and possible involvement of the left hip.  Patient underwent left supra clavicular lymph node biopsy.  Patient is started on bronchodilators,  supplemental oxygen and IV antibiotics. 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  Assessment & Plan:   Principal Problem:   Acute respiratory failure with hypoxia (HCC) Active Problems:   Essential hypertension   COPD (chronic obstructive pulmonary disease) (HCC)   Pancreatic mass   Severe sepsis (HCC)  Acute hypoxic respiratory failure could be multifactorial,  sec. to community-acquired pneumonia in the setting of metastatic lung disease and COPD exacerbation: Patient presented with persistent cough and worsening shortness of breath.   Chest x-ray shows bilateral interstitial infiltrates.  Strep pneumonia antigen negative.  Legionella pneumobilia negative. Patient was severely hypoxic on arrival requiring 5 L HFNC sats 95%. Continue supplemental oxygen to keep saturation above  95%. Continue ceftriaxone for 5 days. Weaned down to [employer] @ 4l/min Continue bronchodilator therapy and systemic steroids for COPD exacerbation. Echocardiogram shows LVEF  55- 60%.  No regional wall motion abnormalities. Encourage out of bed to chair and ambulate with physical therapy. Patient will likely need home 02 at discharge.     Metastatic pancreatic cancer: Patient presented with positive signs of metastatic disease.   Lymph node biopsy confirms the diagnosis of cancer with paraneoplastic features. Oncology consulted,  awaiting recommendation.  Continue adequate pain control with hydromorphone and oxycodone.   Hypertension: Continue losartan and amlodipine.  Hyperlipidemia: Continue pravastatin,   Depression:  continue bupropion.    DVT prophylaxis: Lovenox Code Status:Full code. Family Communication:Family at bed side. Disposition Plan:   Status is: Inpatient  Remains inpatient appropriate because:Inpatient level of care appropriate due to severity of illness  Dispo: The patient is from: Home              Anticipated d/c is to: Home              Patient currently is not medically stable to d/c.   Difficult to place patient No  Consultants:  Oncology  Procedures: CT chest, Abd pelvis Antimicrobials:   Anti-infectives (From admission, onward)    Start     Dose/Rate Route Frequency Ordered Stop   08/14/20 2000  cefTRIAXone (ROCEPHIN) 1 g in sodium chloride 0.9 % 100 mL IVPB        1 g 200 mL/hr over 30 Minutes Intravenous Every 24 hours 08/14/20 1741     08/12/20 1000  azithromycin (ZITHROMAX) tablet 500 mg        500 mg Oral Daily 08/11/20 1345 08/13/20 0854   08/09/20 1200  cefTRIAXone (ROCEPHIN) 2 g in sodium  chloride 0.9 % 100 mL IVPB        2 g 200 mL/hr over 30 Minutes Intravenous Every 24 hours 08/09/20 1142 08/13/20 1530   08/09/20 1200  azithromycin (ZITHROMAX) 500 mg in sodium chloride 0.9 % 250 mL IVPB  Status:  Discontinued        500 mg 250 mL/hr  over 60 Minutes Intravenous Daily 08/09/20 1142 08/11/20 1345        Subjective: Patient was seen and examined at bedside.  Overnight events noted.  Patient reports feeling little better,  he is weaned down to 4 L of supplemental oxygen.  He is saturating 94%.  He seems very deconditioned.  Objective: Vitals:   08/15/20 1939 08/15/20 2232 08/16/20 0623 08/16/20 0813  BP:  138/89 (!) 147/91   Pulse:  (!) 107 (!) 101   Resp:   20   Temp:  98.2 F (36.8 C) 98.1 F (36.7 C)   TempSrc:  Oral Oral   SpO2: (!) 89% 92% 92% 93%  Weight:      Height:        Intake/Output Summary (Last 24 hours) at 08/16/2020 1117 Last data filed at 08/16/2020 1448 Gross per 24 hour  Intake 100 ml  Output 1025 ml  Net -925 ml   Filed Weights   08/09/20 1536  Weight: 63.8 kg    Examination:  General exam: Appears calm and comfortable, deconditioned thin built. Respiratory system: Clear to auscultation. Respiratory effort normal. Cardiovascular system: S1 & S2 heard, RRR. No JVD, murmur++,  No rubs, gallops or clicks. No pedal edema. Gastrointestinal system: Abdomen is nondistended, soft and nontender. No organomegaly or masses felt. Normal bowel sounds heard. Central nervous system: Alert and oriented. No focal neurological deficits. Extremities:   No Edema, no cyanosis, no clubbing. Skin: No rashes, lesions or ulcers Psychiatry: Judgement and insight appear normal. Mood & affect appropriate.     Data Reviewed: I have personally reviewed following labs and imaging studies  CBC: Recent Labs  Lab 08/10/20 0330 08/11/20 0345 08/13/20 0340 08/15/20 0354 08/16/20 0334  WBC 14.5* 12.3* 10.9* 8.5 8.2  NEUTROABS  --  8.8*  --  7.4 7.1  HGB 9.8* 9.5* 9.2* 9.7* 9.7*  HCT 30.7* 30.2* 28.8* 31.3* 31.5*  MCV 83.0 83.0 83.0 83.2 84.2  PLT 309 339 354 441* 185*   Basic Metabolic Panel: Recent Labs  Lab 08/10/20 0330 08/11/20 0345 08/13/20 0340 08/15/20 0354 08/16/20 0334  NA 137 133* 137  138 136  K 4.0 4.0 3.7 4.5 4.5  CL 103 99 101 99 103  CO2 26 25 25 26 25   GLUCOSE 130* 116* 103* 155* 168*  BUN 12 13 11 13 15   CREATININE 1.02 1.11 0.95 0.94 0.82  CALCIUM 8.7* 8.6* 8.6* 9.4 9.1   GFR: Estimated Creatinine Clearance: 69.2 mL/min (by C-G formula based on SCr of 0.82 mg/dL). Liver Function Tests: No results for input(s): AST, ALT, ALKPHOS, BILITOT, PROT, ALBUMIN in the last 168 hours. No results for input(s): LIPASE, AMYLASE in the last 168 hours. No results for input(s): AMMONIA in the last 168 hours. Coagulation Profile: No results for input(s): INR, PROTIME in the last 168 hours. Cardiac Enzymes: No results for input(s): CKTOTAL, CKMB, CKMBINDEX, TROPONINI in the last 168 hours. BNP (last 3 results) No results for input(s): PROBNP in the last 8760 hours. HbA1C: No results for input(s): HGBA1C in the last 72 hours. CBG: No results for input(s): GLUCAP in the last 168 hours. Lipid Profile:  No results for input(s): CHOL, HDL, LDLCALC, TRIG, CHOLHDL, LDLDIRECT in the last 72 hours. Thyroid Function Tests: No results for input(s): TSH, T4TOTAL, FREET4, T3FREE, THYROIDAB in the last 72 hours. Anemia Panel: No results for input(s): VITAMINB12, FOLATE, FERRITIN, TIBC, IRON, RETICCTPCT in the last 72 hours. Sepsis Labs: Recent Labs  Lab 08/10/20 0753  PROCALCITON 0.33    Recent Results (from the past 240 hour(s))  Resp Panel by RT-PCR (Flu A&B, Covid) Nasopharyngeal Swab     Status: None   Collection Time: 08/09/20  6:51 AM   Specimen: Nasopharyngeal Swab; Nasopharyngeal(NP) swabs in vial transport medium  Result Value Ref Range Status   SARS Coronavirus 2 by RT PCR NEGATIVE NEGATIVE Final    Comment: (NOTE) SARS-CoV-2 target nucleic acids are NOT DETECTED.  The SARS-CoV-2 RNA is generally detectable in upper respiratory specimens during the acute phase of infection. The lowest concentration of SARS-CoV-2 viral copies this assay can detect is 138 copies/mL.  A negative result does not preclude SARS-Cov-2 infection and should not be used as the sole basis for treatment or other patient management decisions. A negative result may occur with  improper specimen collection/handling, submission of specimen other than nasopharyngeal swab, presence of viral mutation(s) within the areas targeted by this assay, and inadequate number of viral copies(<138 copies/mL). A negative result must be combined with clinical observations, patient history, and epidemiological information. The expected result is Negative.  Fact Sheet for Patients:  EntrepreneurPulse.com.au  Fact Sheet for Healthcare Providers:  IncredibleEmployment.be  This test is no t yet approved or cleared by the [place] FDA and  has been authorized for detection and/or diagnosis of SARS-CoV-2 by FDA under an Emergency Use Authorization (EUA). This EUA will remain  in effect (meaning this test can be used) for the duration of the COVID-19 declaration under Section 564(b)(1) of the Act, 21 U.S.C.section 360bbb-3(b)(1), unless the authorization is terminated  or revoked sooner.       Influenza A by PCR NEGATIVE NEGATIVE Final   Influenza B by PCR NEGATIVE NEGATIVE Final    Comment: (NOTE) The Xpert Xpress SARS-CoV-2/FLU/RSV plus assay is intended as an aid in the diagnosis of influenza from Nasopharyngeal swab specimens and should not be used as a sole basis for treatment. Nasal washings and aspirates are unacceptable for Xpert Xpress SARS-CoV-2/FLU/RSV testing.  Fact Sheet for Patients: EntrepreneurPulse.com.au  Fact Sheet for Healthcare Providers: IncredibleEmployment.be  This test is not yet approved or cleared by the [place] FDA and has been authorized for detection and/or diagnosis of SARS-CoV-2 by FDA under an Emergency Use Authorization (EUA). This EUA will remain in effect (meaning this test  can be used) for the duration of the COVID-19 declaration under Section 564(b)(1) of the Act, 21 U.S.C. section 360bbb-3(b)(1), unless the authorization is terminated or revoked.  Performed at [facility], [place] [address], [place], [place] [address]   Culture, blood (routine x 2) Call MD if unable to obtain prior to antibiotics being given     Status: None   Collection Time: 08/09/20 12:29 PM   Specimen: BLOOD  Result Value Ref Range Status   Specimen Description   Final    BLOOD BLOOD LEFT FOREARM Performed at [facility] [address], [place], [place] [address]    Special Requests   Final    BOTTLES DRAWN AEROBIC AND ANAEROBIC Blood Culture results may not be optimal due to an inadequate volume of blood received in culture bottles Performed at  [facility], [place] [address] [place], [place] [address]    Culture   Final    NO GROWTH 5 DAYS Performed at [facility], [place] [address], [place], [place] [address]    Report Status 08/14/2020 FINAL  Final  Culture, blood (routine x 2) Call MD if unable to obtain prior to antibiotics being given     Status: None   Collection Time: 08/09/20 12:30 PM   Specimen: BLOOD  Result Value Ref Range Status   Specimen Description   Final    BLOOD RIGHT ANTECUBITAL Performed at [facility] [address], [place], [place] [address]    Special Requests   Final    BOTTLES DRAWN AEROBIC AND ANAEROBIC Blood Culture results may not be optimal due to an inadequate volume of blood received in culture bottles Performed at [facility] [address], [place], [place] [address]    Culture   Final    NO GROWTH 5 DAYS Performed at [facility], [place] [address] [place], [place] [address]    Report Status 08/14/2020 FINAL  Final  Expectorated Sputum Assessment w Gram Stain, Rflx to Resp Cult     Status: None   Collection Time: 08/10/20  7:13 PM    Specimen: Expectorated Sputum  Result Value Ref Range Status   Specimen Description EXPECTORATED SPUTUM  Final   Special Requests NONE  Final   Sputum evaluation   Final    THIS SPECIMEN IS ACCEPTABLE FOR SPUTUM CULTURE Performed at [facility], [place] [address] [place], [place] [address]    Report Status 08/10/2020 FINAL  Final  Culture, Respiratory w Gram Stain     Status: None   Collection Time: 08/10/20  7:13 PM  Result Value Ref Range Status   Specimen Description   Final    EXPECTORATED SPUTUM Performed at [facility], [place] [address], [place], [place] [address]    Special Requests   Final    NONE Reflexed from [id number] Performed at [facility], [place] [address], [place], [place] [address]    Gram Stain   Final    RARE WBC PRESENT, PREDOMINANTLY PMN NO ORGANISMS SEEN    Culture   Final    RARE Normal respiratory flora-no Staph aureus or Pseudomonas seen Performed at [facility] [address], [place], [place] [address]    Report Status 08/13/2020 FINAL  Final    Radiology Studies: No results found.  Scheduled Meds:  amLODipine  10 mg Oral Daily   buPROPion  300 mg Oral Daily   chlorhexidine  15 mL Mouth Rinse BID   chlorpheniramine-HYDROcodone  5 mL Oral Q12H   cholecalciferol  2,000 Units Oral Daily   famotidine  20 mg Oral Daily   feeding supplement  237 mL Oral Daily   gabapentin  300 mg Oral TID   ipratropium-albuterol  3 mL Nebulization TID   loratadine  10 mg Oral Daily   losartan  25 mg Oral Daily   mouth rinse  15 mL Mouth Rinse q12n4p   methylPREDNISolone (SOLU-MEDROL) injection  40 mg Intravenous Q24H   pantoprazole  40 mg Oral Daily   polyethylene glycol  17 g Oral Daily   pravastatin  80 mg Oral Daily   Continuous Infusions:  sodium chloride Stopped (08/11/20 1341)   cefTRIAXone (ROCEPHIN)  IV Stopped (08/15/20 2057)     LOS: 6 days  Time spent: 35 mins    Shawna Clamp,  MD [facility]   If 7PM-7AM, please contact night-coverage

## 2020-08-16 NOTE — Progress Notes (Signed)
Physical Therapy Treatment Patient Details Name: Grant Taylor MRN: [id number] DOB: 02/15/1945 Today's Date: 08/16/2020    History of Present Illness Pt is 75 yo male admitted with sepsis, acute resp failure due to PNE and mets to lung on 08/09/20.  Pt with hx of metastatic pancreatic CA and COPD.    PT Comments    Pt assisted with ambulating in hallway.  Pt's Spo2 dropped to 86% on 4L O2 [place], and pt stopped for breathing and rest break.  Pt requested to try to remain on 4L today.  SpO2 improved to 90% to ambulate back to room.  Pt agreeable to remain OOB in recliner end of session.     Follow Up Recommendations  Home health PT;Supervision - Intermittent     Equipment Recommendations  None recommended by PT    Recommendations for Other Services       Precautions / Restrictions Precautions Precautions: Fall Precaution Comments: prefers shoes; monitor O2    Mobility  Bed Mobility Overal bed mobility: Modified Independent                  Transfers Overall transfer level: Needs assistance Equipment used: Lofstrands (right) Transfers: Sit to/from Stand Sit to Stand: Min guard         General transfer comment: min/guard for safety  Ambulation/Gait Ambulation/Gait assistance: Min guard Gait Distance (Feet): 90 Feet (x2) Assistive device: Lofstrands Gait Pattern/deviations: Decreased dorsiflexion - left;Step-through pattern Gait velocity: decreased   General Gait Details: uses lofstrand crutch on Right, decreased DF on left with slight circumduction, required one standing rest break, SPO2 86% on 4L O2 [place] however improved with breathing and rest break to 90%, Spo2 91% on 4L end of session   Stairs             Wheelchair Mobility    Modified Rankin (Stroke Patients Only)       Balance                                            Cognition Arousal/Alertness: Awake/alert Behavior During Therapy: WFL for tasks  assessed/performed Overall Cognitive Status: Within Functional Limits for tasks assessed                                        Exercises      General Comments        Pertinent Vitals/Pain Pain Assessment: No/denies pain    Home Living                      Prior Function            PT Goals (current goals can now be found in the care plan section) Progress towards PT goals: Progressing toward goals    Frequency    Min 3X/week      PT Plan Current plan remains appropriate    Co-evaluation              AM-PAC PT "6 Clicks" Mobility   Outcome Measure  Help needed turning from your back to your side while in a flat bed without using bedrails?: None Help needed moving from lying on your back to sitting on the side of a flat bed without using bedrails?: None Help needed moving to  and from a bed to a chair (including a wheelchair)?: A Little Help needed standing up from a chair using your arms (e.g., wheelchair or bedside chair)?: A Little Help needed to walk in hospital room?: A Little Help needed climbing 3-5 steps with a railing? : A Lot 6 Click Score: 19    End of Session Equipment Utilized During Treatment: Gait belt;Oxygen Activity Tolerance: Patient tolerated treatment well Patient left: in chair;with call bell/phone within reach;with chair alarm set Nurse Communication: Mobility status PT Visit Diagnosis: Difficulty in walking, not elsewhere classified (R26.2);Muscle weakness (generalized) (M62.81);Unsteadiness on feet (R26.81)     Time: 5300-5110 PT Time Calculation (min) (ACUTE ONLY): 15 min  Charges:  $Gait Training: 8-22 mins                     Jannette Spanner PT, DPT Acute Rehabilitation Services Pager: [phone] Office: [phone]  Trena Platt 08/16/2020, 4:32 PM

## 2020-08-17 DIAGNOSIS — J9601 Acute respiratory failure with hypoxia: Secondary | ICD-10-CM | POA: Diagnosis not present

## 2020-08-17 LAB — COMPREHENSIVE METABOLIC PANEL
ALT: 59 U/L — ABNORMAL HIGH (ref 0–44)
AST: 55 U/L — ABNORMAL HIGH (ref 15–41)
Albumin: 2.6 g/dL — ABNORMAL LOW (ref 3.5–5.0)
Alkaline Phosphatase: 86 U/L (ref 38–126)
Anion gap: 9 (ref 5–15)
BUN: 15 mg/dL (ref 8–23)
CO2: 27 mmol/L (ref 22–32)
Calcium: 9.3 mg/dL (ref 8.9–10.3)
Chloride: 102 mmol/L (ref 98–111)
Creatinine, Ser: 0.84 mg/dL (ref 0.61–1.24)
GFR, Estimated: >60 mL/min (ref >60)
Glucose, Bld: 138 mg/dL — ABNORMAL HIGH (ref 70–99)
Potassium: 4.6 mmol/L (ref 3.5–5.1)
Sodium: 138 mmol/L (ref 135–145)
Total Bilirubin: 0.2 mg/dL — ABNORMAL LOW (ref 0.3–1.2)
Total Protein: 6.8 g/dL (ref 6.5–8.1)

## 2020-08-17 LAB — CBC
HCT: 32.2 % — ABNORMAL LOW (ref 39.0–52.0)
Hemoglobin: 10.1 g/dL — ABNORMAL LOW (ref 13.0–17.0)
MCH: 26.2 pg (ref 26.0–34.0)
MCHC: 31.4 g/dL (ref 30.0–36.0)
MCV: 83.6 fL (ref 80.0–100.0)
Platelets: 538 10*3/uL — ABNORMAL HIGH (ref 150–400)
RBC: 3.85 MIL/uL — ABNORMAL LOW (ref 4.22–5.81)
RDW: 16.5 % — ABNORMAL HIGH (ref 11.5–15.5)
WBC: 9.3 10*3/uL (ref 4.0–10.5)
nRBC: 0 % (ref 0.0–0.2)

## 2020-08-17 LAB — VITAMIN B12: Vitamin B-12: 918 pg/mL — ABNORMAL HIGH (ref 180–914)

## 2020-08-17 LAB — FOLATE: Folate: 6.1 ng/mL (ref >5.9)

## 2020-08-17 LAB — IRON AND TIBC
Iron: 25 ug/dL — ABNORMAL LOW (ref 45–182)
Saturation Ratios: 11 % — ABNORMAL LOW (ref 17.9–39.5)
TIBC: 221 ug/dL — ABNORMAL LOW (ref 250–450)
UIBC: 196 ug/dL

## 2020-08-17 LAB — FERRITIN: Ferritin: 725 ng/mL — ABNORMAL HIGH (ref 24–336)

## 2020-08-17 LAB — PREALBUMIN: Prealbumin: 14.1 mg/dL — ABNORMAL LOW (ref 18–38)

## 2020-08-17 MED ORDER — SODIUM CHLORIDE 0.9 % IV SOLN
510.0000 mg | Freq: Once | INTRAVENOUS | Status: AC
  Administered 2020-08-17: 510 mg via INTRAVENOUS
  Filled 2020-08-17: qty 510

## 2020-08-17 NOTE — Progress Notes (Signed)
PROGRESS NOTE    Grant Taylor  BWG:[id number] DOB: Jan 12, 1946 DOA: 08/09/2020 PCP: [name], MD    Brief Narrative:  This 75 years old male with PMH significant for hypertension, COPD and dyslipidemia presented to the ED with worsening shortness of breath.  Patient was recently diagnosed with pancreatic mass on July 26, 2020 at [facility] in [place]. One night prior to the hospitalization he has developed significant cough associated with shortness of breath which was severe enough that prompted him to come to the ED.  Chest radiograph showed bilateral interstitial infiltrate lower lobes and upper lobes,  positive vascular congestion.  CT with multiple pulmonary nodules and masses compatible with metastasis.  Bulky mediastinal and bilateral hilar lymphadenopathy.  CT abdomen and pelvis shows large mass involving the pancreatic tail up to 10 cm extension into the left retroperitoneum and possible involvement of the left hip.  Patient underwent left supra clavicular lymph node biopsy.  Patient is started on bronchodilators,  supplemental oxygen and IV antibiotics. 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  Assessment & Plan:   Principal Problem:   Acute respiratory failure with hypoxia (HCC) Active Problems:   Essential hypertension   COPD (chronic obstructive pulmonary disease) (HCC)   Pancreatic mass   Severe sepsis (HCC)  Acute hypoxic respiratory failure could be multifactorial, sec. to community-acquired pneumonia in the setting of metastatic lung disease and COPD exacerbation: Patient presented with persistent cough and worsening shortness of breath.   Chest x-ray shows bilateral interstitial infiltrates.  Strep pneumonia antigen negative.  Legionella pneumobilia negative. Patient was severely hypoxic on arrival requiring 5 L HFNC sats 95%. Continue supplemental oxygen to keep saturation above  95%. Continue ceftriaxone for 5 days. Weaned down to [facility] @ 4l/min Continue bronchodilator therapy and systemic steroids for COPD exacerbation. Echocardiogram shows LVEF  55- 60%.  No regional wall motion abnormalities. Encourage out of bed to chair and ambulate with physical therapy. Patient will likely need home 02 at discharge.     Metastatic pancreatic cancer: Patient presented with positive signs of metastatic disease.   Lymph node biopsy confirms the diagnosis of cancer with paraneoplastic features. Oncology consulted, patient wants to be treated locally by oncologist. Patient has noncurable condition but oncologist recommended treatment with Abraxane/ Gemzar.   Patient will need to have a Port-A-Cath placed. Continue adequate pain control with hydromorphone and oxycodone.   Hypertension: Continue losartan and amlodipine.  Hyperlipidemia: Continue pravastatin,   Depression:  Continue bupropion.   DVT prophylaxis: Lovenox Code Status:Full code. Family Communication:Family at bed side. Disposition Plan:   Status is: Inpatient  Remains inpatient appropriate because:Inpatient level of care appropriate due to severity of illness  Dispo: The patient is from: Home              Anticipated d/c is to: Home              Patient currently is not medically stable to d/c.   Difficult to place patient No  Consultants:  Oncology  Procedures: CT chest, Abd pelvis  Antimicrobials:   Anti-infectives (From admission, onward)    Start     Dose/Rate Route Frequency Ordered Stop   08/14/20 2000  cefTRIAXone (ROCEPHIN) 1 g in sodium chloride 0.9 % 100 mL IVPB        1 g 200 mL/hr over 30 Minutes Intravenous Every 24 hours 08/14/20 1741     08/12/20 1000  azithromycin (ZITHROMAX) tablet 500 mg  500 mg Oral Daily 08/11/20 1345 08/13/20 0854   08/09/20 1200  cefTRIAXone (ROCEPHIN) 2 g in sodium chloride 0.9 % 100 mL IVPB        2 g 200 mL/hr over 30 Minutes Intravenous Every 24  hours 08/09/20 1142 08/13/20 1530   08/09/20 1200  azithromycin (ZITHROMAX) 500 mg in sodium chloride 0.9 % 250 mL IVPB  Status:  Discontinued        500 mg 250 mL/hr over 60 Minutes Intravenous Daily 08/09/20 1142 08/11/20 1345        Subjective: Patient was seen and examined at bedside.  Overnight events noted.   Patient reports feeling little better,  he is weaned down to 4 L of supplemental oxygen.   He is saturating 94%.  He seems very deconditioned.  He is aware about his incurable cancer now.  Objective: Vitals:   08/17/20 0200 08/17/20 0223 08/17/20 0501 08/17/20 0807  BP:  (!) 140/99 (!) 138/97   Pulse:  100 97   Resp: (!) 21 (!) 23    Temp:  97.8 F (36.6 C) 97.6 F (36.4 C)   TempSrc:  Oral Oral   SpO2:  93% 96% 94%  Weight:      Height:        Intake/Output Summary (Last 24 hours) at 08/17/2020 1207 Last data filed at 08/17/2020 0842 Gross per 24 hour  Intake 940 ml  Output 2230 ml  Net -1290 ml   Filed Weights   08/09/20 1536  Weight: 63.8 kg    Examination:  General exam: Appears calm and comfortable, deconditioned thin built. Respiratory system: Clear to auscultation. Respiratory effort normal. Cardiovascular system: S1 & S2 heard, RRR. No JVD, murmur++,  No rubs, gallops or clicks. No pedal edema. Gastrointestinal system: Abdomen is nondistended, soft and nontender. No organomegaly or masses felt. Normal bowel sounds heard. Central nervous system: Alert and oriented. No focal neurological deficits. Extremities:   No Edema, no cyanosis, no clubbing. Skin: No rashes, lesions or ulcers Psychiatry: Judgement and insight appear normal. Mood & affect appropriate.     Data Reviewed: I have personally reviewed following labs and imaging studies  CBC: Recent Labs  Lab 08/11/20 0345 08/13/20 0340 08/15/20 0354 08/16/20 0334 08/17/20 0322  WBC 12.3* 10.9* 8.5 8.2 9.3  NEUTROABS 8.8*  --  7.4 7.1  --   HGB 9.5* 9.2* 9.7* 9.7* 10.1*  HCT 30.2* 28.8*  31.3* 31.5* 32.2*  MCV 83.0 83.0 83.2 84.2 83.6  PLT 339 354 441* 478* 595*   Basic Metabolic Panel: Recent Labs  Lab 08/11/20 0345 08/13/20 0340 08/15/20 0354 08/16/20 0334 08/17/20 0322  NA 133* 137 138 136 138  K 4.0 3.7 4.5 4.5 4.6  CL 99 101 99 103 102  CO2 25 25 26 25 27   GLUCOSE 116* 103* 155* 168* 138*  BUN 13 11 13 15 15   CREATININE 1.11 0.95 0.94 0.82 0.84  CALCIUM 8.6* 8.6* 9.4 9.1 9.3   GFR: Estimated Creatinine Clearance: 67.5 mL/min (by C-G formula based on SCr of 0.84 mg/dL). Liver Function Tests: Recent Labs  Lab 08/17/20 0322  AST 55*  ALT 59*  ALKPHOS 86  BILITOT 0.2*  PROT 6.8  ALBUMIN 2.6*   No results for input(s): LIPASE, AMYLASE in the last 168 hours. No results for input(s): AMMONIA in the last 168 hours. Coagulation Profile: No results for input(s): INR, PROTIME in the last 168 hours. Cardiac Enzymes: No results for input(s): CKTOTAL, CKMB, CKMBINDEX, TROPONINI in the  last 168 hours. BNP (last 3 results) No results for input(s): PROBNP in the last 8760 hours. HbA1C: No results for input(s): HGBA1C in the last 72 hours. CBG: No results for input(s): GLUCAP in the last 168 hours. Lipid Profile: No results for input(s): CHOL, HDL, LDLCALC, TRIG, CHOLHDL, LDLDIRECT in the last 72 hours. Thyroid Function Tests: No results for input(s): TSH, T4TOTAL, FREET4, T3FREE, THYROIDAB in the last 72 hours. Anemia Panel: Recent Labs    08/17/20 0322  VITAMINB12 918*  FOLATE 6.1  FERRITIN 725*  TIBC 221*  IRON 25*   Sepsis Labs: No results for input(s): PROCALCITON, LATICACIDVEN in the last 168 hours.   Recent Results (from the past 240 hour(s))  Resp Panel by RT-PCR (Flu A&B, Covid) Nasopharyngeal Swab     Status: None   Collection Time: 08/09/20  6:51 AM   Specimen: Nasopharyngeal Swab; Nasopharyngeal(NP) swabs in vial transport medium  Result Value Ref Range Status   SARS Coronavirus 2 by RT PCR NEGATIVE NEGATIVE Final    Comment:  (NOTE) SARS-CoV-2 target nucleic acids are NOT DETECTED.  The SARS-CoV-2 RNA is generally detectable in upper respiratory specimens during the acute phase of infection. The lowest concentration of SARS-CoV-2 viral copies this assay can detect is 138 copies/mL. A negative result does not preclude SARS-Cov-2 infection and should not be used as the sole basis for treatment or other patient management decisions. A negative result may occur with  improper specimen collection/handling, submission of specimen other than nasopharyngeal swab, presence of viral mutation(s) within the areas targeted by this assay, and inadequate number of viral copies(<138 copies/mL). A negative result must be combined with clinical observations, patient history, and epidemiological information. The expected result is Negative.  Fact Sheet for Patients:  EntrepreneurPulse.com.au  Fact Sheet for Healthcare Providers:  IncredibleEmployment.be  This test is no t yet approved or cleared by the [place] FDA and  has been authorized for detection and/or diagnosis of SARS-CoV-2 by FDA under an Emergency Use Authorization (EUA). This EUA will remain  in effect (meaning this test can be used) for the duration of the COVID-19 declaration under Section 564(b)(1) of the Act, 21 U.S.C.section 360bbb-3(b)(1), unless the authorization is terminated  or revoked sooner.       Influenza A by PCR NEGATIVE NEGATIVE Final   Influenza B by PCR NEGATIVE NEGATIVE Final    Comment: (NOTE) The Xpert Xpress SARS-CoV-2/FLU/RSV plus assay is intended as an aid in the diagnosis of influenza from Nasopharyngeal swab specimens and should not be used as a sole basis for treatment. Nasal washings and aspirates are unacceptable for Xpert Xpress SARS-CoV-2/FLU/RSV testing.  Fact Sheet for Patients: EntrepreneurPulse.com.au  Fact Sheet for Healthcare  Providers: IncredibleEmployment.be  This test is not yet approved or cleared by the [place] FDA and has been authorized for detection and/or diagnosis of SARS-CoV-2 by FDA under an Emergency Use Authorization (EUA). This EUA will remain in effect (meaning this test can be used) for the duration of the COVID-19 declaration under Section 564(b)(1) of the Act, 21 U.S.C. section 360bbb-3(b)(1), unless the authorization is terminated or revoked.  Performed at [facility], [place] [address], [place], [place] [address]   Culture, blood (routine x 2) Call MD if unable to obtain prior to antibiotics being given     Status: None   Collection Time: 08/09/20 12:29 PM   Specimen: BLOOD  Result Value Ref Range Status   Specimen Description   Final    BLOOD BLOOD  LEFT FOREARM Performed at [place] [address] [place], [place] [address]    Special Requests   Final    BOTTLES DRAWN AEROBIC AND ANAEROBIC Blood Culture results may not be optimal due to an inadequate volume of blood received in culture bottles Performed at [place] [address], [place], [place] [address]    Culture   Final    NO GROWTH 5 DAYS Performed at [facility], [place] [address], [place], [place] [address]    Report Status 08/14/2020 FINAL  Final  Culture, blood (routine x 2) Call MD if unable to obtain prior to antibiotics being given     Status: None   Collection Time: 08/09/20 12:30 PM   Specimen: BLOOD  Result Value Ref Range Status   Specimen Description   Final    BLOOD RIGHT ANTECUBITAL Performed at [place] [address] [place], [place] [address]    Special Requests   Final    BOTTLES DRAWN AEROBIC AND ANAEROBIC Blood Culture results may not be optimal due to an inadequate volume of blood received in culture bottles Performed at [place] [address],  [place], [place] [address]    Culture   Final    NO GROWTH 5 DAYS Performed at [facility], [place] [address], [place], [place] [address]    Report Status 08/14/2020 FINAL  Final  Expectorated Sputum Assessment w Gram Stain, Rflx to Resp Cult     Status: None   Collection Time: 08/10/20  7:13 PM   Specimen: Expectorated Sputum  Result Value Ref Range Status   Specimen Description EXPECTORATED SPUTUM  Final   Special Requests NONE  Final   Sputum evaluation   Final    THIS SPECIMEN IS ACCEPTABLE FOR SPUTUM CULTURE Performed at [facility], [place] [address] [place], [place] [address]    Report Status 08/10/2020 FINAL  Final  Culture, Respiratory w Gram Stain     Status: None   Collection Time: 08/10/20  7:13 PM  Result Value Ref Range Status   Specimen Description   Final    EXPECTORATED SPUTUM Performed at [facility], [place] [address] [place], [place] [address]    Special Requests   Final    NONE Reflexed from [id number] Performed at [facility], [place] [address], [place], [place] [address]    Gram Stain   Final    RARE WBC PRESENT, PREDOMINANTLY PMN NO ORGANISMS SEEN    Culture   Final    RARE Normal respiratory flora-no Staph aureus or Pseudomonas seen Performed at [place] [address] [place], [place] [address]    Report Status 08/13/2020 FINAL  Final    Radiology Studies: No results found.  Scheduled Meds:  amLODipine  10 mg Oral Daily   buPROPion  300 mg Oral Daily   chlorhexidine  15 mL Mouth Rinse BID   chlorpheniramine-HYDROcodone  5 mL Oral Q12H   cholecalciferol  2,000 Units Oral Daily   dronabinol  2.5 mg Oral BID AC   famotidine  20 mg Oral Daily   feeding supplement  237 mL Oral Daily   gabapentin  300 mg Oral TID   ipratropium-albuterol  3 mL Nebulization TID   loratadine  10 mg Oral Daily   losartan  25 mg Oral Daily   mouth rinse  15 mL Mouth Rinse q12n4p   methylPREDNISolone  (  SOLU-MEDROL) injection  40 mg Intravenous Q24H   pantoprazole  40 mg Oral Daily   polyethylene glycol  17 g Oral Daily   pravastatin  80 mg Oral Daily   Continuous Infusions:  sodium chloride Stopped (08/11/20 1341)   cefTRIAXone (ROCEPHIN)  IV 1 g (08/16/20 2104)     LOS: 7 days    Time spent: 25 mins    Xitlalic Maslin, MD [facility]   If 7PM-7AM, please contact night-coverage

## 2020-08-17 NOTE — Plan of Care (Signed)

## 2020-08-17 NOTE — TOC Progression Note (Signed)
Transition of Care [facility]) - Progression Note    Patient Details  Name: Mahki Spikes MRN: [id number] Date of Birth: Jan 12, 1946  Transition of [facility]) CM/SW Contact  Ross Ludwig, [place] Phone Number: 08/17/2020, 11:06 AM  Clinical Narrative:     CSW spoke to patient's daughter Rema Jasmine, [phone] to discuss home health agencies.  They did not have any preference, CSW spoke to [place] at [facility] and they can accept patient.  Home health agency will have to get approval through [place], [place] requested that physician put orders in so home health can start approval on Monday.  Physician will put in [facility] orders.  CSW to continue to follow patient's progress throughout discharge planning.   Expected Discharge Plan: Home/Self Care Barriers to Discharge: Continued Medical Work up  Expected Discharge Plan and Services Expected Discharge Plan: Home/Self Care   Discharge Planning Services: CM Consult Post Acute Care Choice: Durable Medical Equipment Living arrangements for the past 2 months: Single Family Home                                       Social Determinants of Health (SDOH) Interventions    Readmission Risk Interventions No flowsheet data found.

## 2020-08-17 NOTE — Plan of Care (Signed)
  Problem: Clinical Measurements: Goal: Respiratory complications will improve Outcome: Progressing   Problem: Pain Managment: Goal: General experience of comfort will improve Outcome: Progressing   

## 2020-08-18 ENCOUNTER — Inpatient Hospital Stay (HOSPITAL_COMMUNITY): Payer: No Typology Code available for payment source

## 2020-08-18 DIAGNOSIS — J9601 Acute respiratory failure with hypoxia: Secondary | ICD-10-CM | POA: Diagnosis not present

## 2020-08-18 LAB — PROCALCITONIN: Procalcitonin: <0.1 ng/mL

## 2020-08-18 NOTE — Progress Notes (Signed)
PROGRESS NOTE    Grant Taylor  HWE:[id number] DOB: 04-Jun-1945 DOA: 08/09/2020 PCP: [name], MD    Brief Narrative:  This 75 years old male with PMH significant for hypertension, COPD and dyslipidemia presented to the ED with worsening shortness of breath.  Patient was recently diagnosed with pancreatic mass on July 26, 2020 at [facility] in [place]. One night prior to the hospitalization he has developed significant cough associated with shortness of breath which was severe enough that prompted him to come to the ED.  Chest radiograph showed bilateral interstitial infiltrate lower lobes and upper lobes,  positive vascular congestion.  CT with multiple pulmonary nodules and masses compatible with metastasis.  Bulky mediastinal and bilateral hilar lymphadenopathy.  CT abdomen and pelvis shows large mass involving the pancreatic tail up to 10 cm extension into the left retroperitoneum and possible involvement of the left hip.  Patient underwent left supra clavicular lymph node biopsy.  Patient is started on bronchodilators,  supplemental oxygen and IV antibiotics. 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  Assessment & Plan:   Principal Problem:   Acute respiratory failure with hypoxia (HCC) Active Problems:   Essential hypertension   COPD (chronic obstructive pulmonary disease) (HCC)   Pancreatic mass   Severe sepsis (HCC)  Acute hypoxic respiratory failure could be multifactorial, sec. to community-acquired pneumonia in the setting of metastatic lung disease and COPD exacerbation: Patient presented with persistent cough and worsening shortness of breath.   Chest x-ray shows bilateral interstitial infiltrates.  Strep pneumonia antigen negative.  Legionella pneumobilia negative. Patient was severely hypoxic on arrival requiring 5 L HFNC sats 95%. Continue supplemental oxygen to keep saturation above  95%. Completed antibiotics for 5 days.  Weaned down to [facility] @ 4l/min Continue bronchodilator therapy and systemic steroids for COPD exacerbation. Echocardiogram shows LVEF  55- 60%.  No regional wall motion abnormalities. Encourage out of bed to chair and ambulate with physical therapy. Patient will likely need home 02 at discharge.   Repeat chest x-ray shows improvement in interval opacity.   Metastatic pancreatic cancer: Patient presented with positive signs of metastatic disease.   Lymph node biopsy confirms the diagnosis of cancer with paraneoplastic features. Oncology consulted, patient wants to be treated locally by oncologist. Patient has noncurable condition but oncologist recommended treatment with Abraxane/ Gemzar.   Patient will need to have a Port-A-Cath placed. Continue adequate pain control with hydromorphone and oxycodone.   Hypertension: Continue losartan and amlodipine.  Hyperlipidemia: Continue pravastatin,   Depression:  Continue bupropion.   DVT prophylaxis: Lovenox Code Status:Full code. Family Communication:Family at bed side. Disposition Plan:   Status is: Inpatient  Remains inpatient appropriate because:Inpatient level of care appropriate due to severity of illness  Dispo: The patient is from: Home              Anticipated d/c is to: Home              Patient currently is not medically stable to d/c.   Difficult to place patient No  Consultants:  Oncology  Procedures: CT chest, Abd pelvis  Antimicrobials:   Anti-infectives (From admission, onward)    Start     Dose/Rate Route Frequency Ordered Stop   08/14/20 2000  cefTRIAXone (ROCEPHIN) 1 g in sodium chloride 0.9 % 100 mL IVPB        1 g 200 mL/hr over 30 Minutes Intravenous Every 24 hours 08/14/20 1741     08/12/20  1000  azithromycin (ZITHROMAX) tablet 500 mg        500 mg Oral Daily 08/11/20 1345 08/13/20 0854   08/09/20 1200  cefTRIAXone (ROCEPHIN) 2 g in sodium chloride 0.9 % 100 mL IVPB         2 g 200 mL/hr over 30 Minutes Intravenous Every 24 hours 08/09/20 1142 08/13/20 1530   08/09/20 1200  azithromycin (ZITHROMAX) 500 mg in sodium chloride 0.9 % 250 mL IVPB  Status:  Discontinued        500 mg 250 mL/hr over 60 Minutes Intravenous Daily 08/09/20 1142 08/11/20 1345        Subjective: Patient was seen and examined at bedside.  Overnight events noted.   Patient reports feeling slightly better, reports intermittent hemoptysis,  he is weaned down to 4 L of supplemental oxygen.   He is saturating 94%.  He seems very deconditioned.  He is aware about his incurable cancer now.  Objective: Vitals:   08/17/20 1954 08/17/20 1955 08/18/20 0020 08/18/20 0537  BP:    (!) 144/94  Pulse:  (!) 107 (!) 110 98  Resp:  18 16   Temp:    97.6 F (36.4 C)  TempSrc:    Oral  SpO2: 92% 92% 93% 98%  Weight:      Height:        Intake/Output Summary (Last 24 hours) at 08/18/2020 1122 Last data filed at 08/18/2020 1022 Gross per 24 hour  Intake 720 ml  Output 2700 ml  Net -1980 ml   Filed Weights   08/09/20 1536  Weight: 63.8 kg    Examination:  General exam: Appears calm and comfortable, deconditioned,  thin built. Respiratory system: Clear to auscultation. Respiratory effort normal. Cardiovascular system: S1 & S2 heard, RRR. No JVD, murmur++,  No rubs, gallops or clicks. No pedal edema. Gastrointestinal system: Abdomen is nondistended, soft and nontender. No organomegaly or masses felt. Normal bowel sounds heard. Central nervous system: Alert and oriented. No focal neurological deficits. Extremities:   No Edema, no cyanosis, no clubbing. Skin: No rashes, lesions or ulcers Psychiatry: Judgement and insight appear normal. Mood & affect appropriate.     Data Reviewed: I have personally reviewed following labs and imaging studies  CBC: Recent Labs  Lab 08/13/20 0340 08/15/20 0354 08/16/20 0334 08/17/20 0322  WBC 10.9* 8.5 8.2 9.3  NEUTROABS  --  7.4 7.1  --   HGB  9.2* 9.7* 9.7* 10.1*  HCT 28.8* 31.3* 31.5* 32.2*  MCV 83.0 83.2 84.2 83.6  PLT 354 441* 478* 765*   Basic Metabolic Panel: Recent Labs  Lab 08/13/20 0340 08/15/20 0354 08/16/20 0334 08/17/20 0322  NA 137 138 136 138  K 3.7 4.5 4.5 4.6  CL 101 99 103 102  CO2 25 26 25 27   GLUCOSE 103* 155* 168* 138*  BUN 11 13 15 15   CREATININE 0.95 0.94 0.82 0.84  CALCIUM 8.6* 9.4 9.1 9.3   GFR: Estimated Creatinine Clearance: 67.5 mL/min (by C-G formula based on SCr of 0.84 mg/dL). Liver Function Tests: Recent Labs  Lab 08/17/20 0322  AST 55*  ALT 59*  ALKPHOS 86  BILITOT 0.2*  PROT 6.8  ALBUMIN 2.6*   No results for input(s): LIPASE, AMYLASE in the last 168 hours. No results for input(s): AMMONIA in the last 168 hours. Coagulation Profile: No results for input(s): INR, PROTIME in the last 168 hours. Cardiac Enzymes: No results for input(s): CKTOTAL, CKMB, CKMBINDEX, TROPONINI in the last 168 hours. BNP (  last 3 results) No results for input(s): PROBNP in the last 8760 hours. HbA1C: No results for input(s): HGBA1C in the last 72 hours. CBG: No results for input(s): GLUCAP in the last 168 hours. Lipid Profile: No results for input(s): CHOL, HDL, LDLCALC, TRIG, CHOLHDL, LDLDIRECT in the last 72 hours. Thyroid Function Tests: No results for input(s): TSH, T4TOTAL, FREET4, T3FREE, THYROIDAB in the last 72 hours. Anemia Panel: Recent Labs    08/17/20 0322  VITAMINB12 918*  FOLATE 6.1  FERRITIN 725*  TIBC 221*  IRON 25*   Sepsis Labs: No results for input(s): PROCALCITON, LATICACIDVEN in the last 168 hours.   Recent Results (from the past 240 hour(s))  Resp Panel by RT-PCR (Flu A&B, Covid) Nasopharyngeal Swab     Status: None   Collection Time: 08/09/20  6:51 AM   Specimen: Nasopharyngeal Swab; Nasopharyngeal(NP) swabs in vial transport medium  Result Value Ref Range Status   SARS Coronavirus 2 by RT PCR NEGATIVE NEGATIVE Final    Comment: (NOTE) SARS-CoV-2 target  nucleic acids are NOT DETECTED.  The SARS-CoV-2 RNA is generally detectable in upper respiratory specimens during the acute phase of infection. The lowest concentration of SARS-CoV-2 viral copies this assay can detect is 138 copies/mL. A negative result does not preclude SARS-Cov-2 infection and should not be used as the sole basis for treatment or other patient management decisions. A negative result may occur with  improper specimen collection/handling, submission of specimen other than nasopharyngeal swab, presence of viral mutation(s) within the areas targeted by this assay, and inadequate number of viral copies(<138 copies/mL). A negative result must be combined with clinical observations, patient history, and epidemiological information. The expected result is Negative.  Fact Sheet for Patients:  EntrepreneurPulse.com.au  Fact Sheet for Healthcare Providers:  IncredibleEmployment.be  This test is no t yet approved or cleared by the [place] FDA and  has been authorized for detection and/or diagnosis of SARS-CoV-2 by FDA under an Emergency Use Authorization (EUA). This EUA will remain  in effect (meaning this test can be used) for the duration of the COVID-19 declaration under Section 564(b)(1) of the Act, 21 U.S.C.section 360bbb-3(b)(1), unless the authorization is terminated  or revoked sooner.       Influenza A by PCR NEGATIVE NEGATIVE Final   Influenza B by PCR NEGATIVE NEGATIVE Final    Comment: (NOTE) The Xpert Xpress SARS-CoV-2/FLU/RSV plus assay is intended as an aid in the diagnosis of influenza from Nasopharyngeal swab specimens and should not be used as a sole basis for treatment. Nasal washings and aspirates are unacceptable for Xpert Xpress SARS-CoV-2/FLU/RSV testing.  Fact Sheet for Patients: EntrepreneurPulse.com.au  Fact Sheet for Healthcare  Providers: IncredibleEmployment.be  This test is not yet approved or cleared by the [place] FDA and has been authorized for detection and/or diagnosis of SARS-CoV-2 by FDA under an Emergency Use Authorization (EUA). This EUA will remain in effect (meaning this test can be used) for the duration of the COVID-19 declaration under Section 564(b)(1) of the Act, 21 U.S.C. section 360bbb-3(b)(1), unless the authorization is terminated or revoked.  Performed at [facility], [place] [address] [place], [place] [address]   Culture, blood (routine x 2) Call MD if unable to obtain prior to antibiotics being given     Status: None   Collection Time: 08/09/20 12:29 PM   Specimen: BLOOD  Result Value Ref Range Status   Specimen Description   Final    BLOOD BLOOD LEFT FOREARM Performed at  [facility], [place] [address] [place], [place] [address]    Special Requests   Final    BOTTLES DRAWN AEROBIC AND ANAEROBIC Blood Culture results may not be optimal due to an inadequate volume of blood received in culture bottles Performed at [facility] [address] [place], [place] [address]    Culture   Final    NO GROWTH 5 DAYS Performed at [facility], [place] [address], [place], [place] [address]    Report Status 08/14/2020 FINAL  Final  Culture, blood (routine x 2) Call MD if unable to obtain prior to antibiotics being given     Status: None   Collection Time: 08/09/20 12:30 PM   Specimen: BLOOD  Result Value Ref Range Status   Specimen Description   Final    BLOOD RIGHT ANTECUBITAL Performed at [facility] [address], [place], [place] [address]    Special Requests   Final    BOTTLES DRAWN AEROBIC AND ANAEROBIC Blood Culture results may not be optimal due to an inadequate volume of blood received in culture bottles Performed at [facility] [address],  [place], [place] [address]    Culture   Final    NO GROWTH 5 DAYS Performed at [facility], [place] [address] [place], [place] [address]    Report Status 08/14/2020 FINAL  Final  Expectorated Sputum Assessment w Gram Stain, Rflx to Resp Cult     Status: None   Collection Time: 08/10/20  7:13 PM   Specimen: Expectorated Sputum  Result Value Ref Range Status   Specimen Description EXPECTORATED SPUTUM  Final   Special Requests NONE  Final   Sputum evaluation   Final    THIS SPECIMEN IS ACCEPTABLE FOR SPUTUM CULTURE Performed at [facility], [place] [address], [place], [place] [address]    Report Status 08/10/2020 FINAL  Final  Culture, Respiratory w Gram Stain     Status: None   Collection Time: 08/10/20  7:13 PM  Result Value Ref Range Status   Specimen Description   Final    EXPECTORATED SPUTUM Performed at [facility], [place] [address] [place], [place] [address]    Special Requests   Final    NONE Reflexed from [id number] Performed at [facility], [place] [address] [place], [place] [address]    Gram Stain   Final    RARE WBC PRESENT, PREDOMINANTLY PMN NO ORGANISMS SEEN    Culture   Final    RARE Normal respiratory flora-no Staph aureus or Pseudomonas seen Performed at [facility] [address] [place], [place] [address]    Report Status 08/13/2020 FINAL  Final    Radiology Studies: DG CHEST PORT 1 VIEW  Result Date: 08/18/2020 CLINICAL DATA:  Worsening cough. EXAM: PORTABLE CHEST 1 VIEW COMPARISON:  08/13/2020 FINDINGS: 0841 hours. Peripheral airspace disease seen on the previous x-ray appears improved in the interval. There are persistent numeral bilateral pulmonary nodules with a mid and lower lung predominance. No substantial pleural effusion. Interstitial markings are diffusely coarsened with chronic features. Cardiopericardial silhouette is at upper limits of normal for size. Telemetry leads overlie the chest.  IMPRESSION: 1. Interval improvement in peripheral airspace disease seen on the previous x-ray. 2. Persistent widespread bilateral pulmonary nodules compatible with metastatic disease. Electronically Signed   By: Misty Stanley M.D.   On: 08/18/2020 10:36    Scheduled Meds:  amLODipine  10 mg Oral Daily   buPROPion  300 mg Oral Daily   chlorhexidine  15 mL Mouth Rinse BID   chlorpheniramine-HYDROcodone  5 mL Oral Q12H   cholecalciferol  2,000 Units Oral Daily   dronabinol  2.5 mg Oral BID AC   famotidine  20 mg Oral Daily   feeding supplement  237 mL Oral Daily   gabapentin  300 mg Oral TID   ipratropium-albuterol  3 mL Nebulization TID   loratadine  10 mg Oral Daily   losartan  25 mg Oral Daily   mouth rinse  15 mL Mouth Rinse q12n4p   methylPREDNISolone (SOLU-MEDROL) injection  40 mg Intravenous Q24H   pantoprazole  40 mg Oral Daily   polyethylene glycol  17 g Oral Daily   pravastatin  80 mg Oral Daily   Continuous Infusions:  sodium chloride Stopped (08/11/20 1341)   cefTRIAXone (ROCEPHIN)  IV Stopped (08/17/20 2037)     LOS: 8 days    Time spent: 25 mins    Melanie Openshaw, MD [facility]   If 7PM-7AM, please contact night-coverage

## 2020-08-19 DIAGNOSIS — J9601 Acute respiratory failure with hypoxia: Secondary | ICD-10-CM | POA: Diagnosis not present

## 2020-08-19 LAB — CANCER ANTIGEN 19-9: CA 19-9: <2 U/mL (ref 0–35)

## 2020-08-19 NOTE — Progress Notes (Signed)
PT Cancellation Note  Patient Details Name: Grant Taylor MRN: [id number] DOB: Mar 10, 1945   Cancelled Treatment:    Reason Eval/Treat Not Completed:  Attempted PT tx session. Pt politely declined PT 2* pain. Will check back as schedule allows.     [facility]  Office: [phone] Pager: [phone]

## 2020-08-19 NOTE — TOC Progression Note (Signed)
Transition of Care [facility]) - Progression Note    Patient Details  Name: Grant Taylor MRN: [id number] Date of Birth: April 18, 1945  [facility] CM/SW Contact  Purcell Mouton, RN Phone Number: 08/19/2020, 1:44 PM  Clinical Narrative:    [facility]oke with [facility], will need O2 Sats, with pt walking, with and without O2, then fax O2 company, [phone], H[employer]P, [facility] Orders fax to [phone].     Expected Discharge Plan: Home/Self Care Barriers to Discharge: Continued Medical Work up  Expected Discharge Plan and Services Expected Discharge Plan: Home/Self Care   Discharge Planning Services: CM Consult Post Acute Care Choice: Durable Medical Equipment Living arrangements for the past 2 months: Single Family Home                                       Social Determinants of Health (SDOH) Interventions    Readmission Risk Interventions No flowsheet data found.

## 2020-08-19 NOTE — Progress Notes (Signed)
PROGRESS NOTE    Grant Taylor  WUJ:[id number] DOB: 05/03/1945 DOA: 08/09/2020 PCP: [name], MD    Brief Narrative:  This 75 years old male with PMH significant for hypertension, COPD and dyslipidemia presented to the ED with worsening shortness of breath.  Patient was recently diagnosed with pancreatic mass on July 26, 2020 at [facility] in [place]. One night prior to the hospitalization he has developed significant cough associated with shortness of breath which was severe enough that prompted him to come to the ED.  Chest radiograph showed bilateral interstitial infiltrate lower lobes and upper lobes,  positive vascular congestion.  CT with multiple pulmonary nodules and masses compatible with metastasis.  Bulky mediastinal and bilateral hilar lymphadenopathy.  CT abdomen and pelvis shows large mass involving the pancreatic tail up to 10 cm extension into the left retroperitoneum and possible involvement of the left hip.  Patient underwent left supra clavicular lymph node biopsy.  Patient is started on bronchodilators,  supplemental oxygen and IV antibiotics. 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  Assessment & Plan:   Principal Problem:   Acute respiratory failure with hypoxia (HCC) Active Problems:   Essential hypertension   COPD (chronic obstructive pulmonary disease) (HCC)   Pancreatic mass   Severe sepsis (HCC)  Acute hypoxic respiratory failure could be multifactorial, sec. to community-acquired pneumonia in the setting of metastatic lung disease and COPD exacerbation: Patient presented with persistent cough and worsening shortness of breath.   Chest x-ray shows bilateral interstitial infiltrates.  Strep pneumonia antigen negative.  Legionella pneumobilia negative. Patient was severely hypoxic on arrival requiring 5 L HFNC sats 95%. Continue supplemental oxygen to keep saturation above  95%. Completed antibiotics for 5 days.  Weaned down to [facility] @ 4l/min Continue bronchodilator therapy and systemic steroids for COPD exacerbation. Echocardiogram shows LVEF  55- 60%.  No regional wall motion abnormalities. Encourage out of bed to chair and ambulate with physical therapy. Patient will likely need home 02 at discharge.   Repeat chest x-ray shows improvement in interval opacity.   Metastatic pancreatic cancer: Patient presented with positive signs of metastatic disease.   Lymph node biopsy confirms the diagnosis of cancer with paraneoplastic features. Oncology consulted, patient wants to be treated locally by oncologist. Patient has noncurable condition but oncologist recommended treatment with Abraxane/ Gemzar.   Patient will need to have a Port-A-Cath placed that can be arranged outpatient. Continue adequate pain control with hydromorphone and oxycodone.   Hypertension: Continue losartan and amlodipine.  Hyperlipidemia: Continue pravastatin,   Depression:  Continue bupropion.   DVT prophylaxis: Lovenox Code Status:Full code. Family Communication:Family at bed side. Disposition Plan:   Status is: Inpatient  Remains inpatient appropriate because:Inpatient level of care appropriate due to severity of illness  Dispo: The patient is from: Home              Anticipated d/c is to: Home 08/20/20              Patient currently is not medically stable to d/c.   Difficult to place patient No  Consultants:  Oncology  Procedures: CT chest, Abd pelvis  Antimicrobials:   Anti-infectives (From admission, onward)    Start     Dose/Rate Route Frequency Ordered Stop   08/14/20 2000  cefTRIAXone (ROCEPHIN) 1 g in sodium chloride 0.9 % 100 mL IVPB  Status:  Discontinued        1 g 200 mL/hr over 30 Minutes Intravenous  Every 24 hours 08/14/20 1741 08/18/20 1124   08/12/20 1000  azithromycin (ZITHROMAX) tablet 500 mg        500 mg Oral Daily 08/11/20 1345 08/13/20 0854    08/09/20 1200  cefTRIAXone (ROCEPHIN) 2 g in sodium chloride 0.9 % 100 mL IVPB        2 g 200 mL/hr over 30 Minutes Intravenous Every 24 hours 08/09/20 1142 08/13/20 1530   08/09/20 1200  azithromycin (ZITHROMAX) 500 mg in sodium chloride 0.9 % 250 mL IVPB  Status:  Discontinued        500 mg 250 mL/hr over 60 Minutes Intravenous Daily 08/09/20 1142 08/11/20 1345        Subjective: Patient was seen and examined at bedside.  Overnight events noted. Patient reports feeling much improved,  he is weaned down to 4 L of supplemental oxygen.   He sats 94%.  He seems very deconditioned..  Objective: Vitals:   08/19/20 0137 08/19/20 0606 08/19/20 0735 08/19/20 1230  BP:  (!) 131/91  (!) 148/94  Pulse:  (!) 101  (!) 111  Resp: (!) 22   20  Temp:  97.7 F (36.5 C)  97.6 F (36.4 C)  TempSrc:  Oral  Oral  SpO2:   97% 95%  Weight:      Height:        Intake/Output Summary (Last 24 hours) at 08/19/2020 1351 Last data filed at 08/19/2020 1300 Gross per 24 hour  Intake 600 ml  Output 1925 ml  Net -1325 ml   Filed Weights   08/09/20 1536  Weight: 63.8 kg    Examination:  General exam: Appears comfortable, deconditioned,  thin built, chronically ill looking Respiratory system: Clear to auscultation bilaterally. Cardiovascular system: S1 & S2 heard, RRR. No JVD, murmur++,  No rubs, gallops or clicks. No pedal edema. Gastrointestinal system: Abdomen is soft nondistended and nontender , No organomegaly or masses felt. Normal bowel sounds heard. Central nervous system: Alert and oriented x 2. No focal neurological deficits. Extremities:   No Edema, no cyanosis, no clubbing. Skin: No rashes, lesions or ulcers Psychiatry: Judgement and insight appear normal. Mood & affect appropriate.     Data Reviewed: I have personally reviewed following labs and imaging studies  CBC: Recent Labs  Lab 08/13/20 0340 08/15/20 0354 08/16/20 0334 08/17/20 0322  WBC 10.9* 8.5 8.2 9.3  NEUTROABS   --  7.4 7.1  --   HGB 9.2* 9.7* 9.7* 10.1*  HCT 28.8* 31.3* 31.5* 32.2*  MCV 83.0 83.2 84.2 83.6  PLT 354 441* 478* 229*   Basic Metabolic Panel: Recent Labs  Lab 08/13/20 0340 08/15/20 0354 08/16/20 0334 08/17/20 0322  NA 137 138 136 138  K 3.7 4.5 4.5 4.6  CL 101 99 103 102  CO2 25 26 25 27   GLUCOSE 103* 155* 168* 138*  BUN 11 13 15 15   CREATININE 0.95 0.94 0.82 0.84  CALCIUM 8.6* 9.4 9.1 9.3   GFR: Estimated Creatinine Clearance: 67.5 mL/min (by C-G formula based on SCr of 0.84 mg/dL). Liver Function Tests: Recent Labs  Lab 08/17/20 0322  AST 55*  ALT 59*  ALKPHOS 86  BILITOT 0.2*  PROT 6.8  ALBUMIN 2.6*   No results for input(s): LIPASE, AMYLASE in the last 168 hours. No results for input(s): AMMONIA in the last 168 hours. Coagulation Profile: No results for input(s): INR, PROTIME in the last 168 hours. Cardiac Enzymes: No results for input(s): CKTOTAL, CKMB, CKMBINDEX, TROPONINI in the last 168  hours. BNP (last 3 results) No results for input(s): PROBNP in the last 8760 hours. HbA1C: No results for input(s): HGBA1C in the last 72 hours. CBG: No results for input(s): GLUCAP in the last 168 hours. Lipid Profile: No results for input(s): CHOL, HDL, LDLCALC, TRIG, CHOLHDL, LDLDIRECT in the last 72 hours. Thyroid Function Tests: No results for input(s): TSH, T4TOTAL, FREET4, T3FREE, THYROIDAB in the last 72 hours. Anemia Panel: Recent Labs    08/17/20 0322  VITAMINB12 918*  FOLATE 6.1  FERRITIN 725*  TIBC 221*  IRON 25*   Sepsis Labs: Recent Labs  Lab 08/18/20 0829  PROCALCITON <0.10     Recent Results (from the past 240 hour(s))  Expectorated Sputum Assessment w Gram Stain, Rflx to Resp Cult     Status: None   Collection Time: 08/10/20  7:13 PM   Specimen: Expectorated Sputum  Result Value Ref Range Status   Specimen Description EXPECTORATED SPUTUM  Final   Special Requests NONE  Final   Sputum evaluation   Final    THIS SPECIMEN IS  ACCEPTABLE FOR SPUTUM CULTURE Performed at [facility], [place] [address] [place], [place] [address]    Report Status 08/10/2020 FINAL  Final  Culture, Respiratory w Gram Stain     Status: None   Collection Time: 08/10/20  7:13 PM  Result Value Ref Range Status   Specimen Description   Final    EXPECTORATED SPUTUM Performed at [facility], [place] [address] [place], [place] [address]    Special Requests   Final    NONE Reflexed from [id number] Performed at [facility], [place] [address], [place], [place] [address]    Gram Stain   Final    RARE WBC PRESENT, PREDOMINANTLY PMN NO ORGANISMS SEEN    Culture   Final    RARE Normal respiratory flora-no Staph aureus or Pseudomonas seen Performed at [place] [address], [place], [place] [address]    Report Status 08/13/2020 FINAL  Final    Radiology Studies: DG CHEST PORT 1 VIEW  Result Date: 08/18/2020 CLINICAL DATA:  Worsening cough. EXAM: PORTABLE CHEST 1 VIEW COMPARISON:  08/13/2020 FINDINGS: 0841 hours. Peripheral airspace disease seen on the previous x-ray appears improved in the interval. There are persistent numeral bilateral pulmonary nodules with a mid and lower lung predominance. No substantial pleural effusion. Interstitial markings are diffusely coarsened with chronic features. Cardiopericardial silhouette is at upper limits of normal for size. Telemetry leads overlie the chest. IMPRESSION: 1. Interval improvement in peripheral airspace disease seen on the previous x-ray. 2. Persistent widespread bilateral pulmonary nodules compatible with metastatic disease. Electronically Signed   By: Misty Stanley M.D.   On: 08/18/2020 10:36    Scheduled Meds:  amLODipine  10 mg Oral Daily   buPROPion  300 mg Oral Daily   chlorhexidine  15 mL Mouth Rinse BID   chlorpheniramine-HYDROcodone  5 mL Oral Q12H   cholecalciferol  2,000 Units Oral Daily   dronabinol  2.5 mg Oral BID AC    famotidine  20 mg Oral Daily   feeding supplement  237 mL Oral Daily   gabapentin  300 mg Oral TID   ipratropium-albuterol  3 mL Nebulization TID   loratadine  10 mg Oral Daily   losartan  25 mg Oral Daily   mouth rinse  15 mL Mouth Rinse q12n4p   methylPREDNISolone (SOLU-MEDROL) injection  40 mg Intravenous Q24H   pantoprazole  40 mg  Oral Daily   polyethylene glycol  17 g Oral Daily   pravastatin  80 mg Oral Daily   Continuous Infusions:  sodium chloride Stopped (08/11/20 1341)     LOS: 9 days    Time spent: 25 mins    Donaciano Range, MD [facility]   If 7PM-7AM, please contact night-coverage

## 2020-08-19 NOTE — TOC Progression Note (Signed)
Transition of Care [facility]) - Progression Note    Patient Details  Name: Grant Taylor MRN: [id number] Date of Birth: 1945/11/18  Transition of Care [facility]) CM/SW Contact  Purcell Mouton, RN Phone Number: 08/19/2020, 1:19 PM  Clinical Narrative:     A call was placed to [place] at [facility] for [facility] and DME approval.  Expected Discharge Plan: Home/Self Care Barriers to Discharge: Continued Medical Work up  Expected Discharge Plan and Services Expected Discharge Plan: Home/Self Care   Discharge Planning Services: CM Consult Post Acute Care Choice: Durable Medical Equipment Living arrangements for the past 2 months: Single Family Home                                       Social Determinants of Health (SDOH) Interventions    Readmission Risk Interventions No flowsheet data found.

## 2020-08-19 NOTE — Progress Notes (Signed)
SATURATION QUALIFICATIONS: (This note is used to comply with regulatory documentation for home oxygen)  Patient Saturations on Room Air at Rest = 86%  Patient Saturations on Room Air while Ambulating = unable to ambulate pt on room air d/t desaturation and patient condition  Patient Saturations on 4 Liters of oxygen while Ambulating = 95%  Please briefly explain why patient needs home oxygen: cancer

## 2020-08-20 DIAGNOSIS — J9601 Acute respiratory failure with hypoxia: Secondary | ICD-10-CM | POA: Diagnosis not present

## 2020-08-20 NOTE — TOC Progression Note (Signed)
Transition of Care [facility]) - Progression Note    Patient Details  Name: Wells Mabe MRN: [id number] Date of Birth: 1945-02-12  Transition of Care [facility]) CM/SW Contact  Purcell Mouton, RN Phone Number: 08/20/2020, 1:20 PM  Clinical Narrative:    [facility]. Waiting for a return call. Pt will discharge with [place].    Expected Discharge Plan: Home/Self Care Barriers to Discharge: Continued Medical Work up  Expected Discharge Plan and Services Expected Discharge Plan: Home/Self Care   Discharge Planning Services: CM Consult Post Acute Care Choice: Durable Medical Equipment Living arrangements for the past 2 months: Single Family Home                                       Social Determinants of Health (SDOH) Interventions    Readmission Risk Interventions No flowsheet data found.

## 2020-08-20 NOTE — Progress Notes (Signed)
Patient has c/o of increasing flank pain overnight and today.  Also c/o "squeezing, fleeting pain" in his chest.  EKG performed.  No acute change in vital signs.  PRN pain medications administered as ordered.  Patient expressed some concern with being able to manage his pain at home when discharged.  Dr. Dwyane Dee is aware.  Will continue to monitor.

## 2020-08-20 NOTE — Progress Notes (Signed)
PROGRESS NOTE    Grant Taylor  OXB:[id number] DOB: 1945/10/10 DOA: 08/09/2020 PCP: [name], MD    Brief Narrative:  This 75 years old male with PMH significant for hypertension, COPD and dyslipidemia presented to the ED with worsening shortness of breath.  Patient was recently diagnosed with pancreatic mass on July 26, 2020 at [place] in [place]. One night prior to the hospitalization he has developed significant cough associated with shortness of breath which was severe enough that prompted him to come to the ED.  Chest radiograph showed bilateral interstitial infiltrate lower lobes and upper lobes,  positive vascular congestion.  CT with multiple pulmonary nodules and masses compatible with metastasis.  Bulky mediastinal and bilateral hilar lymphadenopathy.  CT abdomen and pelvis shows large mass involving the pancreatic tail up to 10 cm extension into the left retroperitoneum and possible involvement of the left hip.  Patient underwent left supra clavicular lymph node biopsy.  Patient is started on bronchodilators,  supplemental oxygen and IV antibiotics. 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  Assessment & Plan:   Principal Problem:   Acute respiratory failure with hypoxia (HCC) Active Problems:   Essential hypertension   COPD (chronic obstructive pulmonary disease) (HCC)   Pancreatic mass   Severe sepsis (HCC)  Acute hypoxic respiratory failure could be multifactorial, sec. to community-acquired pneumonia in the setting of metastatic lung disease and COPD exacerbation: Patient presented with persistent cough and worsening shortness of breath.   Chest x-ray shows bilateral interstitial infiltrates.  Strep pneumonia antigen negative.  Legionella pneumobilia negative. Patient was severely hypoxic on arrival requiring 5 L HFNC sats 95%. Continue supplemental oxygen to keep saturation above  95%. Completed antibiotics for 5 days.  Weaned down to [facility] @ 4l/min Continue bronchodilator therapy and systemic steroids for COPD exacerbation. Echocardiogram shows LVEF  55- 60%.  No regional wall motion abnormalities. Encourage out of bed to chair and ambulate with physical therapy. Patient will likely need home 02 at discharge.   Repeat chest x-ray shows improvement in interval opacity.   Metastatic pancreatic cancer: Patient presented with positive signs of metastatic disease.   Lymph node biopsy confirms the diagnosis of cancer with paraneoplastic features. Oncology consulted, patient wants to be treated locally by oncologist. Patient has noncurable condition but oncologist recommended treatment with Abraxane/ Gemzar.   Patient will need to have a Port-A-Cath placed that can be arranged outpatient. Continue adequate pain control with hydromorphone and oxycodone.   Hypertension: Continue losartan and amlodipine.  Hyperlipidemia: Continue pravastatin,   Depression:  Continue bupropion.   DVT prophylaxis: Lovenox Code Status:Full code. Family Communication:Family at bed side. Disposition Plan:   Status is: Inpatient  Remains inpatient appropriate because:Inpatient level of care appropriate due to severity of illness  Dispo: The patient is from: Home              Anticipated d/c is to: Home  Health services.08/21/20              Patient currently is not medically stable to d/c.   Difficult to place patient No  Consultants:  Oncology  Procedures: CT chest, Abd pelvis  Antimicrobials:   Anti-infectives (From admission, onward)    Start     Dose/Rate Route Frequency Ordered Stop   08/14/20 2000  cefTRIAXone (ROCEPHIN) 1 g in sodium chloride 0.9 % 100 mL IVPB  Status:  Discontinued        1 g 200 mL/hr over 30  Minutes Intravenous Every 24 hours 08/14/20 1741 08/18/20 1124   08/12/20 1000  azithromycin (ZITHROMAX) tablet 500 mg        500 mg Oral Daily 08/11/20 1345  08/13/20 0854   08/09/20 1200  cefTRIAXone (ROCEPHIN) 2 g in sodium chloride 0.9 % 100 mL IVPB        2 g 200 mL/hr over 30 Minutes Intravenous Every 24 hours 08/09/20 1142 08/13/20 1530   08/09/20 1200  azithromycin (ZITHROMAX) 500 mg in sodium chloride 0.9 % 250 mL IVPB  Status:  Discontinued        500 mg 250 mL/hr over 60 Minutes Intravenous Daily 08/09/20 1142 08/11/20 1345        Subjective: Patient was seen and examined at bedside.  Overnight events noted. Patient reports having bilateral flank pain, stated he does not better today pain is not controlled. His O2 requirement has increased to 5 L/min.  He seems very deconditioned..  Objective: Vitals:   08/20/20 0423 08/20/20 0500 08/20/20 0801 08/20/20 1029  BP: (!) 132/92   (!) 110/98  Pulse: 98   (!) 110  Resp: 16 14  18   Temp: 97.6 F (36.4 C)   97.9 F (36.6 C)  TempSrc: Oral   Oral  SpO2: 97%  96% 96%  Weight:      Height:        Intake/Output Summary (Last 24 hours) at 08/20/2020 1137 Last data filed at 08/19/2020 2108 Gross per 24 hour  Intake 240 ml  Output 1050 ml  Net -810 ml   Filed Weights   08/09/20 1536  Weight: 63.8 kg    Examination:  General exam: Appears in a lot of pain,  deconditioned,  thin built, chronically ill looking Respiratory system: Clear to auscultation bilaterally. Cardiovascular system: S1 & S2 heard, RRR. No JVD, murmur++,  No rubs, gallops or clicks. No pedal edema. Gastrointestinal system: Abdomen is soft nondistended and nontender , No organomegaly or masses felt. Normal bowel sounds heard. Central nervous system: Alert and oriented x 2. No focal neurological deficits. Extremities:   No Edema, no cyanosis, no clubbing. Skin: No rashes, lesions or ulcers Psychiatry: Judgement and insight appear normal. Mood & affect appropriate.     Data Reviewed: I have personally reviewed following labs and imaging studies  CBC: Recent Labs  Lab 08/15/20 0354 08/16/20 0334  08/17/20 0322  WBC 8.5 8.2 9.3  NEUTROABS 7.4 7.1  --   HGB 9.7* 9.7* 10.1*  HCT 31.3* 31.5* 32.2*  MCV 83.2 84.2 83.6  PLT 441* 478* 812*   Basic Metabolic Panel: Recent Labs  Lab 08/15/20 0354 08/16/20 0334 08/17/20 0322  NA 138 136 138  K 4.5 4.5 4.6  CL 99 103 102  CO2 26 25 27   GLUCOSE 155* 168* 138*  BUN 13 15 15   CREATININE 0.94 0.82 0.84  CALCIUM 9.4 9.1 9.3   GFR: Estimated Creatinine Clearance: 67.5 mL/min (by C-G formula based on SCr of 0.84 mg/dL). Liver Function Tests: Recent Labs  Lab 08/17/20 0322  AST 55*  ALT 59*  ALKPHOS 86  BILITOT 0.2*  PROT 6.8  ALBUMIN 2.6*   No results for input(s): LIPASE, AMYLASE in the last 168 hours. No results for input(s): AMMONIA in the last 168 hours. Coagulation Profile: No results for input(s): INR, PROTIME in the last 168 hours. Cardiac Enzymes: No results for input(s): CKTOTAL, CKMB, CKMBINDEX, TROPONINI in the last 168 hours. BNP (last 3 results) No results for input(s): PROBNP in the  last 8760 hours. HbA1C: No results for input(s): HGBA1C in the last 72 hours. CBG: No results for input(s): GLUCAP in the last 168 hours. Lipid Profile: No results for input(s): CHOL, HDL, LDLCALC, TRIG, CHOLHDL, LDLDIRECT in the last 72 hours. Thyroid Function Tests: No results for input(s): TSH, T4TOTAL, FREET4, T3FREE, THYROIDAB in the last 72 hours. Anemia Panel: No results for input(s): VITAMINB12, FOLATE, FERRITIN, TIBC, IRON, RETICCTPCT in the last 72 hours.  Sepsis Labs: Recent Labs  Lab 08/18/20 0829  PROCALCITON <0.10     Recent Results (from the past 240 hour(s))  Expectorated Sputum Assessment w Gram Stain, Rflx to Resp Cult     Status: None   Collection Time: 08/10/20  7:13 PM   Specimen: Expectorated Sputum  Result Value Ref Range Status   Specimen Description EXPECTORATED SPUTUM  Final   Special Requests NONE  Final   Sputum evaluation   Final    THIS SPECIMEN IS ACCEPTABLE FOR SPUTUM  CULTURE Performed at [facility], [place] [address], [place], [place] [address]    Report Status 08/10/2020 FINAL  Final  Culture, Respiratory w Gram Stain     Status: None   Collection Time: 08/10/20  7:13 PM  Result Value Ref Range Status   Specimen Description   Final    EXPECTORATED SPUTUM Performed at [facility], [place] [address], [place], [place] [address]    Special Requests   Final    NONE Reflexed from [id number] Performed at [facility], [place] [address], [place], [place] [address]    Gram Stain   Final    RARE WBC PRESENT, PREDOMINANTLY PMN NO ORGANISMS SEEN    Culture   Final    RARE Normal respiratory flora-no Staph aureus or Pseudomonas seen Performed at [place] [address] [place], [place] [address]    Report Status 08/13/2020 FINAL  Final    Radiology Studies: No results found.  Scheduled Meds:  amLODipine  10 mg Oral Daily   buPROPion  300 mg Oral Daily   chlorhexidine  15 mL Mouth Rinse BID   chlorpheniramine-HYDROcodone  5 mL Oral Q12H   cholecalciferol  2,000 Units Oral Daily   dronabinol  2.5 mg Oral BID AC   famotidine  20 mg Oral Daily   feeding supplement  237 mL Oral Daily   gabapentin  300 mg Oral TID   ipratropium-albuterol  3 mL Nebulization TID   loratadine  10 mg Oral Daily   losartan  25 mg Oral Daily   mouth rinse  15 mL Mouth Rinse q12n4p   methylPREDNISolone (SOLU-MEDROL) injection  40 mg Intravenous Q24H   pantoprazole  40 mg Oral Daily   polyethylene glycol  17 g Oral Daily   pravastatin  80 mg Oral Daily   Continuous Infusions:  sodium chloride Stopped (08/11/20 1341)     LOS: 10 days    Time spent: 25 mins    Cleo Santucci, MD [facility]   If 7PM-7AM, please contact night-coverage

## 2020-08-21 ENCOUNTER — Inpatient Hospital Stay (HOSPITAL_COMMUNITY): Payer: No Typology Code available for payment source

## 2020-08-21 ENCOUNTER — Encounter: Payer: Self-pay | Admitting: *Deleted

## 2020-08-21 DIAGNOSIS — J9601 Acute respiratory failure with hypoxia: Secondary | ICD-10-CM | POA: Diagnosis not present

## 2020-08-21 HISTORY — PX: IR IMAGING GUIDED PORT INSERTION: IMG5740

## 2020-08-21 LAB — CEA: CEA: 4 ng/mL (ref 0.0–4.7)

## 2020-08-21 LAB — COMPREHENSIVE METABOLIC PANEL
ALT: 50 U/L — ABNORMAL HIGH (ref 0–44)
AST: 60 U/L — ABNORMAL HIGH (ref 15–41)
Albumin: 2.9 g/dL — ABNORMAL LOW (ref 3.5–5.0)
Alkaline Phosphatase: 83 U/L (ref 38–126)
Anion gap: 14 (ref 5–15)
BUN: 20 mg/dL (ref 8–23)
CO2: 25 mmol/L (ref 22–32)
Calcium: 9.9 mg/dL (ref 8.9–10.3)
Chloride: 96 mmol/L — ABNORMAL LOW (ref 98–111)
Creatinine, Ser: 1.2 mg/dL (ref 0.61–1.24)
GFR, Estimated: >60 mL/min (ref >60)
Glucose, Bld: 143 mg/dL — ABNORMAL HIGH (ref 70–99)
Potassium: 4.8 mmol/L (ref 3.5–5.1)
Sodium: 135 mmol/L (ref 135–145)
Total Bilirubin: 0.3 mg/dL (ref 0.3–1.2)
Total Protein: 7.1 g/dL (ref 6.5–8.1)

## 2020-08-21 LAB — CBC WITH DIFFERENTIAL/PLATELET
Abs Immature Granulocytes: 0.12 10*3/uL — ABNORMAL HIGH (ref 0.00–0.07)
Basophils Absolute: 0 10*3/uL (ref 0.0–0.1)
Basophils Relative: 0 %
Eosinophils Absolute: 0 10*3/uL (ref 0.0–0.5)
Eosinophils Relative: 0 %
HCT: 39.5 % (ref 39.0–52.0)
Hemoglobin: 12.2 g/dL — ABNORMAL LOW (ref 13.0–17.0)
Immature Granulocytes: 1 %
Lymphocytes Relative: 9 %
Lymphs Abs: 1.4 10*3/uL (ref 0.7–4.0)
MCH: 26.2 pg (ref 26.0–34.0)
MCHC: 30.9 g/dL (ref 30.0–36.0)
MCV: 84.9 fL (ref 80.0–100.0)
Monocytes Absolute: 0.8 10*3/uL (ref 0.1–1.0)
Monocytes Relative: 5 %
Neutro Abs: 13.1 10*3/uL — ABNORMAL HIGH (ref 1.7–7.7)
Neutrophils Relative %: 85 %
Platelets: 622 10*3/uL — ABNORMAL HIGH (ref 150–400)
RBC: 4.65 MIL/uL (ref 4.22–5.81)
RDW: 17.3 % — ABNORMAL HIGH (ref 11.5–15.5)
WBC: 15.5 10*3/uL — ABNORMAL HIGH (ref 4.0–10.5)
nRBC: 0 % (ref 0.0–0.2)

## 2020-08-21 LAB — PROTIME-INR
INR: 1.1 (ref 0.8–1.2)
Prothrombin Time: 14 seconds (ref 11.4–15.2)

## 2020-08-21 LAB — CHROMOGRANIN A: Chromogranin A (ng/mL): 1032 ng/mL — ABNORMAL HIGH (ref 0.0–101.8)

## 2020-08-21 MED ORDER — FENTANYL CITRATE (PF) 100 MCG/2ML IJ SOLN
INTRAMUSCULAR | Status: AC
  Filled 2020-08-21: qty 2

## 2020-08-21 MED ORDER — SODIUM CHLORIDE 0.9% FLUSH: 10.0000 mL | INTRAVENOUS | Status: DC | PRN

## 2020-08-21 MED ORDER — MIDAZOLAM HCL 2 MG/2ML IJ SOLN
INTRAMUSCULAR | Status: AC | PRN
  Administered 2020-08-21 (×2): 1 mg via INTRAVENOUS

## 2020-08-21 MED ORDER — LIDOCAINE HCL 1 % IJ SOLN
INTRAMUSCULAR | Status: AC
  Filled 2020-08-21: qty 20

## 2020-08-21 MED ORDER — LIDOCAINE HCL (PF) 1 % IJ SOLN
INTRAMUSCULAR | Status: AC | PRN
  Administered 2020-08-21: 5 mL

## 2020-08-21 MED ORDER — FENTANYL CITRATE (PF) 100 MCG/2ML IJ SOLN
INTRAMUSCULAR | Status: AC | PRN
  Administered 2020-08-21 (×2): 50 ug via INTRAVENOUS

## 2020-08-21 MED ORDER — LIDOCAINE-EPINEPHRINE 1 %-1:100000 IJ SOLN
INTRAMUSCULAR | Status: AC | PRN
  Administered 2020-08-21: 20 mL

## 2020-08-21 MED ORDER — SODIUM CHLORIDE 0.9% FLUSH
10.0000 mL | Freq: Two times a day (BID) | INTRAVENOUS | Status: DC
  Administered 2020-08-22: 10 mL

## 2020-08-21 MED ORDER — LIDOCAINE-EPINEPHRINE 1 %-1:100000 IJ SOLN
INTRAMUSCULAR | Status: AC
  Filled 2020-08-21: qty 1

## 2020-08-21 MED ORDER — CHLORHEXIDINE GLUCONATE CLOTH 2 % EX PADS
6.0000 | MEDICATED_PAD | Freq: Every day | CUTANEOUS | Status: DC
  Administered 2020-08-22: 6 via TOPICAL

## 2020-08-21 MED ORDER — MIDAZOLAM HCL 2 MG/2ML IJ SOLN
INTRAMUSCULAR | Status: AC
  Filled 2020-08-21: qty 4

## 2020-08-21 NOTE — Procedures (Signed)
Interventional Radiology Procedure:   Indications: Metastatic pancreatic cancer  Procedure: Port placement  Findings: Right jugular port, tip at SVC/RA junction  Complications: None     EBL: Minimal, less than 10 ml  Plan: Port is accessed and ready to use.   Bently Wyss R. Anselm Pancoast, MD  Pager: [phone]

## 2020-08-21 NOTE — Progress Notes (Signed)
Referring Physician(s): Ennever,P  Supervising Physician: Markus Daft  Patient Status:  [facility] - In-pt  Chief Complaint:  Pancreatic cancer  Subjective: Patient familiar to IR service from left supraclavicular lymph node biopsy on 08/13/2020.  He has a history of hypertension, obstructive sleep apnea, BPH, vit B12 deficiency, depression, COPD, dyslipidemia and recently diagnosed metastatic pancreatic cancer associated community-acquired pneumonia/COPD exacerbation.  He is currently afebrile, but remains tachycardic.  WBC 15.5-has been on IV Solu-Medrol.  COVID 19 negative.  Request now received from oncology for Port-A-Cath placement for planned chemotherapy.  Patient currently denies fever, headache, chest pain, abdominal pain, nausea, vomiting or bleeding.  He has had weight loss, chronic dyspnea and occasional cough and back pain.  Past Medical History:  Diagnosis Date   Cancer ([place])    Chronic GERD    Constipation    ED (erectile dysfunction)    History of BPH    Hyperlipemia    Hypertension    Hypokalemia    OAB (overactive bladder)    Obstructive sleep apnea    Positive PPD    Vitamin B 12 deficiency    Past Surgical History:  Procedure Laterality Date   LAMINECTOMY AND MICRODISCECTOMY LUMBAR SPINE  2007      Allergies: Levofloxacin, Lisinopril, Diclofenac, Dust mite extract, Elemental sulfur, Etodolac, No known allergies, Tolterodine, Citalopram, and Other  Medications: Prior to Admission medications   Medication Sig Start Date End Date Taking? Authorizing Provider  albuterol (VENTOLIN HFA) 108 (90 Base) MCG/ACT inhaler Inhale into the lungs every 6 (six) hours as needed for wheezing or shortness of breath.   Yes [provider]  amLODipine (NORVASC) 10 MG tablet Take 10 mg by mouth daily. 10/31/19  Yes [provider]  benzonatate (TESSALON) 100 MG capsule Take 100 mg by mouth 3 (three) times daily as needed for cough. 08/06/20  Yes [provider]  buPROPion (WELLBUTRIN XL) 300 MG 24 hr tablet Take 300 mg by mouth daily.   Yes [provider]  Carboxymethylcellulose Sodium 1 % GEL Place 1 drop into both eyes 2 (two) times daily as needed (dry eyes). 06/21/19  Yes [provider]  chlorpheniramine (CHLOR-TRIMETON) 4 MG tablet Take 4 mg by mouth daily as needed for allergies. 05/05/16  Yes [provider]  Cholecalciferol 50 MCG (2000 UT) TABS Take 2,000 Units by mouth daily. 10/31/19  Yes [provider]  cyclobenzaprine (FLEXERIL) 10 MG tablet Take 10 mg by mouth 3 (three) times daily as needed for muscle spasms.   Yes [provider]  diclofenac (VOLTAREN) 75 MG EC tablet Take 75 mg by mouth 2 (two) times daily.   Yes [provider]  diclofenac Sodium (VOLTAREN) 1 % GEL Apply 4 g topically 4 (four) times daily as needed (pain).   Yes [provider]  docusate sodium (COLACE) 100 MG capsule Take 100 mg by mouth daily as needed for mild constipation.   Yes [provider]  famotidine (PEPCID) 20 MG tablet Take 20 mg by mouth daily.   Yes [provider]  feeding supplement (ENSURE ENLIVE / ENSURE PLUS) LIQD Take 237 mLs by mouth daily. 05/07/20  Yes [provider]  fluticasone (FLONASE) 50 MCG/ACT nasal spray Place 1 spray into both nostrils daily as needed for allergies. 10/31/19  Yes [provider]  gabapentin (NEURONTIN) 300 MG capsule Take 300 mg by mouth 3 (three) times daily. 07/26/20  Yes [provider]  guaifenesin (HUMIBID E) 400 MG TABS  tablet Take 400 mg by mouth daily as needed for congestion. 05/05/16  Yes [provider]  hydrOXYzine (ATARAX/VISTARIL) 10 MG tablet Take 10 mg by mouth daily as needed for anxiety or sleep. 03/25/20  Yes [provider]  loratadine (CLARITIN) 10 MG tablet Take 10 mg by mouth daily. 10/31/19  Yes [provider]  losartan (COZAAR) 50 MG tablet Take 25 mg by  mouth daily.   Yes [provider]  methocarbamol (ROBAXIN) 500 MG tablet Take 1 tablet (500 mg total) by mouth every 6 (six) hours as needed for muscle spasms. 07/05/20  Yes Kirsteins, Luanna Salk, MD  omeprazole (PRILOSEC) 40 MG capsule Take 40 mg by mouth 2 (two) times daily.   Yes [provider]  ondansetron (ZOFRAN-ODT) 4 MG disintegrating tablet Take 4 mg by mouth 3 (three) times daily as needed for nausea/vomiting. 08/07/20  Yes [provider]  oxyCODONE (OXY IR/ROXICODONE) 5 MG immediate release tablet Take 5 mg by mouth every 6 (six) hours as needed for pain. 08/07/20  Yes [provider]  polyethylene glycol (MIRALAX / GLYCOLAX) 17 g packet Take 17 g by mouth daily. 12/27/19  Yes Khatri, Hina, PA-C  potassium chloride SA (KLOR-CON) 20 MEQ tablet Take 20 mEq by mouth 2 (two) times daily. 10/31/19  Yes [provider]  pravastatin (PRAVACHOL) 80 MG tablet Take 80 mg by mouth daily.   Yes [provider]  sildenafil (VIAGRA) 100 MG tablet Take 100 mg by mouth daily as needed for erectile dysfunction.   Yes [provider]  sodium chloride (BRONCHO SALINE) inhaler solution Take 1 spray by nebulization as needed (shortness of breath).   Yes [provider]  tiZANidine (ZANAFLEX) 4 MG tablet Take 4 mg by mouth 3 (three) times daily as needed for muscle spasms. 07/26/20  Yes [provider]  gabapentin (NEURONTIN) 100 MG capsule Take 1 capsule (100 mg total) by mouth 3 (three) times daily. Patient not taking: No sig reported 07/08/20   Charlett Blake, MD     Vital Signs: BP (!) 133/97 (BP Location: Right Arm)   Pulse (!) 103   Temp (!) 97.5 F (36.4 C) (Oral)   Resp 20   Ht 5\' 7"  (1.702 m)   Wt 140 lb 10.5 oz (63.8 kg)   SpO2 96%   BMI 22.03 kg/m   Physical Exam patient awake, alert.  Chest with  few bibasilar crackles.  Heart with tachycardic but regular rhythm.  Abdomen soft, positive bowel sounds,  nontender.  No lower extremity edema.  Imaging: DG CHEST PORT 1 VIEW  Result Date: 08/18/2020 CLINICAL DATA:  Worsening cough. EXAM: PORTABLE CHEST 1 VIEW COMPARISON:  08/13/2020 FINDINGS: 0841 hours. Peripheral airspace disease seen on the previous x-ray appears improved in the interval. There are persistent numeral bilateral pulmonary nodules with a mid and lower lung predominance. No substantial pleural effusion. Interstitial markings are diffusely coarsened with chronic features. Cardiopericardial silhouette is at upper limits of normal for size. Telemetry leads overlie the chest. IMPRESSION: 1. Interval improvement in peripheral airspace disease seen on the previous x-ray. 2. Persistent widespread bilateral pulmonary nodules compatible with metastatic disease. Electronically Signed   By: Misty Stanley M.D.   On: 08/18/2020 10:36    Labs:  CBC: Recent Labs    08/15/20 0354 08/16/20 0334 08/17/20 0322 08/21/20 0723  WBC 8.5 8.2 9.3 15.5*  HGB 9.7* 9.7* 10.1* 12.2*  HCT 31.3* 31.5* 32.2* 39.5  PLT 441* 478* 538* 622*  COAGS: No results for input(s): INR, APTT in the last 8760 hours.  BMP: Recent Labs    08/15/20 0354 08/16/20 0334 08/17/20 0322 08/21/20 0723  NA 138 136 138 135  K 4.5 4.5 4.6 4.8  CL 99 103 102 96*  CO2 26 25 27 25   GLUCOSE 155* 168* 138* 143*  BUN 13 15 15 20   CALCIUM 9.4 9.1 9.3 9.9  CREATININE 0.94 0.82 0.84 1.20  GFRNONAA >60 >60 >60 >60    LIVER FUNCTION TESTS: Recent Labs    12/27/19 1150 08/09/20 0636 08/17/20 0322 08/21/20 0723  BILITOT 0.7 1.3* 0.2* 0.3  AST 17 99* 55* 60*  ALT 14 65* 59* 50*  ALKPHOS 64 82 86 83  PROT 7.0 6.9 6.8 7.1  ALBUMIN 3.6 2.6* 2.6* 2.9*    Assessment and Plan: Patient familiar to IR service from left supraclavicular lymph node biopsy on 08/13/2020.  He has a history of hypertension, obstructive sleep apnea, BPH, vit B12 deficiency, depression, COPD, dyslipidemia and recently diagnosed metastatic  pancreatic cancer associated community-acquired pneumonia/COPD exacerbation.  He is currently afebrile, but remains tachycardic.  WBC 15.5-has been on IV Solu-Medrol.  COVID 19 negative.  Request now received from oncology for Port-A-Cath placement for planned chemotherapy.  Case reviewed by Dr. Anselm Pancoast and d/w Dr. Marin Olp. Risks and benefits of image guided port-a-catheter placement was discussed with the patient including, but not limited to bleeding, infection, pneumothorax, or fibrin sheath development and need for additional procedures.  All of the patient's questions were answered, patient is agreeable to proceed. Consent signed and in chart.  Procedure tentatively scheduled for later today.    Electronically Signed: D. Rowe Robert, PA-C 08/21/2020, 9:23 AM   I spent a total of 25 minutes at the the patient's bedside AND on the patient's hospital floor or unit, greater than 50% of which was counseling/coordinating care for Port-A-Cath placement    Patient ID: Grant Taylor, male   DOB: [birth year], 75 y.o.   MRN: [id number]

## 2020-08-21 NOTE — Plan of Care (Signed)
  Problem: Clinical Measurements: Goal: Will remain free from infection Outcome: Progressing Goal: Diagnostic test results will improve Outcome: Progressing Goal: Respiratory complications will improve Outcome: Progressing   Problem: Activity: Goal: Risk for activity intolerance will decrease Outcome: Progressing

## 2020-08-21 NOTE — Progress Notes (Signed)
Surprisingly, Grant Taylor still in the hospital.  It sounds like there some difficulty getting some oxygen for him at home.  I guess this has to go through the [place].  I found out that if he wants to be treated in the community, we have to get a formal referral from the [facility] hospital.  I am unsure who this would come through.  He might have to come through his primary care doctor with the [place] system.  Since he is in the hospital right now, we will try to get the Port-A-Cath put in.  He has not had lab work for 4 days.  I do think it be a bad idea to get some labs on him.  His CA 19-9 is normal.  I think this goes to show that this is a very undifferentiated tumor that he has.  This is certainly troublesome to me.  I think he is going to have to get treatment quickly.  Again, I will know if we we will be able to treat him in the community or if he is going to need treatment at the [place].  I do not know how long it would take to get an approval from the [place] system to treat him.  I will have to send his tumor off for molecular analysis to see if there might be any type of molecular abnormality that we might be able to target.  Overall, he is eating a little bit better.  Maybe, at the [place] could be helping a little bit.  This is very complex.  Again, we will going to have to work with the [place] system to see how we can try to help Mr. Hands.  Again, he is a true American hero for surgery and [place] and trying to help keep is safe.   Lattie Haw, MD  Darlyn Chamber 29:11

## 2020-08-21 NOTE — TOC Progression Note (Signed)
Transition of Care [facility]) - Progression Note    Patient Details  Name: Thurmon Mizell MRN: [id number] Date of Birth: 10/06/1945  Transition of Care [facility] CM/SW Contact  Purcell Mouton, RN Phone Number: 08/21/2020, 10:20 AM  Clinical Narrative:    CM called several times to [facility] and [employer] to check on O2 for pt. Just round around from one person to another. [facility] [place] [facility] O[facility]. Will continue to call [employer] DME [phone].   Expected Discharge Plan: Home/Self Care Barriers to Discharge: Continued Medical Work up  Expected Discharge Plan and Services Expected Discharge Plan: Home/Self Care   Discharge Planning Services: CM Consult Post Acute Care Choice: Durable Medical Equipment Living arrangements for the past 2 months: Single Family Home                                       Social Determinants of Health (SDOH) Interventions    Readmission Risk Interventions No flowsheet data found.

## 2020-08-21 NOTE — Progress Notes (Signed)
Physical Therapy Treatment Patient Details Name: Grant Taylor MRN: [id number] DOB: Mar 15, 1945 Today's Date: 08/21/2020    History of Present Illness Pt is 75 yo male admitted with sepsis, acute resp failure due to PNE and mets to lung on 08/09/20.  Pt with hx of metastatic pancreatic CA and COPD.    PT Comments    Pt in bed on 4 lts sats 96% highly motivated and eager to walk.  Assisted OOB.  General bed mobility comments: increased time, use of rail anmd HOB elevated.  General transfer comment: increased time but good usde of B UE's to steady self.  General Gait Details: uses lofstrand crutch on Right, decreased DF on left with slight circumduction, required one standing rest break, SPO2 86% on 4L O2 [place] however improved with breathing and rest break to 90%, Spo2 91% on 4L end of session.  Assisted back to bed per pt request. Pt schedule to receive a port a cath today  Follow Up Recommendations  Home health PT;Supervision - Intermittent     Equipment Recommendations  None recommended by PT    Recommendations for Other Services       Precautions / Restrictions Precautions Precautions: Fall Precaution Comments: prefers shoes; monitor O2    Mobility  Bed Mobility Overal bed mobility: Needs Assistance Bed Mobility: Supine to Sit;Sit to Supine     Supine to sit: Supervision Sit to supine: Supervision   General bed mobility comments: increased time, use of rail anmd HOB elevated    Transfers Overall transfer level: Needs assistance Equipment used: Lofstrands (right) Transfers: Sit to/from [facility] Sit to Stand: Supervision Stand pivot transfers: Supervision;Min guard       General transfer comment: increased time but good usde of B UE's to steady self  Ambulation/Gait Ambulation/Gait assistance: Min guard Gait Distance (Feet): 75 Feet Assistive device: Lofstrands Gait Pattern/deviations: Decreased dorsiflexion - left;Step-through  pattern;Decreased stance time - left Gait velocity: decreased   General Gait Details: uses lofstrand crutch on Right, decreased DF on left with slight circumduction, required one standing rest break, SPO2 86% on 4L O2 [place] however improved with breathing and rest break to 90%, Spo2 91% on 4L end of session   Stairs             Wheelchair Mobility    Modified Rankin (Stroke Patients Only)       Balance                                            Cognition Arousal/Alertness: Awake/alert Behavior During Therapy: WFL for tasks assessed/performed Overall Cognitive Status: Within Functional Limits for tasks assessed                                 General Comments: AxO x 3 pleasant      Exercises      General Comments        Pertinent Vitals/Pain Pain Assessment: Faces Faces Pain Scale: Hurts a little bit Pain Location: flank Pain Descriptors / Indicators: Discomfort;Sore Pain Intervention(s): Monitored during session    Home Living                      Prior Function            PT Goals (current goals can now  be found in the care plan section) Progress towards PT goals: Progressing toward goals    Frequency    Min 3X/week      PT Plan Current plan remains appropriate    Co-evaluation              AM-PAC PT "6 Clicks" Mobility   Outcome Measure  Help needed turning from your back to your side while in a flat bed without using bedrails?: None Help needed moving from lying on your back to sitting on the side of a flat bed without using bedrails?: None Help needed moving to and from a bed to a chair (including a wheelchair)?: A Little Help needed standing up from a chair using your arms (e.g., wheelchair or bedside chair)?: A Little Help needed to walk in hospital room?: A Little Help needed climbing 3-5 steps with a railing? : A Lot 6 Click Score: 19    End of Session Equipment Utilized During  Treatment: Gait belt;Oxygen Activity Tolerance: Patient tolerated treatment well Patient left: in bed;with bed alarm set Nurse Communication: Mobility status PT Visit Diagnosis: Difficulty in walking, not elsewhere classified (R26.2);Muscle weakness (generalized) (M62.81);Unsteadiness on feet (R26.81)     Time: 6759-1638 PT Time Calculation (min) (ACUTE ONLY): 16 min  Charges:  $Gait Training: 8-22 mins                     {Tonga Prout  PTA Acute  Rehabilitation [employer]      [phone] Office      [phone]

## 2020-08-21 NOTE — Progress Notes (Signed)
PROGRESS NOTE    Grant Taylor  BMW:[id number] DOB: Feb 02, 1945 DOA: 08/09/2020 PCP: [name], MD    Brief Narrative:  Grant Taylor was admitted to the hospital with working diagnosis of acute hypoxemic respiratory failure due to community-acquired pneumonia/ pulmonary metastatic disease, complicated with severe sepsis, present on admission.   75 year old male past medical history of hypertension, COPD and dyslipidemia who presented with dyspnea.  Recently diagnosed with pancreatic mass July 26, 2020 at the [place] in [place].  The night prior to hospitalization he developed significant cough, associated with dyspnea which was severe enough that prompted him to come to the hospital.  On his initial physical examination his heart rate was 130, blood pressure 150/78, respiratory rate 24, temperature 98.3, oxygen saturation 93%.  His lungs had positive rhonchi and rales, heart S1-S2 present, rhythmic, tachycardic, abdomen was soft nontender, no lower extremity edema.    Chest radiograph with bilateral interstitial infiltrates, lower lobes and upper lobes, positive vascular congestion.   CT with multiple pulmonary nodules and masses compatible with metastasis. Bulky mediastinal and bilateral hilar adenopathy. CT of the abdomen pelvis with large mass involving the pancreatic tail up to 10 cm.  Extension into the left retroperitoneum and possible involvement of the left hip.   Underwent left neck supraclavicular lymph node biopsy.    Patient was placed on bronchodilator therapy, supplemental oxygen, and IV antibiotics. Suspected non cardiogenic pulmonary edema and COPD exacerbation.   Biopsy positive for metastatic pancreatic cancer. Port a Cath was placed today. Plan for outpatient follow up with Oncology. Patient prefers to continue care locally.    Assessment & Plan:   Principal Problem:   Acute respiratory failure with hypoxia (HCC) Active Problems:   Essential  hypertension   COPD (chronic obstructive pulmonary disease) (HCC)   Pancreatic mass   Severe sepsis (HCC)   Acute hypoxemic respiratory failure due to community acquired pneumonia in the setting of metastatic lung disease. Acute COPD exacerbation.  Chest film with bilateral interstitial infiltrates with more dense infiltrate at the right lowe lobe (07/19) personally reviewed. Oxygenation is 94% on 4 L/min per [place].    Completed antibiotic therapy with good toleration, dyspnea continue to improve.  On bronchodilator therapy and airway clearing techniques. Antitussive agents. Will stop steroids today.    Continue home 02 at home.    2. Pancreatic cancer. positive for metastatic pancreatic cancer, port has been placed today. Will need outpatient follow up with Oncology.   On hydromorphone and oxycodone for pain control.    3. HTN/ dyslipidemia.  Blood pressure control with losartan and amlodipine  Continue with statin therapy.   4. Depression. On bupropion.  5. AKI. Renal function with serum cr at 1,2 with K at 4,8 and serum bicarbonate at 25. Continue close follow up of renal function and electrolytes. Avoid hypotension or nephrotoxic medications.   Status is: Inpatient  Remains inpatient appropriate because:IV treatments appropriate due to intensity of illness or inability to take PO  Dispo: The patient is from: Home              Anticipated d/c is to: Home              Patient currently is not medically stable to d/c.   Difficult to place patient No   DVT prophylaxis: Enoxaparin   Code Status:   full  Family Communication:  I spoke over the phone with the patient's daughter about patient's  condition, plan of care, prognosis and all questions were  addressed.    Consultants:  Oncology  IR   Procedures:  Lymph node biopsy Port placement    Subjective: Patient with slowly improving dyspnea, with no nausea or vomiting, continue to be very weak and deconditioned    Objective: Vitals:   08/21/20 1125 08/21/20 1130 08/21/20 1135 08/21/20 1220  BP: 108/89 122/81 120/87 (!) 128/95  Pulse: (!) 106 (!) 105 (!) 103 (!) 110  Resp: 17 20 12 15   Temp:    97.7 F (36.5 C)  TempSrc:    Oral  SpO2: 92% 95% 93% 94%  Weight:      Height:        Intake/Output Summary (Last 24 hours) at 08/21/2020 1327 Last data filed at 08/21/2020 1045 Gross per 24 hour  Intake --  Output 1175 ml  Net -1175 ml   Filed Weights   08/09/20 1536  Weight: 63.8 kg    Examination:   General: Not in pain or dyspnea, deconditioned  Neurology: Awake and alert, non focal  E ENT: no pallor, no icterus, oral mucosa moist Cardiovascular: No JVD. S1-S2 present, rhythmic, no gallops, rubs, or murmurs. No lower extremity edema. Pulmonary:  positive breath sounds bilaterally, with, no wheezing, rhonchi, positive right base rales. Gastrointestinal. Abdomen soft and non tender Skin. No rashes Musculoskeletal: no joint deformities     Data Reviewed: I have personally reviewed following labs and imaging studies  CBC: Recent Labs  Lab 08/15/20 0354 08/16/20 0334 08/17/20 0322 08/21/20 0723  WBC 8.5 8.2 9.3 15.5*  NEUTROABS 7.4 7.1  --  13.1*  HGB 9.7* 9.7* 10.1* 12.2*  HCT 31.3* 31.5* 32.2* 39.5  MCV 83.2 84.2 83.6 84.9  PLT 441* 478* 538* 970*   Basic Metabolic Panel: Recent Labs  Lab 08/15/20 0354 08/16/20 0334 08/17/20 0322 08/21/20 0723  NA 138 136 138 135  K 4.5 4.5 4.6 4.8  CL 99 103 102 96*  CO2 26 25 27 25   GLUCOSE 155* 168* 138* 143*  BUN 13 15 15 20   CREATININE 0.94 0.82 0.84 1.20  CALCIUM 9.4 9.1 9.3 9.9   GFR: Estimated Creatinine Clearance: 47.3 mL/min (by C-G formula based on SCr of 1.2 mg/dL). Liver Function Tests: Recent Labs  Lab 08/17/20 0322 08/21/20 0723  AST 55* 60*  ALT 59* 50*  ALKPHOS 86 83  BILITOT 0.2* 0.3  PROT 6.8 7.1  ALBUMIN 2.6* 2.9*   No results for input(s): LIPASE, AMYLASE in the last 168 hours. No results for  input(s): AMMONIA in the last 168 hours. Coagulation Profile: Recent Labs  Lab 08/21/20 0928  INR 1.1   Cardiac Enzymes: No results for input(s): CKTOTAL, CKMB, CKMBINDEX, TROPONINI in the last 168 hours. BNP (last 3 results) No results for input(s): PROBNP in the last 8760 hours. HbA1C: No results for input(s): HGBA1C in the last 72 hours. CBG: No results for input(s): GLUCAP in the last 168 hours. Lipid Profile: No results for input(s): CHOL, HDL, LDLCALC, TRIG, CHOLHDL, LDLDIRECT in the last 72 hours. Thyroid Function Tests: No results for input(s): TSH, T4TOTAL, FREET4, T3FREE, THYROIDAB in the last 72 hours. Anemia Panel: No results for input(s): VITAMINB12, FOLATE, FERRITIN, TIBC, IRON, RETICCTPCT in the last 72 hours.    Radiology Studies: I have reviewed all of the imaging during this hospital visit personally     Scheduled Meds:  amLODipine  10 mg Oral Daily   buPROPion  300 mg Oral Daily   chlorhexidine  15 mL Mouth Rinse BID   [START ON  08/22/2020] Chlorhexidine Gluconate Cloth  6 each Topical Daily   chlorpheniramine-HYDROcodone  5 mL Oral Q12H   cholecalciferol  2,000 Units Oral Daily   dronabinol  2.5 mg Oral BID AC   famotidine  20 mg Oral Daily   feeding supplement  237 mL Oral Daily   fentaNYL       gabapentin  300 mg Oral TID   ipratropium-albuterol  3 mL Nebulization TID   lidocaine       lidocaine-EPINEPHrine       loratadine  10 mg Oral Daily   losartan  25 mg Oral Daily   mouth rinse  15 mL Mouth Rinse q12n4p   methylPREDNISolone (SOLU-MEDROL) injection  40 mg Intravenous Q24H   midazolam       pantoprazole  40 mg Oral Daily   polyethylene glycol  17 g Oral Daily   pravastatin  80 mg Oral Daily   Continuous Infusions:  sodium chloride Stopped (08/11/20 1341)     LOS: 11 days        Davette Nugent Gerome Apley, MD

## 2020-08-21 NOTE — Progress Notes (Signed)
Per Dr Marin Olp, request for oncomap testing sent on specimen [phone] DOS 08/12/2020.  Dr Marin Olp also states that patient would like to transfer care to our office from the [place]. Per policy, we will need a referral from the [place] with accompanying authorization.   Called patient's PCP, Dr Sharlynn Oliphant at [phone] ext [phone]. They directed me to patient's Care Coordinator Lorrin Goodell at [phone]. Spoke with Estill Bamberg and she states my request needs to go back to the PCP and that she only works with the chart once the transfer of care has been approved.   Called the patient's PCP once again. At this time I am unable to reach anyone in this office or leave a message.   At 3:30p was able to get contact with [facility]. She took a message regarding need for referral and stated she would ask the responsible person to return my call.   Received a call from [facility] at patient's PCP. She states that since patient has already been referred to, and established with an oncologist through the [place], they would not order a referral to this office. They suggested calling his Oncologist and seeing if they would put in the referral.  Called the oncology clinic at [phone] ext [phone] without making any contact. Will continue to try tomorrow.   Oncology Nurse Navigator Documentation  Oncology Nurse Navigator Flowsheets 08/21/2020  Navigator Location [facility]  Navigator Encounter Type Molecular Studies;Other:  Patient Visit Type MedOnc  Barriers/Navigation Needs Coordination of Care  Interventions Coordination of Care  Acuity Level 2-Minimal Needs (1-2 Barriers Identified)  Coordination of Care Other  Time Spent with Patient 62

## 2020-08-22 ENCOUNTER — Encounter: Payer: Self-pay | Admitting: *Deleted

## 2020-08-22 DIAGNOSIS — J9601 Acute respiratory failure with hypoxia: Secondary | ICD-10-CM | POA: Diagnosis not present

## 2020-08-22 LAB — BASIC METABOLIC PANEL
Anion gap: 7 (ref 5–15)
BUN: 23 mg/dL (ref 8–23)
CO2: 30 mmol/L (ref 22–32)
Calcium: 9.4 mg/dL (ref 8.9–10.3)
Chloride: 99 mmol/L (ref 98–111)
Creatinine, Ser: 1.21 mg/dL (ref 0.61–1.24)
GFR, Estimated: >60 mL/min (ref >60)
Glucose, Bld: 164 mg/dL — ABNORMAL HIGH (ref 70–99)
Potassium: 4.6 mmol/L (ref 3.5–5.1)
Sodium: 136 mmol/L (ref 135–145)

## 2020-08-22 MED ORDER — IPRATROPIUM-ALBUTEROL 0.5-2.5 (3) MG/3ML IN SOLN: 3.0000 mL | Freq: Four times a day (QID) | RESPIRATORY_TRACT | 0 refills | Status: AC | PRN

## 2020-08-22 MED ORDER — DRONABINOL 2.5 MG PO CAPS
2.5000 mg | ORAL_CAPSULE | Freq: Two times a day (BID) | ORAL | 0 refills | Status: AC
Start: 2020-08-22 — End: ?

## 2020-08-22 MED ORDER — ACETAMINOPHEN 325 MG PO TABS: 650.0000 mg | ORAL_TABLET | Freq: Four times a day (QID) | ORAL | Status: AC | PRN

## 2020-08-22 MED ORDER — GUAIFENESIN-DM 100-10 MG/5ML PO SYRP: 5.0000 mL | ORAL_SOLUTION | Freq: Four times a day (QID) | ORAL | 0 refills | Status: AC | PRN

## 2020-08-22 MED ORDER — HEPARIN SOD (PORK) LOCK FLUSH 100 UNIT/ML IV SOLN
500.0000 [IU] | INTRAVENOUS | Status: AC | PRN
  Administered 2020-08-22: 500 [IU]
  Filled 2020-08-22: qty 5

## 2020-08-22 NOTE — Progress Notes (Signed)
Spoke with Dr Marin Olp this morning. Dr Marin Olp spoke to patient when rounding and it has been decided to cease efforts at this time to obtain referral order from the [place]. The patient may request transfer once discharged, but at this time, there are no further actions required.   Oncology Nurse Navigator Documentation  Oncology Nurse Navigator Flowsheets 08/22/2020  Navigator Location [facility]  Navigator Encounter Type Appt/Treatment Plan Review  Patient Visit Type MedOnc  Barriers/Navigation Needs -  Interventions -  Acuity -  Coordination of Care -  Time Spent with Patient 30

## 2020-08-22 NOTE — Discharge Summary (Addendum)
Physician Discharge Summary  Grant Taylor KJZ:[id number] DOB: 1945/11/21 DOA: 08/09/2020  PCP: Grant Mellow, MD  Admit date: 08/09/2020 Discharge date: 08/22/2020  Admitted From: Home  Disposition:  Home   Recommendations for Outpatient Follow-up and new medication changes:  Follow up with Dr. Ronnald Taylor in 7 to 10 days.  Follow up with the [facility], Grant Taylor will be closer to his home. Unfortunately not able to get cancer care with Dr Grant Taylor due to [facility] cover.  Continue Marinol for appetite stimulant.  Follow-up kidney function as an outpatient. Hold losartan for now to prevent hypotension.   Home Health: yes  Equipment/Devices:  home 02 and nebulizer machine   Discharge Condition: stable  CODE STATUS: full  Diet recommendation:  regular.   I spoke over the phone with the patient's daughter about patient's  condition, plan of care, prognosis and all questions were addressed.   Brief/Interim Summary: Grant Taylor was admitted to the hospital with working diagnosis of acute hypoxemic respiratory failure due to community-acquired pneumonia/ pulmonary metastatic disease, complicated with severe sepsis, present on admission. New diagnosis of metastatic pancreatic cancer.    75 year old male past medical history of hypertension, COPD and dyslipidemia who presented with dyspnea.  Recently diagnosed with pancreatic mass July 26, 2020 at the [place] in [place].  The night prior to hospitalization he developed significant cough, associated with dyspnea which was severe enough that prompted him to come to the hospital.  On his initial physical examination his heart rate was 130, blood pressure 150/78, respiratory rate 24, temperature 98.3, oxygen saturation 93%.  His lungs had positive rhonchi and rales, heart S1-S2 present, rhythmic, tachycardic, abdomen was soft nontender, no lower extremity edema.    Sodium 132, potassium 4.1, chloride 98, bicarb 21, glucose 164,  BUN 10, creatinine 1.0, white count 21.2, hemoglobin 11.1, hematocrit 34.5, platelets 355. SARS COVID-19 negative.  EKG 136 bpm, normal axis, normal intervals, sinus rhythm with PACs, no significant ST segment or T wave changes.  Chest radiograph with bilateral interstitial infiltrates, lower lobes and upper lobes, positive vascular congestion.   CT with multiple pulmonary nodules and masses compatible with metastasis. No pulmonary embolism.  Bulky mediastinal and bilateral hilar adenopathy. CT of the abdomen pelvis with large mass involving the pancreatic tail up to 10 cm.  Extension into the left retroperitoneum and possible involvement of the left hip.   Underwent left neck supraclavicular lymph node biopsy.    Patient was placed on bronchodilator therapy, supplemental oxygen, and IV antibiotics. Suspected non cardiogenic pulmonary edema and COPD exacerbation.    Biopsy positive for metastatic pancreatic cancer. Port a Cath was placed. Plan for outpatient follow up with Oncology.  Acute hypoxemic respiratory failure due to community-acquired pneumonia, in the setting of metastatic lung disease, acute COPD exacerbation. (Severe sepsis present on admission).  Patient has significant dyspnea, cough, wheezing and increased sputum production. He was placed on supplemental oxygen along with bronchodilator therapy and IV antibiotics. He completed his antibiotic therapy with good toleration. His dyspnea has improved, but he will continue supplemental oxygen per nasal cannula at home.  Continue bronchodilator therapy and close follow-up as an outpatient.  2.  Pancreatic cancer, metastatic disease.  Left supraclavicular lymph node biopsy showed metastatic poorly differentiated carcinoma with neuroendocrine features. Patient was consulted by oncology. Port-A-Cath was placed in preparation for chemotherapy.  Because the [place] insurance issues unable to get cancer care locally, she will follow-up  with the [place].  3.  Acute kidney injury.  Patient received supportive medical therapy with good toleration. At his discharge his sodium was 136, potassium 4.6, chloride 99, bicarb 30, glucose 164, creatinine 1.21. Will need close outpatient follow-up of kidney function and electrolytes. Hold losartan for now.   4.  Hypertension and dyslipidemia.  Continue blood pressure control with amlodipine. Hold on losartan to prevent hypotension.  Continue statin therapy.  5.  Depression.  Continue bupropion.  6.  Iron deficiency anemia on anemia of chronic disease.  Iron panel showed a serum iron 25, TIBC 221, transferrin saturation 11, ferritin 725.  Discharge Diagnoses:  Principal Problem:   Acute respiratory failure with hypoxia ([place]) Active Problems:   Essential hypertension   COPD (chronic obstructive pulmonary disease) (HCC)   Pancreatic mass   Severe sepsis (HCC)    Discharge Instructions   Allergies as of 08/22/2020       Reactions   Levofloxacin Rash   Other reaction(s): Angioedema   Lisinopril Cough   Other reaction(s): Angioedema   Diclofenac    Other reaction(s): Lip swelling   Dust Mite Extract Nausea And Vomiting   Elemental Sulfur    Etodolac Nausea And Vomiting   Other reaction(s): Facial swelling   No Known Allergies    Other reaction(s): ANGIOEDEMA OF LIPS, Angioedema of tongue, ANGIOEDEMA OF LIPS, Angioedema of tongue   Tolterodine    Citalopram Other (See Comments)   Other reaction(s): Drowsy   Other Rash        Medication List     STOP taking these medications    diclofenac 75 MG EC tablet Commonly known as: VOLTAREN   famotidine 20 MG tablet Commonly known as: PEPCID   losartan 50 MG tablet Commonly known as: COZAAR   potassium chloride SA 20 MEQ tablet Commonly known as: KLOR-CON       TAKE these medications    acetaminophen 325 MG tablet Commonly known as: TYLENOL Take 2 tablets (650 mg total) by mouth every 6 (six) hours as needed  for moderate pain.   albuterol 108 (90 Base) MCG/ACT inhaler Commonly known as: VENTOLIN HFA Inhale into the lungs every 6 (six) hours as needed for wheezing or shortness of breath.   amLODipine 10 MG tablet Commonly known as: NORVASC Take 10 mg by mouth daily.   benzonatate 100 MG capsule Commonly known as: TESSALON Take 100 mg by mouth 3 (three) times daily as needed for cough.   buPROPion 300 MG 24 hr tablet Commonly known as: WELLBUTRIN XL Take 300 mg by mouth daily.   Carboxymethylcellulose Sodium 1 % Gel Place 1 drop into both eyes 2 (two) times daily as needed (dry eyes).   chlorpheniramine 4 MG tablet Commonly known as: CHLOR-TRIMETON Take 4 mg by mouth daily as needed for allergies.   Cholecalciferol 50 MCG (2000 UT) Tabs Take 2,000 Units by mouth daily.   cyclobenzaprine 10 MG tablet Commonly known as: FLEXERIL Take 10 mg by mouth 3 (three) times daily as needed for muscle spasms.   diclofenac Sodium 1 % Gel Commonly known as: VOLTAREN Apply 4 g topically 4 (four) times daily as needed (pain).   docusate sodium 100 MG capsule Commonly known as: COLACE Take 100 mg by mouth daily as needed for mild constipation.   dronabinol 2.5 MG capsule Commonly known as: MARINOL Take 1 capsule (2.5 mg total) by mouth 2 (two) times daily before lunch and supper.   feeding supplement Liqd Take 237 mLs by mouth daily.   fluticasone 50 MCG/ACT nasal spray Commonly known  as: FLONASE Place 1 spray into both nostrils daily as needed for allergies.   gabapentin 300 MG capsule Commonly known as: NEURONTIN Take 300 mg by mouth 3 (three) times daily.   guaifenesin 400 MG Tabs tablet Commonly known as: HUMIBID E Take 400 mg by mouth daily as needed for congestion.   guaiFENesin-dextromethorphan 100-10 MG/5ML syrup Commonly known as: ROBITUSSIN DM Take 5 mLs by mouth every 6 (six) hours as needed for cough (chest congestion).   hydrOXYzine 10 MG tablet Commonly known as:  ATARAX/VISTARIL Take 10 mg by mouth daily as needed for anxiety or sleep.   ipratropium-albuterol 0.5-2.5 (3) MG/3ML Soln Commonly known as: DUONEB Take 3 mLs by nebulization every 6 (six) hours as needed (shortness of breath or wheezing).   loratadine 10 MG tablet Commonly known as: CLARITIN Take 10 mg by mouth daily.   methocarbamol 500 MG tablet Commonly known as: ROBAXIN Take 1 tablet (500 mg total) by mouth every 6 (six) hours as needed for muscle spasms.   omeprazole 40 MG capsule Commonly known as: PRILOSEC Take 40 mg by mouth 2 (two) times daily.   ondansetron 4 MG disintegrating tablet Commonly known as: ZOFRAN-ODT Take 4 mg by mouth 3 (three) times daily as needed for nausea/vomiting.   oxyCODONE 5 MG immediate release tablet Commonly known as: Oxy IR/ROXICODONE Take 5 mg by mouth every 6 (six) hours as needed for pain.   polyethylene glycol 17 g packet Commonly known as: MIRALAX / GLYCOLAX Take 17 g by mouth daily.   pravastatin 80 MG tablet Commonly known as: PRAVACHOL Take 80 mg by mouth daily.   sildenafil 100 MG tablet Commonly known as: VIAGRA Take 100 mg by mouth daily as needed for erectile dysfunction.   sodium chloride inhaler solution Commonly known as: BRONCHO SALINE Take 1 spray by nebulization as needed (shortness of breath).   tiZANidine 4 MG tablet Commonly known as: ZANAFLEX Take 4 mg by mouth 3 (three) times daily as needed for muscle spasms.               Durable Medical Equipment  (From admission, onward)           Start     Ordered   08/22/20 1211  For home use only DME Nebulizer machine  Once       Question Answer Comment  Patient needs a nebulizer to treat with the following condition Bronchitis   Length of Need 12 Months      08/22/20 1210   08/19/20 1257  For home use only DME oxygen  Once       Question Answer Comment  Length of Need Lifetime   Mode or (Route) Nasal cannula   Liters per Minute 4   Frequency  Continuous (stationary and portable oxygen unit needed)   Oxygen conserving device Yes   Oxygen delivery system Gas      08/19/20 1256            Follow-up Information     [facility] Follow up.   Why: [facility] will follow for [place] [phone] office, call if you have any questions or concerns.        [facility] Follow up.   Why: Oxygen is from this company in [place] from [place]. [phone].               Allergies  Allergen Reactions   Levofloxacin Rash    Other reaction(s): Angioedema   Lisinopril Cough    Other  reaction(s): Angioedema   Diclofenac     Other reaction(s): Lip swelling   Dust Mite Extract Nausea And Vomiting   Elemental Sulfur    Etodolac Nausea And Vomiting    Other reaction(s): Facial swelling   No Known Allergies     Other reaction(s): ANGIOEDEMA OF LIPS, Angioedema of tongue, ANGIOEDEMA OF LIPS, Angioedema of tongue   Tolterodine    Citalopram Other (See Comments)    Other reaction(s): Drowsy   Other Rash    Consultations: IR Oncology    Procedures/Studies: DG Chest 2 View  Result Date: 08/13/2020 CLINICAL DATA:  Acute shortness of breath EXAM: CHEST - 2 VIEW COMPARISON:  08/09/2020 FINDINGS: Cardiac shadow is stable. Lungs again demonstrate multiple bilateral nodular densities similar to that seen on the prior exam. Some superimposed acute infiltrate is noted particularly in the right base. Small effusions are noted bilaterally. No bony abnormality is seen. IMPRESSION: Stable metastatic disease bilaterally. New superimposed right basilar infiltrate Electronically Signed   By: Inez Catalina M.D.   On: 08/13/2020 12:53   CT Angio Chest PE W/Cm &/Or Wo Cm  Result Date: 08/09/2020 CLINICAL DATA:  Shortness of breath.  Pancreatic mass. EXAM: CT ANGIOGRAPHY CHEST WITH CONTRAST TECHNIQUE: Multidetector CT imaging of the chest was performed using the standard protocol during bolus administration of intravenous contrast.  Multiplanar CT image reconstructions and MIPs were obtained to evaluate the vascular anatomy. CONTRAST:  14mL OMNIPAQUE IOHEXOL 350 MG/ML SOLN COMPARISON:  None. FINDINGS: Cardiovascular: No filling defects in the pulmonary arteries to suggest pulmonary emboli. Heart is normal size. Aorta is normal caliber. Scattered aortic calcifications. Mediastinum/Nodes: Mediastinal and bilateral hilar adenopathy. No axillary adenopathy. Index prevascular lymph node has a short axis diameter of 2.7 cm. Lungs/Pleura: Extensive innumerable bilateral pulmonary nodules and masses. Index right lower lobe mass on image 111 measures 2.6 cm. Index left lower lobe mass on image 86 measures 2.5 cm. Small left pleural effusion. Upper Abdomen: Probable peritoneal implants in the left upper lobe anterior to the spleen. See abdominal CT report for further discussion. Musculoskeletal: Chest wall soft tissues are unremarkable. No acute bony abnormality. Review of the MIP images confirms the above findings. IMPRESSION: No evidence of pulmonary embolus. Innumerable bilateral pulmonary nodules and masses compatible metastases. Bulky mediastinal and bilateral hilar adenopathy. Small left pleural effusion. Electronically Signed   By: Rolm Baptise M.D.   On: 08/09/2020 08:55   CT Abdomen Pelvis W Contrast  Result Date: 08/09/2020 CLINICAL DATA:  Metastatic disease evaluation.  Pancreatic tumor. EXAM: CT ABDOMEN AND PELVIS WITH CONTRAST TECHNIQUE: Multidetector CT imaging of the abdomen and pelvis was performed using the standard protocol following bolus administration of intravenous contrast. CONTRAST:  172mL OMNIPAQUE IOHEXOL 350 MG/ML SOLN COMPARISON:  Chest CT today.  CT abdomen and pelvis 12/27/2019 FINDINGS: Lower chest: Innumerable masses in the lung bases. Small left pleural effusion. Hepatobiliary: Scattered low-density lesions throughout the liver which do not appear to be cystic and are concerning for metastases. Gallbladder  unremarkable. Pancreas: Large mass in the pancreatic tail measures up to approximately 10 cm with extension anterior to the spleen and possible involvement of the anterior upper pole of the left kidney. Spleen: No focal abnormality.  Normal size. Adrenals/Urinary Tract: Tumor from the pancreatic tail appears to extend to and possibly involve the anterior to mid pole of the left kidney. No hydronephrosis. Right adrenal gland unremarkable. Left adrenal gland not visualized and likely encased by the pancreatic tumor. Urinary bladder unremarkable. Stomach/Bowel: Pancreatic tumor abuts the  proximal descending thoracic aorta at the splenic flexure. No evidence of bowel obstruction. Vascular/Lymphatic: Aortic atherosclerosis. Large left periaortic lymph node with a short axis diameter of 3.3 cm. Other enlarged upper abdominal retroperitoneal lymph nodes. Reproductive: No visible focal abnormality. Other: Small amount of free fluid in the pelvis. Musculoskeletal: No acute bony abnormality. IMPRESSION: Large mass involving the pancreatic tail measuring up to 10 cm with extension in the left retroperitoneum with possible involvement of the left kidney. Mass extends anterior to the spleen. Retroperitoneal adenopathy. Low-density lesions in the liver, likely metastases. Innumerable masses in the pulmonary bases compatible with metastases. Small left pleural effusion. Small amount of free fluid in the pelvis. Aortic atherosclerosis. Electronically Signed   By: Rolm Baptise M.D.   On: 08/09/2020 08:59   DG CHEST PORT 1 VIEW  Result Date: 08/18/2020 CLINICAL DATA:  Worsening cough. EXAM: PORTABLE CHEST 1 VIEW COMPARISON:  08/13/2020 FINDINGS: 0841 hours. Peripheral airspace disease seen on the previous x-ray appears improved in the interval. There are persistent numeral bilateral pulmonary nodules with a mid and lower lung predominance. No substantial pleural effusion. Interstitial markings are diffusely coarsened with chronic  features. Cardiopericardial silhouette is at upper limits of normal for size. Telemetry leads overlie the chest. IMPRESSION: 1. Interval improvement in peripheral airspace disease seen on the previous x-ray. 2. Persistent widespread bilateral pulmonary nodules compatible with metastatic disease. Electronically Signed   By: Misty Stanley M.D.   On: 08/18/2020 10:36   DG Chest Portable 1 View  Result Date: 08/09/2020 CLINICAL DATA:  75 year old male with shortness of breath. History of pancreatic tumor. EXAM: PORTABLE CHEST 1 VIEW COMPARISON:  No priors. FINDINGS: Widespread areas of interstitial prominence are noted throughout the lungs bilaterally, most evident in the mid to lower lungs, strongly suggestive of interstitial lung disease. Lung volumes are normal. In addition, there are extensive areas of nodularity throughout the lungs bilaterally also most evident throughout the mid to lower lungs, concerning for widespread metastatic disease. Small left pleural effusion. No definite right pleural effusion. No pneumothorax. No evidence of pulmonary edema. Heart size is normal. Upper mediastinal contours are within normal limits. Orthopedic fixation hardware in the lower cervical spine incompletely imaged. IMPRESSION: 1. The appearance the chest suggest widespread metastatic disease to the lungs, superimposed upon a background of chronic interstitial lung disease, as above. 2. Small left pleural effusion. Electronically Signed   By: Vinnie Langton M.D.   On: 08/09/2020 07:16   ECHOCARDIOGRAM COMPLETE  Result Date: 08/09/2020    ECHOCARDIOGRAM REPORT   Patient Name:   BRITTEN SEYFRIED Date of Exam: 08/09/2020 Medical Rec #:  [id number]                Height:       67.0 in Accession #:    [id number]               Weight:       140.7 lb Date of Birth:  06-11-1945                BSA:          1.741 m Patient Age:    36 years                 BP:           136/96 mmHg Patient Gender: M                         HR:  111 bpm. Exam Location:  Inpatient Procedure: 2D Echo, Cardiac Doppler and Color Doppler Indications:    Dyspnea  History:        Patient has no prior history of Echocardiogram examinations.  Sonographer:    Merrie Roof RDCS Referring Phys: [place]  1. Left ventricular ejection fraction, by estimation, is 55 to 60%. The left ventricle has normal function. The left ventricle has no regional wall motion abnormalities. Left ventricular diastolic parameters are consistent with Grade I diastolic dysfunction (impaired relaxation).  2. Right ventricular systolic function is normal. The right ventricular size is normal. There is normal pulmonary artery systolic pressure.  3. The mitral valve is normal in structure. Trivial mitral valve regurgitation. No evidence of mitral stenosis.  4. The aortic valve is tricuspid. There is mild calcification of the aortic valve. There is mild thickening of the aortic valve. Aortic valve regurgitation is not visualized. Mild to moderate aortic valve sclerosis/calcification is present, without any evidence of aortic stenosis.  5. The inferior vena cava is normal in size with greater than 50% respiratory variability, suggesting right atrial pressure of 3 mmHg. Comparison(s): No prior Echocardiogram. FINDINGS  Left Ventricle: Left ventricular ejection fraction, by estimation, is 55 to 60%. The left ventricle has normal function. The left ventricle has no regional wall motion abnormalities. The left ventricular internal cavity size was normal in size. There is  borderline concentric left ventricular hypertrophy. Left ventricular diastolic parameters are consistent with Grade I diastolic dysfunction (impaired relaxation). Normal left ventricular filling pressure. Right Ventricle: The right ventricular size is normal. No increase in right ventricular wall thickness. Right ventricular systolic function is normal. There is normal pulmonary artery systolic  pressure. The tricuspid regurgitant velocity is 2.80 m/s, and  with an assumed right atrial pressure of 3 mmHg, the estimated right ventricular systolic pressure is 06.2 mmHg. Left Atrium: Left atrial size was normal in size. Right Atrium: Right atrial size was normal in size. Pericardium: There is no evidence of pericardial effusion. Mitral Valve: The mitral valve is normal in structure. There is mild thickening of the mitral valve leaflet(s). There is mild calcification of the mitral valve leaflet(s). Mild mitral annular calcification. Trivial mitral valve regurgitation. No evidence  of mitral valve stenosis. Tricuspid Valve: The tricuspid valve is normal in structure. Tricuspid valve regurgitation is trivial. Aortic Valve: The aortic valve is tricuspid. There is mild calcification of the aortic valve. There is mild thickening of the aortic valve. Aortic valve regurgitation is not visualized. Mild to moderate aortic valve sclerosis/calcification is present, without any evidence of aortic stenosis. Pulmonic Valve: The pulmonic valve was normal in structure. Pulmonic valve regurgitation is trivial. Aorta: The aortic root and ascending aorta are structurally normal, with no evidence of dilitation. Venous: The inferior vena cava is normal in size with greater than 50% respiratory variability, suggesting right atrial pressure of 3 mmHg. IAS/Shunts: No atrial level shunt detected by color flow Doppler.  LEFT VENTRICLE PLAX 2D LVIDd:         2.97 cm  Diastology LVIDs:         2.18 cm  LV e' medial:  8.05 cm/s LV PW:         1.06 cm  LV e' lateral: 8.59 cm/s LV IVS:        0.87 cm LVOT diam:     1.90 cm LV SV:         66 LV SV Index:   38 LVOT Area:  2.84 cm  RIGHT VENTRICLE RV Basal diam:  3.60 cm LEFT ATRIUM             Index       RIGHT ATRIUM           Index LA diam:        2.80 cm 1.61 cm/m  RA Area:     16.40 cm LA Vol (A2C):   49.4 ml 28.37 ml/m RA Volume:   37.50 ml  21.54 ml/m LA Vol (A4C):   57.5 ml  33.02 ml/m LA Biplane Vol: 53.2 ml 30.55 ml/m  AORTIC VALVE LVOT Vmax:   154.00 cm/s LVOT Vmean:  113.000 cm/s LVOT VTI:    0.232 m  AORTA Ao Root diam: 3.10 cm Ao Asc diam:  3.50 cm TRICUSPID VALVE TR Peak grad:   31.4 mmHg TR Vmax:        280.00 cm/s  SHUNTS Systemic VTI:  0.23 m Systemic Diam: 1.90 cm Gwyndolyn Kaufman MD Electronically signed by Gwyndolyn Kaufman MD Signature Date/Time: 08/09/2020/5:00:32 PM    Final    [place] CORE BIOPSY (LYMPH NODES)  Result Date: 08/13/2020 INDICATION: Pancreatic mass, liver lesions, pulmonary masses and lymphadenopathy including palpable enlarged left supraclavicular lymph node. EXAM: ULTRASOUND GUIDED CORE BIOPSY OF LEFT SUPRACLAVICULAR LYMPH NODE MEDICATIONS: None. ANESTHESIA/SEDATION: Fentanyl 50 mcg IV; Versed 1.0 mg IV Moderate Sedation Time:  10 minutes. The patient was continuously monitored during the procedure by the interventional radiology nurse under my direct supervision. PROCEDURE: The procedure, risks, benefits, and alternatives were explained to the patient. Questions regarding the procedure were encouraged and answered. The patient understands and consents to the procedure. A time-out was performed prior to initiating the procedure. The left neck was prepped with chlorhexidine in a sterile fashion, and a sterile drape was applied covering the operative field. A sterile gown and sterile gloves were used for the procedure. Local anesthesia was provided with 1% Lidocaine. After localizing abnormal enlarged left lower neck/supraclavicular lymph nodes, core biopsy samples were obtained with an 18 gauge core biopsy device. Four samples were obtained and submitted in formalin. COMPLICATIONS: None immediate. FINDINGS: Enlarged lymph node mass in the left supraclavicular region measures at least 5.2 cm in greatest diameter. Solid tissue was obtained. IMPRESSION: Ultrasound-guided core biopsy performed of enlarged lymph node mass in the left supraclavicular region.  Electronically Signed   By: Aletta Edouard M.D.   On: 08/13/2020 09:19   IR IMAGING GUIDED PORT INSERTION  Result Date: 08/21/2020 INDICATION: 75 year old with metastatic pancreatic cancer. Port-A-Cath needed for therapy. EXAM: FLUOROSCOPIC AND ULTRASOUND GUIDED PLACEMENT OF A SUBCUTANEOUS PORT COMPARISON:  None. MEDICATIONS: Moderate sedation ANESTHESIA/SEDATION: Versed 2.0 mg IV; Fentanyl 100 mcg IV; Moderate Sedation Time:  26 minutes The patient was continuously monitored during the procedure by the interventional radiology nurse under my direct supervision. FLUOROSCOPY TIME:  54 seconds, 4 mGy COMPLICATIONS: None immediate. PROCEDURE: The procedure, risks, benefits, and alternatives were explained to the patient. Questions regarding the procedure were encouraged and answered. The patient understands and consents to the procedure. Patient was placed supine on the interventional table. Ultrasound confirmed a patent right internal jugular vein. Ultrasound image was saved for documentation. The right chest and neck were cleaned with a skin antiseptic and a sterile drape was placed. Maximal barrier sterile technique was utilized including caps, mask, sterile gowns, sterile gloves, sterile drape, hand hygiene and skin antiseptic. The right neck was anesthetized with 1% lidocaine. Small incision was made in the right neck with a  blade. Micropuncture set was placed in the right internal jugular vein with ultrasound guidance. The micropuncture wire was used for measurement purposes. The right chest was anesthetized with 1% lidocaine with epinephrine. #15 blade was used to make an incision and a subcutaneous port pocket was formed. Fowler was assembled. Subcutaneous tunnel was formed with a stiff tunneling device. The port catheter was brought through the subcutaneous tunnel. The port was placed in the subcutaneous pocket. The micropuncture set was exchanged for a peel-away sheath. The catheter was  placed through the peel-away sheath and the tip was positioned at the superior cavoatrial junction. Catheter placement was confirmed with fluoroscopy. The port was accessed and flushed with saline. The port pocket was closed using two layers of absorbable sutures and Dermabond. The vein skin site was closed using a single layer of absorbable suture and Dermabond. Port-A-Cath was left accessed at the end of the procedure and flushed with normal saline. Sterile dressings were applied. Patient tolerated the procedure well without an immediate complication. Ultrasound and fluoroscopic images were taken and saved for this procedure. IMPRESSION: Placement of a subcutaneous port device. Catheter tip is positioned at the superior cavoatrial junction. Electronically Signed   By: Markus Daft M.D.   On: 08/21/2020 12:23     Procedures: left  supraclavicular lymph node biopsy   Subjective: Patient is feeling better, no nausea or vomiting, dyspnea has improved, continue to be weak.   Discharge Exam: Vitals:   08/22/20 0451 08/22/20 0751  BP: 122/85   Pulse: (!) 105   Resp:    Temp: 98 F (36.7 C)   SpO2: 95% 97%   Vitals:   08/21/20 1414 08/21/20 2123 08/22/20 0451 08/22/20 0751  BP:  132/85 122/85   Pulse:  91 (!) 105   Resp:      Temp:  98.6 F (37 C) 98 F (36.7 C)   TempSrc:  Oral Axillary   SpO2: 90% 91% 95% 97%  Weight:      Height:        General: Not in pain, deconditioned  Neurology: Awake and alert, non focal  E ENT: mild pallor, no icterus, oral mucosa moist Cardiovascular: No JVD. S1-S2 present, rhythmic, no gallops, rubs, or murmurs. No lower extremity edema. Pulmonary:  positive breath sounds bilaterally, adequate air movement, no wheezing, rhonchi or rales. Gastrointestinal. Abdomen soft and non tender Skin. No rashes Musculoskeletal: no joint deformities   The results of significant diagnostics from this hospitalization (including imaging, microbiology, ancillary and  laboratory) are listed below for reference.     Microbiology: No results found for this or any previous visit (from the past 240 hour(s)).   Labs: BNP (last 3 results) Recent Labs    08/09/20 0637  BNP 622.2*   Basic Metabolic Panel: Recent Labs  Lab 08/16/20 0334 08/17/20 0322 08/21/20 0723 08/22/20 0309  NA 136 138 135 136  K 4.5 4.6 4.8 4.6  CL 103 102 96* 99  CO2 25 27 25 30   GLUCOSE 168* 138* 143* 164*  BUN 15 15 20 23   CREATININE 0.82 0.84 1.20 1.21  CALCIUM 9.1 9.3 9.9 9.4   Liver Function Tests: Recent Labs  Lab 08/17/20 0322 08/21/20 0723  AST 55* 60*  ALT 59* 50*  ALKPHOS 86 83  BILITOT 0.2* 0.3  PROT 6.8 7.1  ALBUMIN 2.6* 2.9*   No results for input(s): LIPASE, AMYLASE in the last 168 hours. No results for input(s): AMMONIA in the last 168 hours.  CBC: Recent Labs  Lab 08/16/20 0334 08/17/20 0322 08/21/20 0723  WBC 8.2 9.3 15.5*  NEUTROABS 7.1  --  13.1*  HGB 9.7* 10.1* 12.2*  HCT 31.5* 32.2* 39.5  MCV 84.2 83.6 84.9  PLT 478* 538* 622*   Cardiac Enzymes: No results for input(s): CKTOTAL, CKMB, CKMBINDEX, TROPONINI in the last 168 hours. BNP: Invalid input(s): POCBNP CBG: No results for input(s): GLUCAP in the last 168 hours. D-Dimer No results for input(s): DDIMER in the last 72 hours. Hgb A1c No results for input(s): HGBA1C in the last 72 hours. Lipid Profile No results for input(s): CHOL, HDL, LDLCALC, TRIG, CHOLHDL, LDLDIRECT in the last 72 hours. Thyroid function studies No results for input(s): TSH, T4TOTAL, T3FREE, THYROIDAB in the last 72 hours.  Invalid input(s): FREET3 Anemia work up No results for input(s): VITAMINB12, FOLATE, FERRITIN, TIBC, IRON, RETICCTPCT in the last 72 hours. Urinalysis    Component Value Date/Time   COLORURINE YELLOW 12/27/2019 1548   APPEARANCEUR CLOUDY (A) 12/27/2019 1548   LABSPEC 1.012 12/27/2019 1548   PHURINE 7.0 12/27/2019 1548   GLUCOSEU NEGATIVE 12/27/2019 1548   HGBUR SMALL (A)  12/27/2019 1548   BILIRUBINUR NEGATIVE 12/27/2019 1548   KETONESUR NEGATIVE 12/27/2019 1548   PROTEINUR NEGATIVE 12/27/2019 1548   NITRITE NEGATIVE 12/27/2019 1548   LEUKOCYTESUR NEGATIVE 12/27/2019 1548   Sepsis Labs Invalid input(s): PROCALCITONIN,  WBC,  LACTICIDVEN Microbiology No results found for this or any previous visit (from the past 240 hour(s)).   Time coordinating discharge: 45 minutes  SIGNED:   Tawni Millers, MD  [facility] 08/22/2020, 11:54 AM

## 2020-08-22 NOTE — Plan of Care (Signed)
  Problem: Health Behavior/Discharge Planning: Goal: Ability to manage health-related needs will improve Outcome: Progressing   Problem: Clinical Measurements: Goal: Diagnostic test results will improve Outcome: Progressing Goal: Respiratory complications will improve Outcome: Progressing

## 2020-08-22 NOTE — Progress Notes (Signed)
This sounds like Grant Taylor will be going home today.  He says her oxygen was delivered to his house.  Unfortunately, we spoke to the [facility] hospital.  They will not let us treat him for his pancreatic cancer.  They said that they will have everything set up for him.  I just hate that for him.  Hopefully he can get treatment in [place] which would be a whole lot more convenient for him.  I would hate that he would have to go down to [facility] for treatment as this is about an hour or so away.  He did have his Port-A-Cath put in yesterday.  Hopefully, this will expedite the treatment for his cancer.  His labs yesterday showed a white cell count of 15.5.  Hemoglobin 12.2.  Platelet count 622,000.  His appetite might be a little better.  If he goes home, he MUST have the Marinol.  I will know this will have to go through the [facility] hospital for him to get.  I think this is incredibly important for him for his appetite.  His prealbumin last week was 14.1.  This is adequate but it would be nice if this was higher.  His vital signs show temperature of 98.  Pulse 105.  Blood pressure 122/85.  His lungs sounded pretty clear bilaterally.  Cardiac exam regular rate and rhythm.  Abdomen is soft.  Bowel sounds are present.  There is no abdominal mass.  Extremity shows no clubbing, cyanosis or edema.  Again, Grant Taylor will be going home today.  [facility]  Please, somehow, notify them that he has been discharged today.  Again, please make sure that he has a paper prescription for Marinol so that he can go to the [place] and obtain this.  If there is any thing that we can do for him or if the [place] changes there mind with respect to [place] treating him in the community, we will be when happy to help out.  Of note, we are sending his tumor off for molecular markers so we will be sending additional information as to any specific therapy that might be incorporated.  I very much  appreciate the outstanding care that he is got from all staff of on [place], MD  2 [facility]

## 2020-08-23 ENCOUNTER — Encounter: Payer: Self-pay | Admitting: *Deleted

## 2020-08-23 NOTE — Progress Notes (Signed)
Received request from [employer] to obtain referral from [place] for the oncomap testing. Since we are no longer the treating provider, and a referral will not be able to be obtained, [employer] was called and order was cancelled.   Oncology Nurse Navigator Documentation  Oncology Nurse Navigator Flowsheets 08/23/2020  Navigator Location [facility]  Navigator Encounter Type Molecular Studies  Patient Visit Type -  Barriers/Navigation Needs -  Interventions -  Acuity -  Coordination of Care -  Time Spent with Patient 30

## 2020-08-24 ENCOUNTER — Other Ambulatory Visit: Payer: Self-pay

## 2020-08-24 ENCOUNTER — Encounter (HOSPITAL_COMMUNITY): Payer: Self-pay | Admitting: Internal Medicine

## 2020-08-24 ENCOUNTER — Emergency Department (HOSPITAL_COMMUNITY): Payer: No Typology Code available for payment source

## 2020-08-24 ENCOUNTER — Inpatient Hospital Stay (HOSPITAL_COMMUNITY)
Admission: EM | Admit: 2020-08-24 | Discharge: 2020-09-26 | DRG: 177 | Disposition: E | Payer: No Typology Code available for payment source | Attending: Family Medicine | Admitting: Family Medicine

## 2020-08-24 DIAGNOSIS — E871 Hypo-osmolality and hyponatremia: Secondary | ICD-10-CM | POA: Diagnosis present

## 2020-08-24 DIAGNOSIS — Z20822 Contact with and (suspected) exposure to covid-19: Secondary | ICD-10-CM | POA: Diagnosis present

## 2020-08-24 DIAGNOSIS — C259 Malignant neoplasm of pancreas, unspecified: Secondary | ICD-10-CM | POA: Diagnosis present

## 2020-08-24 DIAGNOSIS — Z515 Encounter for palliative care: Secondary | ICD-10-CM

## 2020-08-24 DIAGNOSIS — Z79899 Other long term (current) drug therapy: Secondary | ICD-10-CM

## 2020-08-24 DIAGNOSIS — L8990 Pressure ulcer of unspecified site, unspecified stage: Secondary | ICD-10-CM | POA: Insufficient documentation

## 2020-08-24 DIAGNOSIS — E43 Unspecified severe protein-calorie malnutrition: Secondary | ICD-10-CM | POA: Insufficient documentation

## 2020-08-24 DIAGNOSIS — R079 Chest pain, unspecified: Secondary | ICD-10-CM | POA: Diagnosis not present

## 2020-08-24 DIAGNOSIS — D6959 Other secondary thrombocytopenia: Secondary | ICD-10-CM | POA: Diagnosis not present

## 2020-08-24 DIAGNOSIS — R54 Age-related physical debility: Secondary | ICD-10-CM | POA: Diagnosis present

## 2020-08-24 DIAGNOSIS — C7802 Secondary malignant neoplasm of left lung: Secondary | ICD-10-CM | POA: Diagnosis present

## 2020-08-24 DIAGNOSIS — Y95 Nosocomial condition: Secondary | ICD-10-CM | POA: Diagnosis present

## 2020-08-24 DIAGNOSIS — R042 Hemoptysis: Secondary | ICD-10-CM | POA: Diagnosis not present

## 2020-08-24 DIAGNOSIS — D701 Agranulocytosis secondary to cancer chemotherapy: Secondary | ICD-10-CM | POA: Diagnosis not present

## 2020-08-24 DIAGNOSIS — C3490 Malignant neoplasm of unspecified part of unspecified bronchus or lung: Secondary | ICD-10-CM

## 2020-08-24 DIAGNOSIS — E538 Deficiency of other specified B group vitamins: Secondary | ICD-10-CM | POA: Diagnosis present

## 2020-08-24 DIAGNOSIS — Z6821 Body mass index (BMI) 21.0-21.9, adult: Secondary | ICD-10-CM

## 2020-08-24 DIAGNOSIS — F32A Depression, unspecified: Secondary | ICD-10-CM

## 2020-08-24 DIAGNOSIS — J189 Pneumonia, unspecified organism: Secondary | ICD-10-CM | POA: Diagnosis present

## 2020-08-24 DIAGNOSIS — J440 Chronic obstructive pulmonary disease with acute lower respiratory infection: Secondary | ICD-10-CM | POA: Diagnosis not present

## 2020-08-24 DIAGNOSIS — N179 Acute kidney failure, unspecified: Secondary | ICD-10-CM | POA: Diagnosis not present

## 2020-08-24 DIAGNOSIS — E785 Hyperlipidemia, unspecified: Secondary | ICD-10-CM | POA: Diagnosis present

## 2020-08-24 DIAGNOSIS — R739 Hyperglycemia, unspecified: Secondary | ICD-10-CM

## 2020-08-24 DIAGNOSIS — I10 Essential (primary) hypertension: Secondary | ICD-10-CM | POA: Diagnosis present

## 2020-08-24 DIAGNOSIS — E1165 Type 2 diabetes mellitus with hyperglycemia: Secondary | ICD-10-CM | POA: Diagnosis not present

## 2020-08-24 DIAGNOSIS — J9621 Acute and chronic respiratory failure with hypoxia: Secondary | ICD-10-CM | POA: Diagnosis present

## 2020-08-24 DIAGNOSIS — R0603 Acute respiratory distress: Secondary | ICD-10-CM

## 2020-08-24 DIAGNOSIS — J439 Emphysema, unspecified: Secondary | ICD-10-CM | POA: Diagnosis present

## 2020-08-24 DIAGNOSIS — R0902 Hypoxemia: Secondary | ICD-10-CM

## 2020-08-24 DIAGNOSIS — Z881 Allergy status to other antibiotic agents status: Secondary | ICD-10-CM

## 2020-08-24 DIAGNOSIS — L89159 Pressure ulcer of sacral region, unspecified stage: Secondary | ICD-10-CM | POA: Diagnosis present

## 2020-08-24 DIAGNOSIS — C7801 Secondary malignant neoplasm of right lung: Secondary | ICD-10-CM | POA: Diagnosis present

## 2020-08-24 DIAGNOSIS — J158 Pneumonia due to other specified bacteria: Principal | ICD-10-CM | POA: Diagnosis present

## 2020-08-24 DIAGNOSIS — J449 Chronic obstructive pulmonary disease, unspecified: Secondary | ICD-10-CM | POA: Diagnosis not present

## 2020-08-24 DIAGNOSIS — T451X5A Adverse effect of antineoplastic and immunosuppressive drugs, initial encounter: Secondary | ICD-10-CM | POA: Diagnosis not present

## 2020-08-24 DIAGNOSIS — R64 Cachexia: Secondary | ICD-10-CM | POA: Diagnosis present

## 2020-08-24 DIAGNOSIS — Z87891 Personal history of nicotine dependence: Secondary | ICD-10-CM

## 2020-08-24 DIAGNOSIS — G4733 Obstructive sleep apnea (adult) (pediatric): Secondary | ICD-10-CM

## 2020-08-24 DIAGNOSIS — D72829 Elevated white blood cell count, unspecified: Secondary | ICD-10-CM

## 2020-08-24 DIAGNOSIS — R0602 Shortness of breath: Secondary | ICD-10-CM | POA: Diagnosis present

## 2020-08-24 DIAGNOSIS — Z66 Do not resuscitate: Secondary | ICD-10-CM | POA: Diagnosis not present

## 2020-08-24 DIAGNOSIS — D709 Neutropenia, unspecified: Secondary | ICD-10-CM

## 2020-08-24 DIAGNOSIS — T380X5A Adverse effect of glucocorticoids and synthetic analogues, initial encounter: Secondary | ICD-10-CM | POA: Diagnosis not present

## 2020-08-24 DIAGNOSIS — C801 Malignant (primary) neoplasm, unspecified: Secondary | ICD-10-CM

## 2020-08-24 DIAGNOSIS — Z888 Allergy status to other drugs, medicaments and biological substances status: Secondary | ICD-10-CM

## 2020-08-24 DIAGNOSIS — F419 Anxiety disorder, unspecified: Secondary | ICD-10-CM | POA: Diagnosis present

## 2020-08-24 DIAGNOSIS — K8689 Other specified diseases of pancreas: Secondary | ICD-10-CM | POA: Diagnosis not present

## 2020-08-24 DIAGNOSIS — I4891 Unspecified atrial fibrillation: Secondary | ICD-10-CM | POA: Diagnosis not present

## 2020-08-24 DIAGNOSIS — C787 Secondary malignant neoplasm of liver and intrahepatic bile duct: Secondary | ICD-10-CM | POA: Diagnosis present

## 2020-08-24 DIAGNOSIS — D696 Thrombocytopenia, unspecified: Secondary | ICD-10-CM

## 2020-08-24 DIAGNOSIS — E875 Hyperkalemia: Secondary | ICD-10-CM | POA: Diagnosis not present

## 2020-08-24 DIAGNOSIS — J9620 Acute and chronic respiratory failure, unspecified whether with hypoxia or hypercapnia: Secondary | ICD-10-CM

## 2020-08-24 DIAGNOSIS — R627 Adult failure to thrive: Secondary | ICD-10-CM | POA: Diagnosis present

## 2020-08-24 DIAGNOSIS — K219 Gastro-esophageal reflux disease without esophagitis: Secondary | ICD-10-CM | POA: Diagnosis present

## 2020-08-24 DIAGNOSIS — N400 Enlarged prostate without lower urinary tract symptoms: Secondary | ICD-10-CM | POA: Diagnosis present

## 2020-08-24 DIAGNOSIS — L89812 Pressure ulcer of head, stage 2: Secondary | ICD-10-CM | POA: Diagnosis not present

## 2020-08-24 LAB — RESP PANEL BY RT-PCR (FLU A&B, COVID) ARPGX2
Influenza A by PCR: NEGATIVE
Influenza B by PCR: NEGATIVE
SARS Coronavirus 2 by RT PCR: NEGATIVE

## 2020-08-24 LAB — CBC WITH DIFFERENTIAL/PLATELET
Abs Immature Granulocytes: 0.13 10*3/uL — ABNORMAL HIGH (ref 0.00–0.07)
Basophils Absolute: 0 10*3/uL (ref 0.0–0.1)
Basophils Relative: 0 %
Eosinophils Absolute: 0 10*3/uL (ref 0.0–0.5)
Eosinophils Relative: 0 %
HCT: 34.1 % — ABNORMAL LOW (ref 39.0–52.0)
Hemoglobin: 11.1 g/dL — ABNORMAL LOW (ref 13.0–17.0)
Immature Granulocytes: 1 %
Lymphocytes Relative: 4 %
Lymphs Abs: 0.9 10*3/uL (ref 0.7–4.0)
MCH: 27 pg (ref 26.0–34.0)
MCHC: 32.6 g/dL (ref 30.0–36.0)
MCV: 83 fL (ref 80.0–100.0)
Monocytes Absolute: 1.7 10*3/uL — ABNORMAL HIGH (ref 0.1–1.0)
Monocytes Relative: 8 %
Neutro Abs: 19.6 10*3/uL — ABNORMAL HIGH (ref 1.7–7.7)
Neutrophils Relative %: 87 %
Platelets: 437 10*3/uL — ABNORMAL HIGH (ref 150–400)
RBC: 4.11 MIL/uL — ABNORMAL LOW (ref 4.22–5.81)
RDW: 16.9 % — ABNORMAL HIGH (ref 11.5–15.5)
WBC: 22.4 10*3/uL — ABNORMAL HIGH (ref 4.0–10.5)
nRBC: 0 % (ref 0.0–0.2)

## 2020-08-24 LAB — URINALYSIS, ROUTINE W REFLEX MICROSCOPIC
Bacteria, UA: NONE SEEN
Bilirubin Urine: NEGATIVE
Glucose, UA: NEGATIVE mg/dL
Hgb urine dipstick: NEGATIVE
Ketones, ur: NEGATIVE mg/dL
Leukocytes,Ua: NEGATIVE
Nitrite: NEGATIVE
Protein, ur: 30 mg/dL — AB
Specific Gravity, Urine: >1.046 — ABNORMAL HIGH (ref 1.005–1.030)
pH: 7 (ref 5.0–8.0)

## 2020-08-24 LAB — COMPREHENSIVE METABOLIC PANEL
ALT: 50 U/L — ABNORMAL HIGH (ref 0–44)
AST: 85 U/L — ABNORMAL HIGH (ref 15–41)
Albumin: 2 g/dL — ABNORMAL LOW (ref 3.5–5.0)
Alkaline Phosphatase: 93 U/L (ref 38–126)
Anion gap: 8 (ref 5–15)
BUN: 19 mg/dL (ref 8–23)
CO2: 27 mmol/L (ref 22–32)
Calcium: 8.7 mg/dL — ABNORMAL LOW (ref 8.9–10.3)
Chloride: 95 mmol/L — ABNORMAL LOW (ref 98–111)
Creatinine, Ser: 1.22 mg/dL (ref 0.61–1.24)
GFR, Estimated: >60 mL/min (ref >60)
Glucose, Bld: 124 mg/dL — ABNORMAL HIGH (ref 70–99)
Potassium: 4.4 mmol/L (ref 3.5–5.1)
Sodium: 130 mmol/L — ABNORMAL LOW (ref 135–145)
Total Bilirubin: 0.6 mg/dL (ref 0.3–1.2)
Total Protein: 6.3 g/dL — ABNORMAL LOW (ref 6.5–8.1)

## 2020-08-24 LAB — TROPONIN I (HIGH SENSITIVITY)
Troponin I (High Sensitivity): 16 ng/L (ref <18)
Troponin I (High Sensitivity): 18 ng/L — ABNORMAL HIGH (ref <18)

## 2020-08-24 LAB — LACTIC ACID, PLASMA
Lactic Acid, Venous: 1.1 mmol/L (ref 0.5–1.9)
Lactic Acid, Venous: 1.4 mmol/L (ref 0.5–1.9)

## 2020-08-24 LAB — BRAIN NATRIURETIC PEPTIDE: B Natriuretic Peptide: 59.1 pg/mL (ref 0.0–100.0)

## 2020-08-24 MED ORDER — CHLORHEXIDINE GLUCONATE CLOTH 2 % EX PADS
6.0000 | MEDICATED_PAD | Freq: Every day | CUTANEOUS | Status: DC
  Administered 2020-08-26 – 2020-09-09 (×16): 6 via TOPICAL

## 2020-08-24 MED ORDER — IOHEXOL 350 MG/ML SOLN
75.0000 mL | Freq: Once | INTRAVENOUS | Status: AC | PRN
  Administered 2020-08-24: 75 mL via INTRAVENOUS

## 2020-08-24 MED ORDER — PIPERACILLIN-TAZOBACTAM 3.375 G IVPB 30 MIN
3.3750 g | Freq: Once | INTRAVENOUS | Status: AC
  Administered 2020-08-24: 3.375 g via INTRAVENOUS
  Filled 2020-08-24: qty 50

## 2020-08-24 MED ORDER — SODIUM CHLORIDE 0.9% FLUSH
10.0000 mL | Freq: Two times a day (BID) | INTRAVENOUS | Status: DC
Start: 2020-08-24 — End: 2020-09-12
  Administered 2020-08-24 – 2020-08-29 (×7): 10 mL
  Administered 2020-08-30: 13 mL
  Administered 2020-08-30: 10 mL
  Administered 2020-08-31: 20 mL
  Administered 2020-08-31 – 2020-09-01 (×2): 13 mL
  Administered 2020-09-01 – 2020-09-08 (×14): 10 mL
  Administered 2020-09-08: 20 mL
  Administered 2020-09-09 – 2020-09-11 (×6): 10 mL

## 2020-08-24 MED ORDER — SODIUM CHLORIDE 0.9% FLUSH: 10.0000 mL | INTRAVENOUS | Status: DC | PRN

## 2020-08-24 MED ORDER — VANCOMYCIN HCL 1250 MG/250ML IV SOLN
1250.0000 mg | Freq: Once | INTRAVENOUS | Status: AC
  Administered 2020-08-24: 1250 mg via INTRAVENOUS
  Filled 2020-08-24: qty 250

## 2020-08-24 MED ORDER — SODIUM CHLORIDE 0.9 % IV SOLN
2.0000 g | Freq: Two times a day (BID) | INTRAVENOUS | Status: DC
  Administered 2020-08-24 – 2020-08-28 (×7): 2 g via INTRAVENOUS
  Filled 2020-08-24 (×9): qty 2

## 2020-08-24 MED ORDER — LACTATED RINGERS IV SOLN: INTRAVENOUS | Status: DC

## 2020-08-24 NOTE — Progress Notes (Signed)
Patient just d/c'd from [facility] 2 days ago with plans to get chemotherapy at the [facility].  Based on the d/c summary not able to get cancer care with Dr Marin Olp due to [facility] cover.   Family brought in for worsening O2 needs:  CTA shows: Interval development of patchy consolidative opacities throughout the right lung which may represent superimposed infectious process or potentially lymphangitic spread of tumor.  He may be better served if he could be transferred to the [place] when his cancer could possibly be treated.  I am concerned if his treatment is delayed for too long he may become to weak for treatment.  Dr. Tyrone Nine to get SW/CM involved and see if transfer would be possible.  If not, he will call back for admission. Eulogio Bear DO

## 2020-08-24 NOTE — ED Triage Notes (Signed)
Pt BIB EMS for c/o [facility] since last night; worse today. Denies CP or N/V. Dx with lung and pancreatic CA on 7/1. Has a large mass on the right side and a collapsed lung on the left. Also c/o bil.flank and lower back pain. 324 ASA given in route. NRB present on arrival with no respiratory distress at this time.

## 2020-08-24 NOTE — ED Notes (Signed)
Pt O2 sat dropped to 86% on room air when taken off of O2. Pt is back on NRB @ 5L.

## 2020-08-24 NOTE — ED Notes (Signed)
Pt returned from CT °

## 2020-08-24 NOTE — ED Provider Notes (Signed)
I received the patient in signout from Dr. Billy Fischer, briefly the patient is a 75 year old male with a chief complaints of worsening shortness of breath.  Patient was just in the hospital and discharged 2 days ago.  Awaiting CT scan.  CT scan with worsening cancer versus pneumonia.  He is coughing quite a bit we will broaden his antibiotics.  I discussed the case with the hospitalist, as the patient is primarily cared for at the [facility] and there is a chance that the patient's symptoms are due to worsening cancer and inquired about transfer.  Case management consult placed.   Deno Etienne, DO 08/06/2020 1700

## 2020-08-24 NOTE — ED Provider Notes (Signed)
[facility] EMERGENCY DEPARTMENT Provider Note   CSN: [id number] Arrival date & time: 08/01/2020  1204     History Chief Complaint  Patient presents with   Shortness of Breath    Grant Taylor is a 75 y.o. male with PMH of recently diagnosed metastatic pancreatic cancer, COPD, hypertension, presents with worsening SOB. Patient was recently hospitalized at [facility] for acute hypoxemic respiratory failure due to community-acquired pneumonia and new pulmonary metastatic disease. Daughter states that this acutely worsening SOB started yesterday, and that all night he was gasping for air. He was discharged on 4L [place], but daughter says she had to increase it to 4.5L, and also tried giving him his duo nebs and albuterol which still did not help. Nauseas and 1 episode of vomit yesterday. Tolerating very little food- could keep breakfast down yesterday, dinner couldn't keep down  No change in BM, Endorses chronic cough and chest pain when coughing deep. Coughing up mucus, though daughter states he was cough[facility]. [facility]   Daughter endorses that prior to 7/1 diagnosis of pancreatic cancer patient was living independently, but now her and her husband have moved from [place] to help them    Did take morning meds. At last admission was started on duoneb , robitussin, marinol, tylenol  Stoped diclofenac, famotidine, losartan, potassium   Emergency contact: [name] (daughter) [phone]    Past Medical History:  Diagnosis Date   Cancer [facility])    Chronic GERD    Constipation    ED (erectile dysfunction)    History of BPH    Hyperlipemia    Hypertension    Hypokalemia    OAB (overactive bladder)    Obstructive sleep apnea    Positive PPD    Vitamin B 12 deficiency     Patient Active Problem List   Diagnosis Date Noted   Severe sepsis ([place]) 08/10/2020   Acute respiratory failure with hypoxia  ([place]) 08/09/2020   Essential hypertension 08/09/2020   COPD (chronic obstructive pulmonary disease) ([place]) 08/09/2020   Pancreatic mass 08/09/2020    Past Surgical History:  Procedure Laterality Date   IR IMAGING GUIDED PORT INSERTION  08/21/2020   LAMINECTOMY AND MICRODISCECTOMY LUMBAR SPINE  2007       No family history on file.  Social History   Tobacco Use   Smoking status: Former   Smokeless tobacco: Never  Scientific laboratory technician Use: Former  Substance Use Topics   Alcohol use: Not Currently   Drug use: Not Currently    Home Medications Prior to Admission medications   Medication Sig Start Date End Date Taking? Authorizing Provider  acetaminophen (TYLENOL) 325 MG tablet Take 2 tablets (650 mg total) by mouth every 6 (six) hours as needed for moderate pain. 08/22/20   Arrien, Jimmy Picket, MD  albuterol (VENTOLIN HFA) 108 (90 Base) MCG/ACT inhaler Inhale into the lungs every 6 (six) hours as needed for wheezing or shortness of breath.    [provider]  amLODipine (NORVASC) 10 MG tablet Take 10 mg by mouth daily. 10/31/19   [provider]  benzonatate (TESSALON) 100 MG capsule Take 100 mg by mouth 3 (three) times daily as needed for cough. 08/06/20   [provider]  buPROPion (WELLBUTRIN XL) 300 MG 24 hr tablet Take 300 mg by mouth daily.    [provider]  Carboxymethylcellulose Sodium 1 % GEL Place 1 drop into  both eyes 2 (two) times daily as needed (dry eyes). 06/21/19   [provider]  chlorpheniramine (CHLOR-TRIMETON) 4 MG tablet Take 4 mg by mouth daily as needed for allergies. 05/05/16   [provider]  Cholecalciferol 50 MCG (2000 UT) TABS Take 2,000 Units by mouth daily. 10/31/19   [provider]  cyclobenzaprine (FLEXERIL) 10 MG tablet Take 10 mg by mouth 3 (three) times daily as needed for muscle spasms.    [provider]  diclofenac Sodium (VOLTAREN) 1 % GEL Apply 4 g topically 4 (four)  times daily as needed (pain).    [provider]  docusate sodium (COLACE) 100 MG capsule Take 100 mg by mouth daily as needed for mild constipation.    [provider]  dronabinol (MARINOL) 2.5 MG capsule Take 1 capsule (2.5 mg total) by mouth 2 (two) times daily before lunch and supper. 08/22/20   Arrien, Jimmy Picket, MD  feeding supplement (ENSURE ENLIVE / ENSURE PLUS) LIQD Take 237 mLs by mouth daily. 05/07/20   [provider]  fluticasone (FLONASE) 50 MCG/ACT nasal spray Place 1 spray into both nostrils daily as needed for allergies. 10/31/19   [provider]  gabapentin (NEURONTIN) 300 MG capsule Take 300 mg by mouth 3 (three) times daily. 07/26/20   [provider]  guaifenesin (HUMIBID E) 400 MG TABS tablet Take 400 mg by mouth daily as needed for congestion. 05/05/16   [provider]  guaiFENesin-dextromethorphan (ROBITUSSIN DM) 100-10 MG/5ML syrup Take 5 mLs by mouth every 6 (six) hours as needed for cough (chest congestion). 08/22/20   Arrien, Jimmy Picket, MD  hydrOXYzine (ATARAX/VISTARIL) 10 MG tablet Take 10 mg by mouth daily as needed for anxiety or sleep. 03/25/20   [provider]  ipratropium-albuterol (DUONEB) 0.5-2.5 (3) MG/3ML SOLN Take 3 mLs by nebulization every 6 (six) hours as needed (shortness of breath or wheezing). 08/22/20   Arrien, Jimmy Picket, MD  loratadine (CLARITIN) 10 MG tablet Take 10 mg by mouth daily. 10/31/19   [provider]  methocarbamol (ROBAXIN) 500 MG tablet Take 1 tablet (500 mg total) by mouth every 6 (six) hours as needed for muscle spasms. 07/05/20   Kirsteins, Luanna Salk, MD  omeprazole (PRILOSEC) 40 MG capsule Take 40 mg by mouth 2 (two) times daily.    [provider]  ondansetron (ZOFRAN-ODT) 4 MG disintegrating tablet Take 4 mg by mouth 3 (three) times daily as needed for nausea/vomiting. 08/07/20   [provider]  oxyCODONE (OXY IR/ROXICODONE) 5 MG immediate  release tablet Take 5 mg by mouth every 6 (six) hours as needed for pain. 08/07/20   [provider]  polyethylene glycol (MIRALAX / GLYCOLAX) 17 g packet Take 17 g by mouth daily. 12/27/19   Khatri, Hina, PA-C  pravastatin (PRAVACHOL) 80 MG tablet Take 80 mg by mouth daily.    [provider]  sildenafil (VIAGRA) 100 MG tablet Take 100 mg by mouth daily as needed for erectile dysfunction.    [provider]  sodium chloride (BRONCHO SALINE) inhaler solution Take 1 spray by nebulization as needed (shortness of breath).    [provider]  tiZANidine (ZANAFLEX) 4 MG tablet Take 4 mg by mouth 3 (three) times daily as needed for muscle spasms. 07/26/20   [provider]    Allergies    Levofloxacin, Lisinopril, Diclofenac, Dust mite extract, Elemental sulfur, Etodolac, No known allergies, Tolterodine, Citalopram, and Other  Review of Systems   Review of  Systems  Constitutional:  Positive for appetite change.  Respiratory:  Positive for cough and shortness of breath.   Gastrointestinal:  Positive for nausea and vomiting.  Neurological:  Positive for weakness.  All other systems reviewed and are negative.  Physical Exam Updated Vital Signs BP 123/82   Pulse (!) 101   Temp 98.6 F (37 C) (Oral)   Resp 16   SpO2 93%   Physical Exam Constitutional:      Appearance: He is ill-appearing.  HENT:     Head: Normocephalic and atraumatic.  Eyes:     Extraocular Movements: Extraocular movements intact.     Pupils: Pupils are equal, round, and reactive to light.  Cardiovascular:     Rate and Rhythm: Normal rate and regular rhythm.  Pulmonary:     Comments: Diffuse crackles. Increased WOB . Satting 93% on 6L nonrebreather  Musculoskeletal:     Cervical back: Normal range of motion and neck supple.  Neurological:     Mental Status: He is alert.    ED Results / Procedures / Treatments   Labs (all labs ordered are listed, but only abnormal results  are displayed) Labs Reviewed  RESP PANEL BY RT-PCR (FLU A&B, COVID) [id number]  CULTURE, BLOOD (ROUTINE X 2)  CULTURE, BLOOD (ROUTINE X 2)  CBC WITH DIFFERENTIAL/PLATELET  LACTIC ACID, PLASMA  LACTIC ACID, PLASMA  COMPREHENSIVE METABOLIC PANEL  CBC WITH DIFFERENTIAL/PLATELET  PROTIME-INR  APTT  URINALYSIS, ROUTINE W REFLEX MICROSCOPIC  BRAIN NATRIURETIC PEPTIDE  TROPONIN I (HIGH SENSITIVITY)    EKG EKG Interpretation  Date/Time:  Saturday August 24 2020 12:14:32 EDT Ventricular Rate:  103 PR Interval:  126 QRS Duration: 78 QT Interval:  331 QTC Calculation: 434 R Axis:   53 Text Interpretation: Sinus tachycardia ST elevation, possible pericarditis or early repolarizatoin Since prior ECG, TW more peaked, slight diffuse elevation more pronounced Confirmed by Gareth Morgan [phone]) on 08/10/2020 12:40:54 PM  Radiology No results found.  Procedures Procedures   Medications Ordered in ED Medications - No data to display  ED Course  I have reviewed the triage vital signs and the nursing notes.  Pertinent labs & imaging results that were available during my care of the patient were reviewed by me and considered in my medical decision making (see chart for details).    MDM Rules/Calculators/A&P                            Patient is a 75 y.o. male with PMH of recently diagnosed metastatic pancreatic cancer, COPD, hypertension, presents with acute hypoxemic respiratory failure after leaving [facility] 2 days ago for acute hypoxemic respiratory failure due to community-acquired pneumonia and new pulmonary metastatic disease. On presentation patient with increased WOB and diffuse crackles, satting 93% on 6L. He is alert and oriented and able to provide history along with his daughter. Worsening SOB likely due to his metastatic disease and pneumonia. CXR with no acute disease and with extensive metastatic pulmonary nodules. WBC 22.4, Na 130. Collected blood culture.  Obtaining CTA  check to check for PE given his risk factors.   Patient signed out to night ED provider for remainder of work up.   Final Clinical Impression(s) / ED Diagnoses Final diagnoses:  None    Rx / DC Orders ED Discharge Orders     None        Shary Key, DO 07/28/2020 1603    Gareth Morgan, MD 08/13/2020 2340

## 2020-08-24 NOTE — H&P (Signed)
History and Physical    Fitzpatrick Alberico DSK:[id number] DOB: 09/08/1945 DOA: 08/07/2020  PCP: [name], MD  Patient coming from: Home  Chief Complaint: Cough, SOB  HPI: Grant Taylor is a 75 y.o. male with medical history significant for HTN, COPD, HLD, GERD, BPH, OSA who presents with severe cough, inability to eat, abdominal pain, previous diarrhea.  He was recently discharged with plans to follow up at the [place], however, he was not doing well at home with the severe cough.  He has not brought up anything, no hemoptysis.  He has had to increase his oxygen at home.  He has been nauseous and unable to keep food down.  He denies chest pain, except when coughing.  He notes previous constipation and now with diarrhea.  He has been taking his medications as his daughter is helping him organize them.  He denies dysuria, increased urgency, fever, chills, rash, wounds, muscle pain.   Patient discharged on 7/28 for hypoxemic respiratory failure due to PNA/sepsis in the setting of likely metastatic disease, found to have pancreatic mass with lymph node biopsied + for pancreatic cancer.  He had a port placed for anticipated treatment.  He reports having an appointment with oncology on Wednesday at the [place].   ED Course: In the ED, he was found to have a low Na at 130, Cr of 1.22 which is stable from 2 days ago.  AST and ALT mildly elevated, BNP of 59.  WBC of 22, H/H 11/34.  CT chest showed interval development of patchy opacities throughout the right lung, no embolus, mediastinal adenopathy and pulmonary metastasis.  Patient was started on Zosyn and vancomycin.  Blood cultures are pending.   Review of Systems: As per HPI otherwise all other systems reviewed and are negative.   Past Medical History:  Diagnosis Date   Cancer ([place])    Chronic GERD    Constipation    ED (erectile dysfunction)    History of BPH    Hyperlipemia    Hypertension    Hypokalemia    OAB (overactive  bladder)    Obstructive sleep apnea    Positive PPD    Vitamin B 12 deficiency     Past Surgical History:  Procedure Laterality Date   IR IMAGING GUIDED PORT INSERTION  08/21/2020   LAMINECTOMY AND MICRODISCECTOMY LUMBAR SPINE  2007    Social History  reports that he has quit smoking. He has never used smokeless tobacco. He reports previous alcohol use. He reports previous drug use.  Allergies  Allergen Reactions   Levofloxacin Rash    Other reaction(s): Angioedema   Lisinopril Cough    Other reaction(s): Angioedema   Diclofenac     Other reaction(s): Lip swelling   Dust Mite Extract Nausea And Vomiting   Elemental Sulfur    Etodolac Nausea And Vomiting    Other reaction(s): Facial swelling   No Known Allergies     Other reaction(s): ANGIOEDEMA OF LIPS, Angioedema of tongue, ANGIOEDEMA OF LIPS, Angioedema of tongue   Tolterodine    Citalopram Other (See Comments)    Other reaction(s): Drowsy   Other Rash    Family History  Problem Relation Age of Onset   Tuberculosis Mother    Pulmonary embolism Father     Prior to Admission medications   Medication Sig Start Date End Date Taking? Authorizing Provider  acetaminophen (TYLENOL) 325 MG tablet Take 2 tablets (650 mg total) by mouth every 6 (six) hours as needed for  moderate pain. 08/22/20  Yes Arrien, Jimmy Picket, MD  albuterol (VENTOLIN HFA) 108 (90 Base) MCG/ACT inhaler Inhale into the lungs every 6 (six) hours as needed for wheezing or shortness of breath.   Yes [provider]  amLODipine (NORVASC) 10 MG tablet Take 10 mg by mouth daily.   Yes [provider]  benzonatate (TESSALON) 100 MG capsule Take 100 mg by mouth 3 (three) times daily as needed for cough.   Yes [provider]  buPROPion (WELLBUTRIN XL) 300 MG 24 hr tablet Take 300 mg by mouth daily.   Yes [provider]  carboxymethylcellulose 1 % ophthalmic solution Apply 1 drop to eye 2 (two) times daily as needed (dry  eyes).   Yes [provider]  chlorpheniramine (CHLOR-TRIMETON) 4 MG tablet Take 4 mg by mouth every 6 (six) hours as needed for allergies.   Yes [provider]  Cholecalciferol (VITAMIN D) 50 MCG (2000 UT) tablet Take 2,000 Units by mouth daily.   Yes [provider]  cyclobenzaprine (FLEXERIL) 10 MG tablet Take 10 mg by mouth 3 (three) times daily as needed for muscle spasms.   Yes [provider]  diclofenac Sodium (VOLTAREN) 1 % GEL Apply 4 g topically 4 (four) times daily as needed (pain).   Yes [provider]  docusate sodium (COLACE) 100 MG capsule Take 100 mg by mouth daily as needed for mild constipation.   Yes [provider]  dronabinol (MARINOL) 2.5 MG capsule Take 1 capsule (2.5 mg total) by mouth 2 (two) times daily before lunch and supper. 08/22/20  Yes Arrien, Jimmy Picket, MD  Ensure Plus (ENSURE PLUS) LIQD Take 237 mLs by mouth 2 (two) times daily between meals.   Yes [provider]  fluticasone (FLONASE) 50 MCG/ACT nasal spray Place 1 spray into both nostrils daily as needed for allergies or rhinitis.   Yes [provider]  gabapentin (NEURONTIN) 300 MG capsule Take 300 mg by mouth 3 (three) times daily.   Yes [provider]  guaiFENesin-dextromethorphan (ROBITUSSIN DM) 100-10 MG/5ML syrup Take 5 mLs by mouth every 6 (six) hours as needed for cough (chest congestion). 08/22/20  Yes Arrien, Jimmy Picket, MD  hydrOXYzine (ATARAX/VISTARIL) 10 MG tablet Take 10 mg by mouth 3 (three) times daily as needed for anxiety (sleep).   Yes [provider]  ipratropium-albuterol (DUONEB) 0.5-2.5 (3) MG/3ML SOLN Take 3 mLs by nebulization every 6 (six) hours as needed (shortness of breath or wheezing). 08/22/20  Yes Arrien, Jimmy Picket, MD  loratadine (CLARITIN) 10 MG tablet Take 10 mg by mouth daily.   Yes [provider]  methocarbamol (ROBAXIN) 500 MG tablet Take 1 tablet (500 mg total)  by mouth every 6 (six) hours as needed for muscle spasms. 07/05/20  Yes Kirsteins, Luanna Salk, MD  omeprazole (PRILOSEC) 40 MG capsule Take 40 mg by mouth 2 (two) times daily.   Yes [provider]  ondansetron (ZOFRAN-ODT) 4 MG disintegrating tablet Take 4 mg by mouth every 8 (eight) hours as needed for nausea or vomiting.   Yes [provider]  oxyCODONE (OXY IR/ROXICODONE) 5 MG immediate release tablet Take 5 mg by mouth every 6 (six) hours as needed for severe pain or moderate pain.   Yes [provider]  polyethylene glycol (MIRALAX / GLYCOLAX) 17 g packet Take 17 g by mouth daily. 12/27/19  Yes Khatri, Hina, PA-C  pravastatin (PRAVACHOL) 80 MG tablet Take 80 mg by mouth daily.   Yes  [provider]  sildenafil (VIAGRA) 100 MG tablet Take 100 mg by mouth daily as needed for erectile dysfunction.   Yes [provider]  sodium chloride (BRONCHO SALINE) inhaler solution Take 1 spray by nebulization as needed (shortness of breath).   Yes [provider]  tiZANidine (ZANAFLEX) 4 MG tablet Take 4 mg by mouth every 8 (eight) hours as needed for muscle spasms.   Yes [provider]    Physical Exam: Vitals:   08/25/2020 2000 07/31/2020 2015 07/30/2020 2030 07/27/2020 2045  BP: 121/85 117/87 113/78 (!) 125/91  Pulse: 100 (!) 101 (!) 102 (!) 105  Resp: (!) 26 (!) 23 17 (!) 28  Temp:      TempSrc:      SpO2: 95% 95% 94% 93%  Weight:      Height:        Constitutional: Lying in bed, frequently coughing, oxygen in place Eyes: lids and conjunctivae normal ENMT: Mucous membranes are dry. Oxygen in place, no exudates in throat Neck: normal, supple.  He has a biopsy site with bandaid overlying, non tender, appears healed.   Chest: Right sided port, accessed, clean dry and non tender Respiratory: Clear on the left, decreased breath sounds on the right, no wheezing or rales Cardiovascular: Tachycardic when coughing, Regular, no pedal edema Abdomen:  + distention but soft, + ttp throughout, +BS Musculoskeletal: no clubbing / cyanosis. Normal muscle tone, decreased bulk Skin: no rashes, lesions, ulcers on exposed skin Neurologic: Grossly intact, moving easily in bed, sensation intact Psychiatric: Normal judgment and insight. Depressed mood.   Labs on Admission: I have personally reviewed following labs and imaging studies  CBC: Recent Labs  Lab 08/21/20 0723 07/30/2020 1225  WBC 15.5* 22.4*  NEUTROABS 13.1* 19.6*  HGB 12.2* 11.1*  HCT 39.5 34.1*  MCV 84.9 83.0  PLT 622* 437*    Basic Metabolic Panel: Recent Labs  Lab 08/21/20 0723 08/22/20 0309 08/11/2020 1225  NA 135 136 130*  K 4.8 4.6 4.4  CL 96* 99 95*  CO2 25 30 27   GLUCOSE 143* 164* 124*  BUN 20 23 19   CREATININE 1.20 1.21 1.22  CALCIUM 9.9 9.4 8.7*    GFR: Estimated Creatinine Clearance: 44.6 mL/min (by C-G formula based on SCr of 1.22 mg/dL).  Liver Function Tests: Recent Labs  Lab 08/21/20 0723 08/19/2020 1225  AST 60* 85*  ALT 50* 50*  ALKPHOS 83 93  BILITOT 0.3 0.6  PROT 7.1 6.3*  ALBUMIN 2.9* 2.0*    Urine analysis:    Component Value Date/Time   COLORURINE YELLOW 08/11/2020 1809   APPEARANCEUR HAZY (A) 08/20/2020 1809   LABSPEC >1.046 (H) 08/23/2020 1809   PHURINE 7.0 07/28/2020 1809   GLUCOSEU NEGATIVE 08/25/2020 1809   HGBUR NEGATIVE 07/30/2020 1809   BILIRUBINUR NEGATIVE 07/26/2020 1809   KETONESUR NEGATIVE 07/27/2020 1809   PROTEINUR 30 (A) 08/13/2020 1809   NITRITE NEGATIVE 08/22/2020 1809   LEUKOCYTESUR NEGATIVE 08/04/2020 1809    Radiological Exams on Admission: CT Angio Chest PE W and/or Wo Contrast  Result Date: 08/06/2020 CLINICAL DATA:  Patient with lung and pancreatic cancer. EXAM: CT ANGIOGRAPHY CHEST WITH CONTRAST TECHNIQUE: Multidetector CT imaging of the chest was performed using the standard protocol during bolus administration of intravenous contrast. Multiplanar CT image reconstructions and MIPs were obtained to  evaluate the vascular anatomy. CONTRAST:  57mL OMNIPAQUE IOHEXOL 350 MG/ML SOLN COMPARISON:  Chest CT August 09, 2020. FINDINGS: Cardiovascular: Right anterior chest wall Port-A-Cath is present with  tip terminating in the superior vena cava. Normal heart size. Thoracic aortic vascular calcifications. Adequate opacification of the pulmonary arteries. Narrowing of the proximal aspect of the right lower lobe pulmonary artery secondary to bulky mediastinal masses. Mediastinum/Nodes: There is a large poorly visualized left supraclavicular lymph node measuring up to 2.5 cm. No axillary adenopathy. Bulky mediastinal and bilateral hilar lymphadenopathy. Index prevascular lymph node has increased in size measuring 3.1 cm (image 41; series 7), previously 2.7 cm. Index right infrahilar lymph node measures 2.1 cm (image 62; series 7), previously 1.8 cm. Normal appearance of the esophagus. Lungs/Pleura: Central airways are patent. Interval increase in size of multiple of the extensive bilateral pulmonary metastasis. Reference subpleural left upper lobe nodule measures 2.0 cm (image 34; series 4), previously 1.5 cm. Reference left lower lobe nodule measures 2.8 cm (image 61; series 7), previously 1.9 cm. Interval development of patchy consolidative opacities throughout the right upper, right middle and right lower lobes. No pleural effusion or pneumothorax. Upper Abdomen: Incompletely visualized large pancreatic mass. Musculoskeletal: No acute osseous lesions. Review of the MIP images confirms the above findings. IMPRESSION: Interval development of patchy consolidative opacities throughout the right lung which may represent superimposed infectious process or potentially lymphangitic spread of tumor. No evidence for acute pulmonary embolus. Bulky mediastinal adenopathy narrows the right lower lobe pulmonary artery. Interval increase in size of bulky mediastinal and hilar adenopathy as well as multiple bilateral pulmonary metastasis.  Redemonstrated partially visualized large pancreatic mass. Electronically Signed   By: Lovey Newcomer M.D.   On: 07/26/2020 16:23   DG Chest Port 1 View  Result Date: 08/23/2020 CLINICAL DATA:  Shortness of breath in a patient with a history of metastatic pancreatic cancer. EXAM: PORTABLE CHEST 1 VIEW COMPARISON:  Single-view of the chest 08/18/2020. FINDINGS: Port-A-Cath is unchanged. Multiple pulmonary nodules and masses are again seen. Coarsening of the pulmonary interstitium in the mid and lower lung zones is also again seen. No new airspace disease. No pneumothorax. Very small bilateral pleural effusions. No acute bony abnormality. IMPRESSION: No acute disease in patient with extensive metastatic pulmonary nodules. Electronically Signed   By: Inge Rise M.D.   On: 08/13/2020 13:35    EKG: Independently reviewed. Diffuse mild ST elevation, PR elevation in AVR, concerning for pericarditis.   Assessment/Plan Pneumonia in the setting of lymphangitic spread of cancer (presumed) Acute respiratory failure with hypoxia (HCC) Hyponatremia - WBC is elevated, new patchy infiltrates on CT scan, elevated WBC - Will check urinary antigens - Cefepime started.  Vancomycin given in the ED, will check MRSA swab and restart if positive.  - Trend WBC - Oxygen to keep sats > 90% - Cough medication with tessalon, codeine - Trend Na, no indication for treatment now - H/O COPD, no apparent wheezing or exacerbation at this time - Albuterol, inhalers as needed    Essential hypertension - Continue amlodipine, monitor BP    Pancreatic mass, malignancy - Due for follow up at [facility] on Wednesday - Pain control with oxycodone, tizandine  Depressed mood - Continue bupropion - Hydroxyzine for anxiety  Moderate malnutrition in the setting of malignancy - continue home dronabinol - Ensure - Zofran for nausea   DVT prophylaxis: Lovenox  Code Status:   Full  Family Communication:  None at bedside   Disposition Plan:   Patient is from:  Home  Anticipated DC to:  Same  Anticipated DC date:  08/28/20  Anticipated DC barriers: Continued SOB, cough and inability to tolerate being at  home  Consults called:  None  Admission status:  Inpatient, medical telemetry   Severity of Illness: The appropriate patient status for this patient is INPATIENT. Inpatient status is judged to be reasonable and necessary in order to provide the required intensity of service to ensure the patient's safety. The patient's presenting symptoms, physical exam findings, and initial radiographic and laboratory data in the context of their chronic comorbidities is felt to place them at high risk for further clinical deterioration. Furthermore, it is not anticipated that the patient will be medically stable for discharge from the hospital within 2 midnights of admission. The following factors support the patient status of inpatient.   " The patient's presenting symptoms include severe cough, SOB, pain. " The worrisome physical exam findings include Worsening pneumonia on CT scan, pain in the abdomen. " The initial radiographic and laboratory data are worrisome because of CT scan noted above. " The chronic co-morbidities include pancreatic cancer.   * I certify that at the point of admission it is my clinical judgment that the patient will require inpatient hospital care spanning beyond 2 midnights from the point of admission due to high intensity of service, high risk for further deterioration and high frequency of surveillance required.Gilles Chiquito MD [facility]  How to contact the [facility] Attending or Consulting provider [place] or covering provider during after hours [place], for this patient?   Check the care team in [facility] and look for a) attending/consulting [facility] provider listed and b) the [facility] team listed Log into [email or web address] and use [place]'s universal password to access. If you do not have the  password, please contact the hospital operator. Locate the [facility] provider you are looking for under [facility] and page to a number that you can be directly reached. If you still have difficulty reaching the provider, please page the [facility] (Director on Call) for the Hospitalists listed on amion for assistance.  08/18/2020, 9:52 PM

## 2020-08-24 NOTE — ED Notes (Signed)
Attempted to wean off of NRB. Pt desaturated on room air and would only maintain an SpO2 of 90% with 4l [place]. Pt placed back on NRB.

## 2020-08-25 ENCOUNTER — Encounter (HOSPITAL_COMMUNITY): Payer: Self-pay | Admitting: Internal Medicine

## 2020-08-25 DIAGNOSIS — K8689 Other specified diseases of pancreas: Secondary | ICD-10-CM | POA: Diagnosis not present

## 2020-08-25 DIAGNOSIS — J189 Pneumonia, unspecified organism: Secondary | ICD-10-CM | POA: Diagnosis not present

## 2020-08-25 LAB — COMPREHENSIVE METABOLIC PANEL
ALT: 46 U/L — ABNORMAL HIGH (ref 0–44)
AST: 80 U/L — ABNORMAL HIGH (ref 15–41)
Albumin: 1.8 g/dL — ABNORMAL LOW (ref 3.5–5.0)
Alkaline Phosphatase: 89 U/L (ref 38–126)
Anion gap: 9 (ref 5–15)
BUN: 15 mg/dL (ref 8–23)
CO2: 26 mmol/L (ref 22–32)
Calcium: 8.6 mg/dL — ABNORMAL LOW (ref 8.9–10.3)
Chloride: 99 mmol/L (ref 98–111)
Creatinine, Ser: 1.2 mg/dL (ref 0.61–1.24)
GFR, Estimated: >60 mL/min (ref >60)
Glucose, Bld: 171 mg/dL — ABNORMAL HIGH (ref 70–99)
Potassium: 4.3 mmol/L (ref 3.5–5.1)
Sodium: 134 mmol/L — ABNORMAL LOW (ref 135–145)
Total Bilirubin: 0.7 mg/dL (ref 0.3–1.2)
Total Protein: 5.9 g/dL — ABNORMAL LOW (ref 6.5–8.1)

## 2020-08-25 LAB — CBC WITH DIFFERENTIAL/PLATELET
Abs Immature Granulocytes: 0.11 10*3/uL — ABNORMAL HIGH (ref 0.00–0.07)
Basophils Absolute: 0 10*3/uL (ref 0.0–0.1)
Basophils Relative: 0 %
Eosinophils Absolute: 0 10*3/uL (ref 0.0–0.5)
Eosinophils Relative: 0 %
HCT: 32.3 % — ABNORMAL LOW (ref 39.0–52.0)
Hemoglobin: 10.2 g/dL — ABNORMAL LOW (ref 13.0–17.0)
Immature Granulocytes: 1 %
Lymphocytes Relative: 4 %
Lymphs Abs: 0.8 10*3/uL (ref 0.7–4.0)
MCH: 26.6 pg (ref 26.0–34.0)
MCHC: 31.6 g/dL (ref 30.0–36.0)
MCV: 84.1 fL (ref 80.0–100.0)
Monocytes Absolute: 1.3 10*3/uL — ABNORMAL HIGH (ref 0.1–1.0)
Monocytes Relative: 7 %
Neutro Abs: 17.9 10*3/uL — ABNORMAL HIGH (ref 1.7–7.7)
Neutrophils Relative %: 88 %
Platelets: 383 10*3/uL (ref 150–400)
RBC: 3.84 MIL/uL — ABNORMAL LOW (ref 4.22–5.81)
RDW: 16.9 % — ABNORMAL HIGH (ref 11.5–15.5)
WBC: 20.1 10*3/uL — ABNORMAL HIGH (ref 4.0–10.5)
nRBC: 0 % (ref 0.0–0.2)

## 2020-08-25 LAB — PROCALCITONIN: Procalcitonin: 0.32 ng/mL

## 2020-08-25 LAB — STREP PNEUMONIAE URINARY ANTIGEN: Strep Pneumo Urinary Antigen: NEGATIVE

## 2020-08-25 LAB — MRSA NEXT GEN BY PCR, NASAL: MRSA by PCR Next Gen: NOT DETECTED

## 2020-08-25 MED ORDER — SODIUM CHLORIDE 0.9% FLUSH
3.0000 mL | Freq: Two times a day (BID) | INTRAVENOUS | Status: DC
  Administered 2020-08-25 – 2020-09-09 (×23): 3 mL via INTRAVENOUS
  Administered 2020-09-09: 10 mL via INTRAVENOUS
  Administered 2020-09-10 – 2020-09-11 (×3): 3 mL via INTRAVENOUS

## 2020-08-25 MED ORDER — DRONABINOL 2.5 MG PO CAPS
2.5000 mg | ORAL_CAPSULE | Freq: Two times a day (BID) | ORAL | Status: DC
  Administered 2020-08-25 – 2020-09-10 (×26): 2.5 mg via ORAL
  Filled 2020-08-25 (×29): qty 1

## 2020-08-25 MED ORDER — LORATADINE 10 MG PO TABS
10.0000 mg | ORAL_TABLET | Freq: Every day | ORAL | Status: DC
  Administered 2020-08-25 – 2020-09-10 (×15): 10 mg via ORAL
  Filled 2020-08-25 (×16): qty 1

## 2020-08-25 MED ORDER — MORPHINE SULFATE (PF) 2 MG/ML IV SOLN
1.0000 mg | INTRAVENOUS | Status: DC | PRN
  Administered 2020-08-26 – 2020-09-01 (×10): 2 mg via INTRAVENOUS
  Filled 2020-08-25 (×10): qty 1

## 2020-08-25 MED ORDER — BUPROPION HCL ER (XL) 300 MG PO TB24
300.0000 mg | ORAL_TABLET | Freq: Every day | ORAL | Status: DC
  Administered 2020-08-26 – 2020-09-10 (×14): 300 mg via ORAL
  Filled 2020-08-25: qty 2
  Filled 2020-08-25 (×4): qty 1
  Filled 2020-08-25: qty 2
  Filled 2020-08-25 (×10): qty 1

## 2020-08-25 MED ORDER — AMLODIPINE BESYLATE 5 MG PO TABS: 10.0000 mg | ORAL_TABLET | Freq: Every day | ORAL | Status: DC

## 2020-08-25 MED ORDER — BENZONATATE 100 MG PO CAPS
100.0000 mg | ORAL_CAPSULE | Freq: Three times a day (TID) | ORAL | Status: DC | PRN
  Administered 2020-08-25 – 2020-09-04 (×4): 100 mg via ORAL
  Filled 2020-08-25 (×5): qty 1

## 2020-08-25 MED ORDER — POLYETHYLENE GLYCOL 3350 17 G PO PACK
17.0000 g | PACK | Freq: Every day | ORAL | Status: DC
  Administered 2020-08-26 – 2020-09-09 (×10): 17 g via ORAL
  Filled 2020-08-25 (×12): qty 1

## 2020-08-25 MED ORDER — SODIUM CHLORIDE 0.9 % IN NEBU
3.0000 mL | INHALATION_SOLUTION | Freq: Three times a day (TID) | RESPIRATORY_TRACT | Status: DC | PRN
  Filled 2020-08-25: qty 3

## 2020-08-25 MED ORDER — TIZANIDINE HCL 4 MG PO TABS
4.0000 mg | ORAL_TABLET | Freq: Three times a day (TID) | ORAL | Status: DC | PRN
  Administered 2020-08-25: 4 mg via ORAL
  Filled 2020-08-25 (×2): qty 1

## 2020-08-25 MED ORDER — FLUTICASONE PROPIONATE 50 MCG/ACT NA SUSP: 1.0000 | Freq: Every day | NASAL | Status: DC | PRN

## 2020-08-25 MED ORDER — GUAIFENESIN-DM 100-10 MG/5ML PO SYRP
5.0000 mL | ORAL_SOLUTION | Freq: Four times a day (QID) | ORAL | Status: DC | PRN
  Administered 2020-08-25: 5 mL via ORAL
  Filled 2020-08-25: qty 5

## 2020-08-25 MED ORDER — VANCOMYCIN HCL IN DEXTROSE 1-5 GM/200ML-% IV SOLN
1000.0000 mg | INTRAVENOUS | Status: DC
  Administered 2020-08-25: 1000 mg via INTRAVENOUS
  Filled 2020-08-25 (×2): qty 200

## 2020-08-25 MED ORDER — IPRATROPIUM-ALBUTEROL 0.5-2.5 (3) MG/3ML IN SOLN
3.0000 mL | RESPIRATORY_TRACT | Status: DC
  Administered 2020-08-25 (×3): 3 mL via RESPIRATORY_TRACT
  Filled 2020-08-25 (×2): qty 3

## 2020-08-25 MED ORDER — ENOXAPARIN SODIUM 40 MG/0.4ML IJ SOSY
40.0000 mg | PREFILLED_SYRINGE | Freq: Every day | INTRAMUSCULAR | Status: DC
  Administered 2020-08-25 – 2020-08-29 (×5): 40 mg via SUBCUTANEOUS
  Filled 2020-08-25 (×5): qty 0.4

## 2020-08-25 MED ORDER — SODIUM CHLORIDE 0.9 % IN AERS: 1.0000 | INHALATION_SPRAY | RESPIRATORY_TRACT | Status: DC | PRN

## 2020-08-25 MED ORDER — OXYCODONE HCL 5 MG PO TABS
5.0000 mg | ORAL_TABLET | Freq: Four times a day (QID) | ORAL | Status: DC | PRN
Start: 2020-08-25 — End: 2020-09-11
  Administered 2020-08-25 – 2020-09-10 (×26): 5 mg via ORAL
  Filled 2020-08-25 (×26): qty 1

## 2020-08-25 MED ORDER — IPRATROPIUM-ALBUTEROL 0.5-2.5 (3) MG/3ML IN SOLN
3.0000 mL | Freq: Three times a day (TID) | RESPIRATORY_TRACT | Status: DC
  Administered 2020-08-25 – 2020-08-27 (×5): 3 mL via RESPIRATORY_TRACT
  Filled 2020-08-25 (×5): qty 3

## 2020-08-25 MED ORDER — HYDROXYZINE HCL 10 MG PO TABS
10.0000 mg | ORAL_TABLET | Freq: Three times a day (TID) | ORAL | Status: DC | PRN
  Administered 2020-08-25 – 2020-09-09 (×3): 10 mg via ORAL
  Filled 2020-08-25 (×5): qty 1

## 2020-08-25 MED ORDER — ALBUTEROL SULFATE HFA 108 (90 BASE) MCG/ACT IN AERS: 1.0000 | INHALATION_SPRAY | Freq: Four times a day (QID) | RESPIRATORY_TRACT | Status: DC | PRN

## 2020-08-25 MED ORDER — DOCUSATE SODIUM 100 MG PO CAPS: 100.0000 mg | ORAL_CAPSULE | Freq: Every day | ORAL | Status: DC | PRN

## 2020-08-25 MED ORDER — GUAIFENESIN ER 600 MG PO TB12
1200.0000 mg | ORAL_TABLET | Freq: Two times a day (BID) | ORAL | Status: DC
  Administered 2020-08-25 – 2020-09-10 (×28): 1200 mg via ORAL
  Filled 2020-08-25 (×29): qty 2

## 2020-08-25 MED ORDER — ALBUTEROL SULFATE (2.5 MG/3ML) 0.083% IN NEBU
2.5000 mg | INHALATION_SOLUTION | Freq: Four times a day (QID) | RESPIRATORY_TRACT | Status: DC | PRN
  Administered 2020-08-28 – 2020-09-04 (×2): 2.5 mg via RESPIRATORY_TRACT
  Filled 2020-08-25 (×2): qty 3

## 2020-08-25 MED ORDER — PANTOPRAZOLE SODIUM 40 MG PO TBEC
40.0000 mg | DELAYED_RELEASE_TABLET | Freq: Every day | ORAL | Status: DC
  Administered 2020-08-25 – 2020-09-10 (×15): 40 mg via ORAL
  Filled 2020-08-25 (×16): qty 1

## 2020-08-25 MED ORDER — POLYVINYL ALCOHOL 1.4 % OP SOLN: 1.0000 [drp] | Freq: Two times a day (BID) | OPHTHALMIC | Status: DC | PRN

## 2020-08-25 MED ORDER — ENSURE ENLIVE PO LIQD
237.0000 mL | Freq: Two times a day (BID) | ORAL | Status: DC
  Administered 2020-08-25 – 2020-08-27 (×4): 237 mL via ORAL

## 2020-08-25 MED ORDER — IPRATROPIUM-ALBUTEROL 0.5-2.5 (3) MG/3ML IN SOLN: 3.0000 mL | Freq: Four times a day (QID) | RESPIRATORY_TRACT | Status: DC | PRN

## 2020-08-25 MED ORDER — ONDANSETRON 4 MG PO TBDP
4.0000 mg | ORAL_TABLET | Freq: Three times a day (TID) | ORAL | Status: DC | PRN
  Administered 2020-08-25 – 2020-08-26 (×2): 4 mg via ORAL
  Filled 2020-08-25 (×2): qty 1

## 2020-08-25 MED ORDER — ACETAMINOPHEN 325 MG PO TABS: 650.0000 mg | ORAL_TABLET | Freq: Four times a day (QID) | ORAL | Status: DC | PRN

## 2020-08-25 MED ORDER — GUAIFENESIN-CODEINE 100-10 MG/5ML PO SOLN
5.0000 mL | ORAL | Status: DC | PRN
  Administered 2020-08-25 – 2020-08-28 (×6): 5 mL via ORAL
  Filled 2020-08-25 (×6): qty 5

## 2020-08-25 MED ORDER — SODIUM CHLORIDE 0.9 % IV SOLN: INTRAVENOUS | Status: DC

## 2020-08-25 MED ORDER — GABAPENTIN 300 MG PO CAPS
300.0000 mg | ORAL_CAPSULE | Freq: Three times a day (TID) | ORAL | Status: DC
  Administered 2020-08-25 – 2020-09-10 (×39): 300 mg via ORAL
  Filled 2020-08-25 (×42): qty 1

## 2020-08-25 NOTE — ED Notes (Signed)
Pt received lunch tray 

## 2020-08-25 NOTE — Progress Notes (Signed)
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 Marin Olp.  He presented with a large mass involving the pancreatic tail with extension in the left retroperitoneum and possible involvement of the left kidney in addition to retroperitoneal adenopathy and low-density lesion in the liver as well as innumerable masses in the lung bilaterally with bulky mediastinal and bilateral hilar adenopathy and small pleural effusion.  His biopsy from enlarged left supraclavicular lymph node was consistent with the poorly differentiated neuroendocrine carcinoma suspicious to be of pancreatic origin but pulmonary origin could not be completely excluded at this point too.  The patient is followed by the [place] system and he was considered for systemic chemotherapy locally in [place] but the [place] provider recommended for the patient to receive his treatment in [place] at a [place] facility.  He presented again with shortness of breath to the emergency department at [facility] today.  He was discharged from the hospital 2 days ago.  His daughter is at the bedside.  Objective: Vital signs in last 24 hours: Temp:  [98.2 F (36.8 C)-98.6 F (37 C)] 98.2 F (36.8 C) (07/30 2332) Pulse Rate:  [94-113] 107 (07/31 0830) Resp:  [14-28] 21 (07/31 0830) BP: (98-139)/(54-91) 112/87 (07/31 0830) SpO2:  [86 %-99 %] 99 % (07/31 0830) Weight:  [135 lb (61.2 kg)] 135 lb (61.2 kg) (07/30 1952)  Intake/Output from previous day: No intake/output data recorded. Intake/Output this shift: Total I/O In: 128 [I.V.:128] Out: 800 [Urine:800]   Lab Results:  Recent Labs    08/23/2020 1225 08/25/20 0838  WBC 22.4* 20.1*  HGB 11.1* 10.2*  HCT 34.1* 32.3*  PLT 437* 383   BMET Recent Labs    08/25/2020 1225 08/25/20 0838  NA 130* 134*  K 4.4 4.3  CL 95* 99  CO2 27 26  GLUCOSE 124* 171*  BUN 19 15   CREATININE 1.22 1.20  CALCIUM 8.7* 8.6*    Studies/Results: CT Angio Chest PE W and/or Wo Contrast  Result Date: 08/07/2020 CLINICAL DATA:  Patient with lung and pancreatic cancer. EXAM: CT ANGIOGRAPHY CHEST WITH CONTRAST TECHNIQUE: Multidetector CT imaging of the chest was performed using the standard protocol during bolus administration of intravenous contrast. Multiplanar CT image reconstructions and MIPs were obtained to evaluate the vascular anatomy. CONTRAST:  16mL OMNIPAQUE IOHEXOL 350 MG/ML SOLN COMPARISON:  Chest CT August 09, 2020. FINDINGS: Cardiovascular: Right anterior chest wall Port-A-Cath is present with tip terminating in the superior vena cava. Normal heart size. Thoracic aortic vascular calcifications. Adequate opacification of the pulmonary arteries. Narrowing of the proximal aspect of the right lower lobe pulmonary artery secondary to bulky mediastinal masses. Mediastinum/Nodes: There is a large poorly visualized left supraclavicular lymph node measuring up to 2.5 cm. No axillary adenopathy. Bulky mediastinal and bilateral hilar lymphadenopathy. Index prevascular lymph node has increased in size measuring 3.1 cm (image 41; series 7), previously 2.7 cm. Index right infrahilar lymph node measures 2.1 cm (image 62; series 7), previously 1.8 cm. Normal appearance of the esophagus. Lungs/Pleura: Central airways are patent. Interval increase in size of multiple of the extensive bilateral pulmonary metastasis. Reference subpleural left upper lobe nodule measures 2.0 cm (image 34; series 4), previously 1.5 cm. Reference left lower lobe nodule measures 2.8 cm (image 61; series 7), previously 1.9 cm. Interval development of patchy consolidative opacities throughout the right upper, right middle and right lower lobes. No pleural effusion or pneumothorax. Upper  Abdomen: Incompletely visualized large pancreatic mass. Musculoskeletal: No acute osseous lesions. Review of the MIP images confirms the  above findings. IMPRESSION: Interval development of patchy consolidative opacities throughout the right lung which may represent superimposed infectious process or potentially lymphangitic spread of tumor. No evidence for acute pulmonary embolus. Bulky mediastinal adenopathy narrows the right lower lobe pulmonary artery. Interval increase in size of bulky mediastinal and hilar adenopathy as well as multiple bilateral pulmonary metastasis. Redemonstrated partially visualized large pancreatic mass. Electronically Signed   By: Lovey Newcomer M.D.   On: 07/26/2020 16:23   DG Chest Port 1 View  Result Date: 08/01/2020 CLINICAL DATA:  Shortness of breath in a patient with a history of metastatic pancreatic cancer. EXAM: PORTABLE CHEST 1 VIEW COMPARISON:  Single-view of the chest 08/18/2020. FINDINGS: Port-A-Cath is unchanged. Multiple pulmonary nodules and masses are again seen. Coarsening of the pulmonary interstitium in the mid and lower lung zones is also again seen. No new airspace disease. No pneumothorax. Very small bilateral pleural effusions. No acute bony abnormality. IMPRESSION: No acute disease in patient with extensive metastatic pulmonary nodules. Electronically Signed   By: Inge Rise M.D.   On: 08/04/2020 13:35    Medications: I have reviewed the patient's current medications.   Assessment/Plan: This is a very pleasant 75 years old African-American male recently diagnosed with poorly differentiated neuroendocrine carcinoma likely of pancreatic origin but also pulmonary origin could not be completely excluded.  The patient presented with worsening symptoms today with enlargement of multiple bilateral pulmonary nodules in addition to bulky lymphadenopathy and suspicious lymphangitic spread of the tumor versus superimposed pneumonia. I had a discussion with the patient and his daughter about his current condition and treatment options. The patient will need to start systemic therapy as soon as  possible especially with the neuroendocrine feature of his disease.  This will be one of the situation that I would consider treatment with a regimen similar to the small cell lung cancer treatment at least at the beginning. Unfortunately the [place] system did not give authorization for the patient to receive his treatment within the [facility] health system. He has an appointment to start treatment next week at the [facility] facility in [place]. Dr. Marin Olp will see the patient tomorrow and make a final decision regarding his treatment and the post appropriate options for him. Thank you for allowing me to participate in the cae of Mr. Nobrega.  We will continue to follow-up the patient with you and assist in his management on as-needed basis.    LOS: 1 day    Eilleen Kempf 08/25/2020

## 2020-08-25 NOTE — Progress Notes (Signed)
PROGRESS NOTE    Grant Taylor  VHQ:[id number] DOB: March 18, 1945 DOA: 07/26/2020 PCP: [name], MD   Brief Narrative: 75 year old with past medical history significant for hypertension, COPD, HLD, GERD, BPH, OSA who presents with severe cough, inability to eat, abdominal pain, previous diarrhea.  He was recently discharged with plans to follow-up at the [facility] however, he was not doing well at home with the severe cough.  His oxygen requirement increased at home.  He reports nausea. 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  Patient presented with worsening hypoxic respiratory failure, shortness of breath.  CT angio showed interval development of patchy consolidative opacities throughout the right lung which may represent superimposed infectious process or potentially lymphangitic spread of tumor.  Negative for PE.  Bulky mediastinal adenopathy narrows the right lower lobe pulmonary artery.  Interval increase in size of bulky mediastinal and hilar adenopathy as well as multiple bilateral pulmonary metastases.  Redemonstrated partially visualized large pancreatic mass.   Assessment & Plan:   Principal Problem:   Pneumonia Active Problems:   Acute on chronic respiratory failure with hypoxia (HCC)   Essential hypertension   COPD (chronic obstructive pulmonary disease) (HCC)   Pancreatic mass  1-Acute hypoxic respiratory failure: Could be  secondary to pneumonia versus worsening metastatic cancer, lymphangitic spread of cancer COPD  -Currently on 5 L of oxygen -Strep antigen negative, Legionella antigen pending.  MRSA PCR negative -Continue with vancomycin and cefepime -Follow procalcitonin level. -Scheduled nebulizer every 4 hours. -Scheduled  guaifenesin. On PRN codeine.  -Oncologist, Dr. Earlie Server consulted  2-Hypertension: Hold amlodipine, systolic blood pressure soft  3-Pancreatic mass Continue with oxycodone, tizanidine  Depression: Continue with bupropion Continue with hydroxyzine for anxiety  Moderate malnutrition in the setting of malignancy: Continue with dronabinol and Ensure      Estimated body mass index is 21.14 kg/m as calculated from the following:   Height as of this encounter: 5\' 7"  (1.702 m).   Weight as of this encounter: 61.2 kg.   DVT prophylaxis: Lovenox Code Status: Full code Family Communication: Care discussed with daughter who was at bedside Disposition Plan:  Status is: Inpatient  Remains inpatient appropriate because:IV treatments appropriate due to intensity of illness or inability to take PO  Dispo: The patient is from: Home              Anticipated d/c is to: Home              Patient currently is not medically stable to d/c.   Difficult to place patient No        Consultants:  Oncology  Procedures:  None  Antimicrobials:  Vancomycin, cefepime.   Subjective: He is feeling very uncomfortable, he is complaining of shortness of breath, report cough, unable to bring up secretion.  He cough small amount of blood with sputum.  He reports abdominal pain Objective: Vitals:   08/25/20 0515 08/25/20 0715 08/25/20 0730 08/25/20 0745  BP: 120/86 134/87 134/85 (!) 128/91  Pulse: (!) 107 (!) 108 (!) 108 (!) 108  Resp: 17 (!) 21 20 (!) 21  Temp:      TempSrc:      SpO2: 94% 93% 90% 90%  Weight:      Height:        Intake/Output Summary (Last 24 hours) at 08/25/2020 0820 Last  data filed at 08/25/2020 0757 Gross per 24 hour  Intake --  Output 550 ml  Net -550 ml   Filed Weights   08/23/2020 1952  Weight: 61.2 kg    Examination:  General exam: thin appearing Respiratory system: BL ronchus.  Cardiovascular system: S1 & S2 heard, RRR.  Gastrointestinal system:  Abdomen is nondistended, soft and Mild tender. No organomegaly or masses felt. Normal bowel sounds heard. Central nervous system: Alert and oriented. Extremities: Symmetric 5 x 5 power.   Data Reviewed: I have personally reviewed following labs and imaging studies  CBC: Recent Labs  Lab 08/21/20 0723 08/23/2020 1225  WBC 15.5* 22.4*  NEUTROABS 13.1* 19.6*  HGB 12.2* 11.1*  HCT 39.5 34.1*  MCV 84.9 83.0  PLT 622* 742*   Basic Metabolic Panel: Recent Labs  Lab 08/21/20 0723 08/22/20 0309 08/08/2020 1225  NA 135 136 130*  K 4.8 4.6 4.4  CL 96* 99 95*  CO2 25 30 27   GLUCOSE 143* 164* 124*  BUN 20 23 19   CREATININE 1.20 1.21 1.22  CALCIUM 9.9 9.4 8.7*   GFR: Estimated Creatinine Clearance: 44.6 mL/min (by C-G formula based on SCr of 1.22 mg/dL). Liver Function Tests: Recent Labs  Lab 08/21/20 0723 08/23/2020 1225  AST 60* 85*  ALT 50* 50*  ALKPHOS 83 93  BILITOT 0.3 0.6  PROT 7.1 6.3*  ALBUMIN 2.9* 2.0*   No results for input(s): LIPASE, AMYLASE in the last 168 hours. No results for input(s): AMMONIA in the last 168 hours. Coagulation Profile: Recent Labs  Lab 08/21/20 0928  INR 1.1   Cardiac Enzymes: No results for input(s): CKTOTAL, CKMB, CKMBINDEX, TROPONINI in the last 168 hours. BNP (last 3 results) No results for input(s): PROBNP in the last 8760 hours. HbA1C: No results for input(s): HGBA1C in the last 72 hours. CBG: No results for input(s): GLUCAP in the last 168 hours. Lipid Profile: No results for input(s): CHOL, HDL, LDLCALC, TRIG, CHOLHDL, LDLDIRECT in the last 72 hours. Thyroid Function Tests: No results for input(s): TSH, T4TOTAL, FREET4, T3FREE, THYROIDAB in the last 72 hours. Anemia Panel: No results for input(s): VITAMINB12, FOLATE, FERRITIN, TIBC, IRON, RETICCTPCT in the last 72 hours. Sepsis Labs: Recent Labs  Lab 08/18/20 0829 07/30/2020 1334 08/04/2020 1545  PROCALCITON <0.10  --   --   LATICACIDVEN  --  1.1 1.4    Recent Results  (from the past 240 hour(s))  Resp Panel by RT-PCR (Flu A&B, Covid) Nasopharyngeal Swab     Status: None   Collection Time: 07/28/2020  1:34 PM   Specimen: Nasopharyngeal Swab; Nasopharyngeal(NP) swabs in vial transport medium  Result Value Ref Range Status   SARS Coronavirus 2 by RT PCR NEGATIVE NEGATIVE Final    Comment: (NOTE) SARS-CoV-2 target nucleic acids are NOT DETECTED.  The SARS-CoV-2 RNA is generally detectable in upper respiratory specimens during the acute phase of infection. The lowest concentration of SARS-CoV-2 viral copies this assay can detect is 138 copies/mL. A negative result does not preclude SARS-Cov-2 infection and should not be used as the sole basis for treatment or other patient management decisions. A negative result may occur with  improper specimen collection/handling, submission of specimen other than nasopharyngeal swab, presence of viral mutation(s) within the areas targeted by this assay, and inadequate number of viral copies(<138 copies/mL). A negative result must be combined with clinical observations, patient history, and epidemiological information. The expected result is Negative.  Fact Sheet for Patients:  EntrepreneurPulse.com.au  Fact Sheet for  Healthcare Providers:  IncredibleEmployment.be  This test is no t yet approved or cleared by the [place] and  has been authorized for detection and/or diagnosis of SARS-CoV-2 by FDA under an Emergency Use Authorization (EUA). This EUA will remain  in effect (meaning this test can be used) for the duration of the COVID-19 declaration under Section 564(b)(1) of the Act, 21 U.S.C.section 360bbb-3(b)(1), unless the authorization is terminated  or revoked sooner.       Influenza A by PCR NEGATIVE NEGATIVE Final   Influenza B by PCR NEGATIVE NEGATIVE Final    Comment: (NOTE) The Xpert Xpress SARS-CoV-2/FLU/RSV plus assay is intended as an aid in the  diagnosis of influenza from Nasopharyngeal swab specimens and should not be used as a sole basis for treatment. Nasal washings and aspirates are unacceptable for Xpert Xpress SARS-CoV-2/FLU/RSV testing.  Fact Sheet for Patients: EntrepreneurPulse.com.au  Fact Sheet for Healthcare Providers: IncredibleEmployment.be  This test is not yet approved or cleared by the [place] FDA and has been authorized for detection and/or diagnosis of SARS-CoV-2 by FDA under an Emergency Use Authorization (EUA). This EUA will remain in effect (meaning this test can be used) for the duration of the COVID-19 declaration under Section 564(b)(1) of the Act, 21 U.S.C. section 360bbb-3(b)(1), unless the authorization is terminated or revoked.  Performed at [facility], [place] [address], [place], [place] [address]          Radiology Studies: CT Angio Chest PE W and/or Wo Contrast  Result Date: 08/01/2020 CLINICAL DATA:  Patient with lung and pancreatic cancer. EXAM: CT ANGIOGRAPHY CHEST WITH CONTRAST TECHNIQUE: Multidetector CT imaging of the chest was performed using the standard protocol during bolus administration of intravenous contrast. Multiplanar CT image reconstructions and MIPs were obtained to evaluate the vascular anatomy. CONTRAST:  58mL OMNIPAQUE IOHEXOL 350 MG/ML SOLN COMPARISON:  Chest CT August 09, 2020. FINDINGS: Cardiovascular: Right anterior chest wall Port-A-Cath is present with tip terminating in the superior vena cava. Normal heart size. Thoracic aortic vascular calcifications. Adequate opacification of the pulmonary arteries. Narrowing of the proximal aspect of the right lower lobe pulmonary artery secondary to bulky mediastinal masses. Mediastinum/Nodes: There is a large poorly visualized left supraclavicular lymph node measuring up to 2.5 cm. No axillary adenopathy. Bulky mediastinal and bilateral hilar lymphadenopathy. Index prevascular lymph  node has increased in size measuring 3.1 cm (image 41; series 7), previously 2.7 cm. Index right infrahilar lymph node measures 2.1 cm (image 62; series 7), previously 1.8 cm. Normal appearance of the esophagus. Lungs/Pleura: Central airways are patent. Interval increase in size of multiple of the extensive bilateral pulmonary metastasis. Reference subpleural left upper lobe nodule measures 2.0 cm (image 34; series 4), previously 1.5 cm. Reference left lower lobe nodule measures 2.8 cm (image 61; series 7), previously 1.9 cm. Interval development of patchy consolidative opacities throughout the right upper, right middle and right lower lobes. No pleural effusion or pneumothorax. Upper Abdomen: Incompletely visualized large pancreatic mass. Musculoskeletal: No acute osseous lesions. Review of the MIP images confirms the above findings. IMPRESSION: Interval development of patchy consolidative opacities throughout the right lung which may represent superimposed infectious process or potentially lymphangitic spread of tumor. No evidence for acute pulmonary embolus. Bulky mediastinal adenopathy narrows the right lower lobe pulmonary artery. Interval increase in size of bulky mediastinal and hilar adenopathy as well as multiple bilateral pulmonary metastasis. Redemonstrated partially visualized large pancreatic mass. Electronically Signed   By: Polly Cobia.D.  On: 07/29/2020 16:23   DG Chest Port 1 View  Result Date: 08/21/2020 CLINICAL DATA:  Shortness of breath in a patient with a history of metastatic pancreatic cancer. EXAM: PORTABLE CHEST 1 VIEW COMPARISON:  Single-view of the chest 08/18/2020. FINDINGS: Port-A-Cath is unchanged. Multiple pulmonary nodules and masses are again seen. Coarsening of the pulmonary interstitium in the mid and lower lung zones is also again seen. No new airspace disease. No pneumothorax. Very small bilateral pleural effusions. No acute bony abnormality. IMPRESSION: No acute disease  in patient with extensive metastatic pulmonary nodules. Electronically Signed   By: Inge Rise M.D.   On: 08/20/2020 13:35        Scheduled Meds:  amLODipine  10 mg Oral Daily   buPROPion  300 mg Oral Daily   Chlorhexidine Gluconate Cloth  6 each Topical Daily   dronabinol  2.5 mg Oral BID AC   enoxaparin (LOVENOX) injection  40 mg Subcutaneous Daily   feeding supplement  237 mL Oral BID BM   gabapentin  300 mg Oral TID   loratadine  10 mg Oral Daily   pantoprazole  40 mg Oral Daily   polyethylene glycol  17 g Oral Daily   sodium chloride flush  10-40 mL Intracatheter Q12H   sodium chloride flush  3 mL Intravenous Q12H   Continuous Infusions:  sodium chloride     ceFEPime (MAXIPIME) IV Stopped (08/25/20 0006)     LOS: 1 day    Time spent: 35 minutes    Iker Nuttall A Dell Briner, MD [facility]   If 7PM-7AM, please contact night-coverage [email or web address]  08/25/2020, 8:20 AM

## 2020-08-25 NOTE — Progress Notes (Signed)
Pharmacy Antibiotic Note  Grant Bojarski is a 75 y.o. male admitted on 07/31/2020 with pneumonia.  Pharmacy has been consulted for Vancomycin dosing.  Vancomycin 1000 mg IV Q 24 hrs. Goal AUC 400-550. Expected AUC: 511 SCr used: 1.22   Plan: Patient got Vanc 1250 mg IV x 1 on 7/30 at 1645 Continue Vanc 1000 mg IV q24hr starting at 1600 today Monitor renal function, clinical status, C&S and vanc levels as needed  Height: 5\' 7"  (170.2 cm) Weight: 61.2 kg (135 lb) IBW/kg (Calculated) : 66.1  Temp (24hrs), Avg:98.4 F (36.9 C), Min:98.2 F (36.8 C), Max:98.6 F (37 C)  Recent Labs  Lab 08/21/20 0723 08/22/20 0309 08/02/2020 1225 08/21/2020 1334 08/05/2020 1545 08/25/20 0838  WBC 15.5*  --  22.4*  --   --  20.1*  CREATININE 1.20 1.21 1.22  --   --  1.20  LATICACIDVEN  --   --   --  1.1 1.4  --     Estimated Creatinine Clearance: 45.3 mL/min (by C-G formula based on SCr of 1.2 mg/dL).    Allergies  Allergen Reactions   Levofloxacin Rash    Other reaction(s): Angioedema   Lisinopril Cough    Other reaction(s): Angioedema   Diclofenac     Other reaction(s): Lip swelling   Dust Mite Extract Nausea And Vomiting   Elemental Sulfur    Etodolac Nausea And Vomiting    Other reaction(s): Facial swelling   No Known Allergies     Other reaction(s): ANGIOEDEMA OF LIPS, Angioedema of tongue, ANGIOEDEMA OF LIPS, Angioedema of tongue   Tolterodine    Citalopram Other (See Comments)    Other reaction(s): Drowsy   Other Rash    Antimicrobials this admission: Zosyn 7/30 x 1 Cefepime  7/31 >>  Vanc 7/30 >>   Thank you for allowing pharmacy to be a part of this patient's care.  Alanda Slim, PharmD, [facility] Clinical Pharmacist Please see AMION for all Pharmacists' Contact Phone Numbers 08/25/2020, 9:24 AM

## 2020-08-25 NOTE — TOC Initial Note (Signed)
Transition of Care [facility]) - Initial/Assessment Note    Patient Details  Name: Grant Taylor MRN: [id number] Date of Birth: 1945/08/03  Transition of Care [facility]) CM/SW Contact:    Verdell Carmine, RN Phone Number: 08/25/2020, 12:28 PM  Clinical Narrative:                  Patient readmitted after 2 days with [facility] new onset ,pneumonia, vs malignancy  Is supposed to be receiving oncology treatments in [place] next week. Notification to [place] done , there had been a previous call in for the [place].Patient lives with adult children who can transport him to needed appointments.  [place] notification # [phone]  [place] referral  number  [id number]  Expected Discharge Plan: Home/Self Care Barriers to Discharge: Continued Medical Work up   Patient Goals and CMS Choice        Expected Discharge Plan and Services Expected Discharge Plan: Home/Self Care       Living arrangements for the past 2 months: Single Family Home                                      Prior Living Arrangements/Services Living arrangements for the past 2 months: Single Family Home Lives with:: Adult Children Patient language and need for interpreter reviewed:: Yes        Need for Family Participation in Patient Care: Yes (Comment) Care giver support system in place?: Yes (comment)   Criminal Activity/Legal Involvement Pertinent to Current Situation/Hospitalization: No - Comment as needed  Activities of Daily Living      Permission Sought/Granted                  Emotional Assessment   Attitude/Demeanor/Rapport: Gracious   Orientation: : Oriented to Self, Oriented to Place, Oriented to  Time Alcohol / Substance Use: Not Applicable Psych Involvement: No (comment)  Admission diagnosis:  Pneumonia [J18.9] Patient Active Problem List   Diagnosis Date Noted   Pneumonia 08/01/2020   Severe sepsis ([place]) 08/10/2020   Acute on chronic respiratory failure with hypoxia ([place])  08/09/2020   Essential hypertension 08/09/2020   COPD (chronic obstructive pulmonary disease) ([place]) 08/09/2020   Pancreatic mass 08/09/2020   PCP:  [name], MD Pharmacy:   [facility] [place], [place] AT [address]  [id number] Phone: [phone] Fax: [phone]     Social Determinants of Health (SDOH) Interventions    Readmission Risk Interventions No flowsheet data found.

## 2020-08-26 DIAGNOSIS — J9621 Acute and chronic respiratory failure with hypoxia: Secondary | ICD-10-CM

## 2020-08-26 DIAGNOSIS — R0603 Acute respiratory distress: Secondary | ICD-10-CM | POA: Diagnosis not present

## 2020-08-26 DIAGNOSIS — C801 Malignant (primary) neoplasm, unspecified: Secondary | ICD-10-CM

## 2020-08-26 DIAGNOSIS — R0902 Hypoxemia: Secondary | ICD-10-CM

## 2020-08-26 DIAGNOSIS — J189 Pneumonia, unspecified organism: Secondary | ICD-10-CM

## 2020-08-26 LAB — GLUCOSE, CAPILLARY: Glucose-Capillary: 114 mg/dL — ABNORMAL HIGH (ref 70–99)

## 2020-08-26 LAB — PROCALCITONIN: Procalcitonin: 0.43 ng/mL

## 2020-08-26 LAB — EXPECTORATED SPUTUM ASSESSMENT W GRAM STAIN, RFLX TO RESP C

## 2020-08-26 MED ORDER — METRONIDAZOLE 500 MG/100ML IV SOLN
500.0000 mg | Freq: Two times a day (BID) | INTRAVENOUS | Status: DC
  Administered 2020-08-26 – 2020-08-27 (×4): 500 mg via INTRAVENOUS
  Filled 2020-08-26 (×4): qty 100

## 2020-08-26 MED ORDER — METHYLPREDNISOLONE SODIUM SUCC 125 MG IJ SOLR
80.0000 mg | Freq: Four times a day (QID) | INTRAMUSCULAR | Status: DC
  Administered 2020-08-26 – 2020-09-01 (×23): 80 mg via INTRAVENOUS
  Filled 2020-08-26 (×23): qty 2

## 2020-08-26 MED ORDER — SODIUM CHLORIDE 0.9 % IV SOLN
500.0000 mg | Freq: Every day | INTRAVENOUS | Status: DC
  Administered 2020-08-26 – 2020-08-27 (×2): 500 mg via INTRAVENOUS
  Filled 2020-08-26 (×3): qty 500

## 2020-08-26 NOTE — Progress Notes (Signed)
PROGRESS NOTE    Grant Taylor  HTD:[id number] DOB: January 06, 1946 DOA: 08/01/2020 PCP: [name], MD   Brief Narrative: 75 year old with past medical history significant for hypertension, COPD, HLD, GERD, BPH, OSA who presents with severe cough, inability to eat, abdominal pain, previous diarrhea.  He was recently discharged with plans to follow-up at the [facility] however, he was not doing well at home with the severe cough.  His oxygen requirement increased at home.  He reports nausea. 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  Patient presented with worsening hypoxic respiratory failure, shortness of breath.  CT angio showed interval development of patchy consolidative opacities throughout the right lung which may represent superimposed infectious process or potentially lymphangitic spread of tumor.  Negative for PE.  Bulky mediastinal adenopathy narrows the right lower lobe pulmonary artery.  Interval increase in size of bulky mediastinal and hilar adenopathy as well as multiple bilateral pulmonary metastases.  Redemonstrated partially visualized large pancreatic mass.   Assessment & Plan:   Principal Problem:   Pneumonia Active Problems:   Acute on chronic respiratory failure with hypoxia (HCC)   Essential hypertension   COPD (chronic obstructive pulmonary disease) (HCC)   Pancreatic mass  1-Acute hypoxic respiratory failure: Could be  secondary to pneumonia versus worsening metastatic cancer, lymphangitic spread of cancer. COPD  -oxygen requirement increase to 8 L last night. He feels better this morning.  -Strep antigen negative, Legionella antigen pending.  MRSA PCR negative -Continue with Cefepime, discontinue Vancomycin MRSA PCR negative. Will add  azithromycin and flagyl.  -Pro-calcitonin level. Borderline 0.32--0.43. -Continue with Scheduled nebulizer every 4 hours. -Scheduled guaifenesin. On PRN codeine.  -Appreciate Dr. Marin Olp follow up. Started on IV steroids.  -WBC down to 20.  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 Pancreatic mass Continue with oxycodone, tizanidine  Depression: Continue with bupropion Continue with hydroxyzine for anxiety  Moderate malnutrition in the setting of malignancy: Continue with dronabinol and Ensure      Estimated body mass index is 21.14 kg/m as calculated from the following:   Height as of this encounter: 5\' 7"  (1.702 m).   Weight as of this encounter: 61.2 kg.   DVT prophylaxis: Lovenox Code Status: Full code Family Communication: Care discussed with daughter who was at bedside 7/31 Disposition Plan:  Status is: Inpatient  Remains inpatient appropriate because:IV treatments appropriate due to intensity of illness or inability to take PO  Dispo: The patient is from: Home              Anticipated d/c is to: Home              Patient currently is not medically stable to d/c.   Difficult to place patient No        Consultants:  Oncology  Procedures:  None  Antimicrobials:  Vancomycin, cefepime.   Subjective: He is breathing better today. He feels better. Report productive cough. Report abdominal pain, but has been tolerating diet. He prefer current regular diet.    Objective: Vitals:   08/26/20 0404 08/26/20 0729 08/26/20 0800 08/26/20 1141  BP: 140/90   135/89  Pulse: (!) 101 (!) 105  (!) 103  Resp: Marland Kitchen)  21 20 (!) 26 20  Temp: 99.9 F (37.7 C)   98.6 F (37 C)  TempSrc: Oral     SpO2: 95% 93%  93%  Weight:      Height:        Intake/Output Summary  (Last 24 hours) at 08/26/2020 1339 Last data filed at 08/26/2020 1100 Gross per 24 hour  Intake 740 ml  Output 425 ml  Net 315 ml    Filed Weights   08/17/2020 1952  Weight: 61.2 kg    Examination:  General exam: Thin, In no acute distress.  Respiratory system: BL ronchus.  Cardiovascular system: S 1, S 2 RRR Gastrointestinal system: BS present, soft, nt Central nervous system: Alert, oriented  Extremities: Symmetric power.    Data Reviewed: I have personally reviewed following labs and imaging studies  CBC: Recent Labs  Lab 08/21/20 0723 08/03/2020 1225 08/25/20 0838  WBC 15.5* 22.4* 20.1*  NEUTROABS 13.1* 19.6* 17.9*  HGB 12.2* 11.1* 10.2*  HCT 39.5 34.1* 32.3*  MCV 84.9 83.0 84.1  PLT 622* 437* 465    Basic Metabolic Panel: Recent Labs  Lab 08/21/20 0723 08/22/20 0309 08/19/2020 1225 08/25/20 0838  NA 135 136 130* 134*  K 4.8 4.6 4.4 4.3  CL 96* 99 95* 99  CO2 25 30 27 26   GLUCOSE 143* 164* 124* 171*  BUN 20 23 19 15   CREATININE 1.20 1.21 1.22 1.20  CALCIUM 9.9 9.4 8.7* 8.6*    GFR: Estimated Creatinine Clearance: 45.3 mL/min (by C-G formula based on SCr of 1.2 mg/dL). Liver Function Tests: Recent Labs  Lab 08/21/20 0723 08/22/2020 1225 08/25/20 0838  AST 60* 85* 80*  ALT 50* 50* 46*  ALKPHOS 83 93 89  BILITOT 0.3 0.6 0.7  PROT 7.1 6.3* 5.9*  ALBUMIN 2.9* 2.0* 1.8*    No results for input(s): LIPASE, AMYLASE in the last 168 hours. No results for input(s): AMMONIA in the last 168 hours. Coagulation Profile: Recent Labs  Lab 08/21/20 0928  INR 1.1    Cardiac Enzymes: No results for input(s): CKTOTAL, CKMB, CKMBINDEX, TROPONINI in the last 168 hours. BNP (last 3 results) No results for input(s): PROBNP in the last 8760 hours. HbA1C: No results for input(s): HGBA1C in the last 72 hours. CBG: Recent Labs  Lab 08/26/20 0733  GLUCAP 114*   Lipid Profile: No results for input(s): CHOL, HDL, LDLCALC, TRIG, CHOLHDL, LDLDIRECT in the last 72  hours. Thyroid Function Tests: No results for input(s): TSH, T4TOTAL, FREET4, T3FREE, THYROIDAB in the last 72 hours. Anemia Panel: No results for input(s): VITAMINB12, FOLATE, FERRITIN, TIBC, IRON, RETICCTPCT in the last 72 hours. Sepsis Labs: Recent Labs  Lab 08/09/2020 1334 08/06/2020 1545 08/25/20 0838 08/26/20 0142  PROCALCITON  --   --  0.32 0.43  LATICACIDVEN 1.1 1.4  --   --      Recent Results (from the past 240 hour(s))  Resp Panel by RT-PCR (Flu A&B, Covid) Nasopharyngeal Swab     Status: None   Collection Time: 07/26/2020  1:34 PM   Specimen: Nasopharyngeal Swab; Nasopharyngeal(NP) swabs in vial transport medium  Result Value Ref Range Status   SARS Coronavirus 2 by RT PCR NEGATIVE NEGATIVE Final    Comment: (NOTE) SARS-CoV-2 target nucleic acids are NOT DETECTED.  The SARS-CoV-2 RNA is generally detectable in upper respiratory specimens during the acute phase of infection. The lowest concentration of SARS-CoV-2 viral copies this assay can detect is 138 copies/mL. A negative result does not preclude SARS-Cov-2  infection and should not be used as the sole basis for treatment or other patient management decisions. A negative result may occur with  improper specimen collection/handling, submission of specimen other than nasopharyngeal swab, presence of viral mutation(s) within the areas targeted by this assay, and inadequate number of viral copies(<138 copies/mL). A negative result must be combined with clinical observations, patient history, and epidemiological information. The expected result is Negative.  Fact Sheet for Patients:  EntrepreneurPulse.com.au  Fact Sheet for Healthcare Providers:  IncredibleEmployment.be  This test is no t yet approved or cleared by the [place] FDA and  has been authorized for detection and/or diagnosis of SARS-CoV-2 by FDA under an Emergency Use Authorization (EUA). This EUA will remain  in  effect (meaning this test can be used) for the duration of the COVID-19 declaration under Section 564(b)(1) of the Act, 21 U.S.C.section 360bbb-3(b)(1), unless the authorization is terminated  or revoked sooner.       Influenza A by PCR NEGATIVE NEGATIVE Final   Influenza B by PCR NEGATIVE NEGATIVE Final    Comment: (NOTE) The Xpert Xpress SARS-CoV-2/FLU/RSV plus assay is intended as an aid in the diagnosis of influenza from Nasopharyngeal swab specimens and should not be used as a sole basis for treatment. Nasal washings and aspirates are unacceptable for Xpert Xpress SARS-CoV-2/FLU/RSV testing.  Fact Sheet for Patients: EntrepreneurPulse.com.au  Fact Sheet for Healthcare Providers: IncredibleEmployment.be  This test is not yet approved or cleared by the [place] FDA and has been authorized for detection and/or diagnosis of SARS-CoV-2 by FDA under an Emergency Use Authorization (EUA). This EUA will remain in effect (meaning this test can be used) for the duration of the COVID-19 declaration under Section 564(b)(1) of the Act, 21 U.S.C. section 360bbb-3(b)(1), unless the authorization is terminated or revoked.  Performed at [facility], [place] [address] [place], [place] [address]   Blood Culture (routine x 2)     Status: None (Preliminary result)   Collection Time: 08/10/2020  1:34 PM   Specimen: BLOOD RIGHT FOREARM  Result Value Ref Range Status   Specimen Description BLOOD RIGHT FOREARM  Final   Special Requests   Final    BOTTLES DRAWN AEROBIC AND ANAEROBIC Blood Culture adequate volume   Culture   Final    NO GROWTH 2 DAYS Performed at [facility], [place] [address] [place], [place] [address]    Report Status PENDING  Incomplete  Blood Culture (routine x 2)     Status: None (Preliminary result)   Collection Time: 08/23/2020  6:10 PM   Specimen: BLOOD  Result Value Ref Range Status   Specimen Description BLOOD PORTA  CATH  Final   Special Requests   Final    BOTTLES DRAWN AEROBIC AND ANAEROBIC Blood Culture adequate volume   Culture   Final    NO GROWTH 2 DAYS Performed at [facility], [place] [address], [place], [place] [address]    Report Status PENDING  Incomplete  MRSA Next Gen by PCR, Nasal     Status: None   Collection Time: 08/25/20  8:38 AM   Specimen: Nasal Mucosa; Nasal Swab  Result Value Ref Range Status   MRSA by PCR Next Gen NOT DETECTED NOT DETECTED Final    Comment: (NOTE) The GeneXpert MRSA Assay (FDA approved for NASAL specimens only), is one component of a comprehensive MRSA colonization surveillance program. It is not intended to diagnose MRSA infection nor to guide or monitor treatment  for MRSA infections. Test performance is not FDA approved in patients less than 42 years old. Performed at [facility], [place] [address], [place], [place] [address]           Radiology Studies: CT Angio Chest PE W and/or Wo Contrast  Result Date: 08/02/2020 CLINICAL DATA:  Patient with lung and pancreatic cancer. EXAM: CT ANGIOGRAPHY CHEST WITH CONTRAST TECHNIQUE: Multidetector CT imaging of the chest was performed using the standard protocol during bolus administration of intravenous contrast. Multiplanar CT image reconstructions and MIPs were obtained to evaluate the vascular anatomy. CONTRAST:  6mL OMNIPAQUE IOHEXOL 350 MG/ML SOLN COMPARISON:  Chest CT August 09, 2020. FINDINGS: Cardiovascular: Right anterior chest wall Port-A-Cath is present with tip terminating in the superior vena cava. Normal heart size. Thoracic aortic vascular calcifications. Adequate opacification of the pulmonary arteries. Narrowing of the proximal aspect of the right lower lobe pulmonary artery secondary to bulky mediastinal masses. Mediastinum/Nodes: There is a large poorly visualized left supraclavicular lymph node measuring up to 2.5 cm. No axillary adenopathy. Bulky mediastinal and bilateral hilar  lymphadenopathy. Index prevascular lymph node has increased in size measuring 3.1 cm (image 41; series 7), previously 2.7 cm. Index right infrahilar lymph node measures 2.1 cm (image 62; series 7), previously 1.8 cm. Normal appearance of the esophagus. Lungs/Pleura: Central airways are patent. Interval increase in size of multiple of the extensive bilateral pulmonary metastasis. Reference subpleural left upper lobe nodule measures 2.0 cm (image 34; series 4), previously 1.5 cm. Reference left lower lobe nodule measures 2.8 cm (image 61; series 7), previously 1.9 cm. Interval development of patchy consolidative opacities throughout the right upper, right middle and right lower lobes. No pleural effusion or pneumothorax. Upper Abdomen: Incompletely visualized large pancreatic mass. Musculoskeletal: No acute osseous lesions. Review of the MIP images confirms the above findings. IMPRESSION: Interval development of patchy consolidative opacities throughout the right lung which may represent superimposed infectious process or potentially lymphangitic spread of tumor. No evidence for acute pulmonary embolus. Bulky mediastinal adenopathy narrows the right lower lobe pulmonary artery. Interval increase in size of bulky mediastinal and hilar adenopathy as well as multiple bilateral pulmonary metastasis. Redemonstrated partially visualized large pancreatic mass. Electronically Signed   By: Lovey Newcomer M.D.   On: 07/27/2020 16:23        Scheduled Meds:  buPROPion  300 mg Oral Daily   Chlorhexidine Gluconate Cloth  6 each Topical Daily   dronabinol  2.5 mg Oral BID AC   enoxaparin (LOVENOX) injection  40 mg Subcutaneous Daily   feeding supplement  237 mL Oral BID BM   gabapentin  300 mg Oral TID   guaiFENesin  1,200 mg Oral BID   ipratropium-albuterol  3 mL Nebulization TID   loratadine  10 mg Oral Daily   methylPREDNISolone (SOLU-MEDROL) injection  80 mg Intravenous Q6H   pantoprazole  40 mg Oral Daily    polyethylene glycol  17 g Oral Daily   sodium chloride flush  10-40 mL Intracatheter Q12H   sodium chloride flush  3 mL Intravenous Q12H   Continuous Infusions:  azithromycin     ceFEPime (MAXIPIME) IV Stopped (08/26/20 1039)   metronidazole Stopped (08/26/20 1002)     LOS: 2 days    Time spent: 35 minutes    Marlyn Rabine A Dmarius Reeder, MD [facility]   If 7PM-7AM, please contact night-coverage [email or web address]  08/26/2020, 1:39 PM

## 2020-08-26 NOTE — Progress Notes (Signed)
I am absolutely surprised that Mr. Grant Taylor is back in the hospital.  He was only discharged last week.  He now comes in with pneumonia.  He has the adenopathy.  Again, I have to suspect that this is a very aggressive malignancy.  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  Whether this is a small cell lung cancer or small cell neuroendocrine type of cancer, I think it is not all that important.  I think that he is probably going need some kind of treatment pretty quickly.  He is supposed to go and meet with the oncologist at the [facility] on Wednesday.  I will have to try to give him a call and let him know what is going on.  I think we might have to think about treating him in the hospital.  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  It could be a small cell lung cancer.  It certainly is behaving like a small cell lung cancer.  I would probably treat him with carboplatinum/VP-16/immunotherapy.  His lab yesterday showed [place] 20.  Hemoglobin 10.2.  Platelet count 383,000.  His sodium is 134.  Potassium 4.3.  BUN 15 creatinine 1.2.  Calcium 8.6.  Albumin is only 1.8.  This might be an issue.  Again, he really needs to get this pneumonia a little better under control before he does any treatment.  I know that the [place] has not allowed [place] to treat him.Again, I will have to talk to his oncologist.  I know that he will get outstanding care from all staff on 2 W.  Lattie Haw, MD  [name] 11:24

## 2020-08-26 DEATH — deceased

## 2020-08-27 DIAGNOSIS — E43 Unspecified severe protein-calorie malnutrition: Secondary | ICD-10-CM | POA: Insufficient documentation

## 2020-08-27 DIAGNOSIS — J9621 Acute and chronic respiratory failure with hypoxia: Secondary | ICD-10-CM | POA: Diagnosis not present

## 2020-08-27 DIAGNOSIS — J189 Pneumonia, unspecified organism: Secondary | ICD-10-CM | POA: Diagnosis not present

## 2020-08-27 DIAGNOSIS — R0902 Hypoxemia: Secondary | ICD-10-CM | POA: Diagnosis not present

## 2020-08-27 DIAGNOSIS — R0603 Acute respiratory distress: Secondary | ICD-10-CM | POA: Diagnosis not present

## 2020-08-27 LAB — CBC WITH DIFFERENTIAL/PLATELET
Abs Immature Granulocytes: 0.07 10*3/uL (ref 0.00–0.07)
Basophils Absolute: 0 10*3/uL (ref 0.0–0.1)
Basophils Relative: 0 %
Eosinophils Absolute: 0 10*3/uL (ref 0.0–0.5)
Eosinophils Relative: 0 %
HCT: 31 % — ABNORMAL LOW (ref 39.0–52.0)
Hemoglobin: 10 g/dL — ABNORMAL LOW (ref 13.0–17.0)
Immature Granulocytes: 0 %
Lymphocytes Relative: 3 %
Lymphs Abs: 0.5 10*3/uL — ABNORMAL LOW (ref 0.7–4.0)
MCH: 26.5 pg (ref 26.0–34.0)
MCHC: 32.3 g/dL (ref 30.0–36.0)
MCV: 82.2 fL (ref 80.0–100.0)
Monocytes Absolute: 0.5 10*3/uL (ref 0.1–1.0)
Monocytes Relative: 3 %
Neutro Abs: 15.4 10*3/uL — ABNORMAL HIGH (ref 1.7–7.7)
Neutrophils Relative %: 94 %
Platelets: 317 10*3/uL (ref 150–400)
RBC: 3.77 MIL/uL — ABNORMAL LOW (ref 4.22–5.81)
RDW: 16.4 % — ABNORMAL HIGH (ref 11.5–15.5)
WBC: 16.4 10*3/uL — ABNORMAL HIGH (ref 4.0–10.5)
nRBC: 0 % (ref 0.0–0.2)

## 2020-08-27 LAB — BASIC METABOLIC PANEL
Anion gap: 7 (ref 5–15)
BUN: 16 mg/dL (ref 8–23)
CO2: 23 mmol/L (ref 22–32)
Calcium: 8.9 mg/dL (ref 8.9–10.3)
Chloride: 100 mmol/L (ref 98–111)
Creatinine, Ser: 0.99 mg/dL (ref 0.61–1.24)
GFR, Estimated: >60 mL/min (ref >60)
Glucose, Bld: 204 mg/dL — ABNORMAL HIGH (ref 70–99)
Potassium: 5.4 mmol/L — ABNORMAL HIGH (ref 3.5–5.1)
Sodium: 130 mmol/L — ABNORMAL LOW (ref 135–145)

## 2020-08-27 LAB — LEGIONELLA PNEUMOPHILA SEROGP 1 UR AG: L. pneumophila Serogp 1 Ur Ag: NEGATIVE

## 2020-08-27 LAB — PROCALCITONIN: Procalcitonin: 0.16 ng/mL

## 2020-08-27 LAB — NEURON-SPECIFIC ENOLASE(NSE), BLOOD: Neuron-specific Enolase, Serum: 29.4 ng/mL — ABNORMAL HIGH (ref 0.0–17.6)

## 2020-08-27 LAB — GLUCOSE, CAPILLARY: Glucose-Capillary: 171 mg/dL — ABNORMAL HIGH (ref 70–99)

## 2020-08-27 MED ORDER — IPRATROPIUM-ALBUTEROL 0.5-2.5 (3) MG/3ML IN SOLN
3.0000 mL | Freq: Two times a day (BID) | RESPIRATORY_TRACT | Status: DC
  Administered 2020-08-27: 3 mL via RESPIRATORY_TRACT
  Filled 2020-08-27: qty 3

## 2020-08-27 MED ORDER — FUROSEMIDE 10 MG/ML IJ SOLN
20.0000 mg | Freq: Once | INTRAMUSCULAR | Status: AC
  Administered 2020-08-27: 20 mg via INTRAVENOUS
  Filled 2020-08-27: qty 2

## 2020-08-27 MED ORDER — ENSURE ENLIVE PO LIQD
237.0000 mL | Freq: Three times a day (TID) | ORAL | Status: DC
  Administered 2020-08-27 – 2020-09-10 (×26): 237 mL via ORAL
  Administered 2020-09-10: 150 mL via ORAL

## 2020-08-27 MED ORDER — LACTULOSE 10 GM/15ML PO SOLN
20.0000 g | Freq: Once | ORAL | Status: AC
  Administered 2020-08-27: 20 g via ORAL
  Filled 2020-08-27: qty 30

## 2020-08-27 MED ORDER — ADULT MULTIVITAMIN W/MINERALS CH
1.0000 | ORAL_TABLET | Freq: Every day | ORAL | Status: DC
  Administered 2020-08-27 – 2020-09-10 (×11): 1 via ORAL
  Filled 2020-08-27 (×13): qty 1

## 2020-08-27 MED ORDER — SODIUM ZIRCONIUM CYCLOSILICATE 10 G PO PACK
10.0000 g | PACK | Freq: Once | ORAL | Status: DC
  Filled 2020-08-27: qty 1

## 2020-08-27 NOTE — Progress Notes (Addendum)
PROGRESS NOTE    Grant Taylor  LOV:[id number] DOB: [birth year] DOA: 08/06/2020 PCP: [name], MD   Brief Narrative: 75 year old with past medical history significant for hypertension, COPD, HLD, GERD, BPH, OSA who presents with severe cough, inability to eat, abdominal pain, previous diarrhea.  He was recently discharged with plans to follow-up at the [facility] however, he was not doing well at home with the severe cough.  His oxygen requirement increased at home.  He reports nausea. 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  Patient presented with worsening hypoxic respiratory failure, shortness of breath.  CT angio showed interval development of patchy consolidative opacities throughout the right lung which may represent superimposed infectious process or potentially lymphangitic spread of tumor.  Negative for PE.  Bulky mediastinal adenopathy narrows the right lower lobe pulmonary artery.  Interval increase in size of bulky mediastinal and hilar adenopathy as well as multiple bilateral pulmonary metastases.  Redemonstrated partially visualized large pancreatic mass.   Assessment & Plan:   Principal Problem:   Pneumonia Active Problems:   Acute on chronic respiratory failure with hypoxia (HCC)   Essential hypertension   COPD (chronic obstructive pulmonary disease) (HCC)   Pancreatic mass  1-Acute hypoxic respiratory failure: Could be  secondary to pneumonia versus worsening metastatic cancer, lymphangitic spread of cancer. COPD  -Oxygen requirement today 8 L. -Strep antigen negative, Legionella antigen pending.  MRSA PCR negative. -Continue with Cefepime, discontinue Vancomycin (8/2) MRSA PCR negative. Will add azithromycin and flagyl.   -Pro-calcitonin level. Borderline 0.32--0.43--0.16 -Continue with Scheduled nebulizer every 4 hours. -Scheduled guaifenesin. On PRN codeine.  -Appreciate Dr. Marin Olp follow up. Started on IV steroids.  -WBC down to 16 -Sputum culture growing rare Gram Positive Cocci in pair.  -Discussed with Dr Marin Olp, oncologist from [facility] agrees to proceed with inpatient chemo. Will plan on transfer patient to [facility].  -Will give one time dose of lasix.   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 Pancreatic mass Continue with oxycodone, tizanidine  Depression: Continue with bupropion Continue with hydroxyzine for anxiety  Moderate malnutrition in the setting of malignancy: Continue with dronabinol and Ensure  Hyperkalemia; He will received IV lasix.     Estimated body mass index is 21.14 kg/m as calculated from the following:   Height as of this encounter: 5\' 7"  (1.702 m).   Weight as of this encounter: 61.2 kg.   DVT prophylaxis: Lovenox Code Status: Full code Family Communication: Care discussed with daughter who was at bedside 7/31 Disposition Plan:  Status is: Inpatient  Remains inpatient appropriate because:IV treatments appropriate due to intensity of illness or inability to take PO  Dispo: The patient is from: Home              Anticipated d/c is to: Home              Patient currently is not medically stable to d/c.   Difficult to place patient No        Consultants:  Oncology  Procedures:  None  Antimicrobials:  Vancomycin, cefepime.   Subjective: Cough is not better, Dyspnea improved. No BM in two days.    Objective: Vitals:   08/26/20 1353  08/26/20 1934 08/26/20 2349 08/27/20 0345  BP:   (!) 149/87 (!) 139/93  Pulse:   (!) 104 (!) 102  Resp:   20    Temp:   98.2 F (36.8 C) 97.9 F (36.6 C)  TempSrc:   Oral Oral  SpO2: 94% 91% 92% (!) 89%  Weight:      Height:        Intake/Output Summary (Last 24 hours) at 08/27/2020 0711 Last data filed at 08/27/2020 9629 Gross per 24 hour  Intake 900 ml  Output 1525 ml  Net -625 ml    Filed Weights   08/19/2020 1952  Weight: 61.2 kg    Examination:  General exam: No Acute Distress Respiratory system: BL [place] Cardiovascular system: S 1, S 2 RRR Gastrointestinal system: BS present, soft, nt Central nervous system: Alert and oriented.  Extremities: Symmetric power   Data Reviewed: I have personally reviewed following labs and imaging studies  CBC: Recent Labs  Lab 08/21/20 0723 08/23/2020 1225 08/25/20 0838 08/27/20 0301  WBC 15.5* 22.4* 20.1* 16.4*  NEUTROABS 13.1* 19.6* 17.9* 15.4*  HGB 12.2* 11.1* 10.2* 10.0*  HCT 39.5 34.1* 32.3* 31.0*  MCV 84.9 83.0 84.1 82.2  PLT 622* 437* 383 528    Basic Metabolic Panel: Recent Labs  Lab 08/21/20 0723 08/22/20 0309 08/03/2020 1225 08/25/20 0838 08/27/20 0301  NA 135 136 130* 134* 130*  K 4.8 4.6 4.4 4.3 5.4*  CL 96* 99 95* 99 100  CO2 25 30 27 26 23   GLUCOSE 143* 164* 124* 171* 204*  BUN 20 23 19 15 16   CREATININE 1.20 1.21 1.22 1.20 0.99  CALCIUM 9.9 9.4 8.7* 8.6* 8.9    GFR: Estimated Creatinine Clearance: 54.9 mL/min (by C-G formula based on SCr of 0.99 mg/dL). Liver Function Tests: Recent Labs  Lab 08/21/20 0723 07/29/2020 1225 08/25/20 0838  AST 60* 85* 80*  ALT 50* 50* 46*  ALKPHOS 83 93 89  BILITOT 0.3 0.6 0.7  PROT 7.1 6.3* 5.9*  ALBUMIN 2.9* 2.0* 1.8*    No results for input(s): LIPASE, AMYLASE in the last 168 hours. No results for input(s): AMMONIA in the last 168 hours. Coagulation Profile: Recent Labs  Lab 08/21/20 0928  INR 1.1    Cardiac Enzymes: No results for input(s): CKTOTAL, CKMB, CKMBINDEX, TROPONINI in the last 168 hours. BNP (last 3 results) No results for input(s): PROBNP in  the last 8760 hours. HbA1C: No results for input(s): HGBA1C in the last 72 hours. CBG: Recent Labs  Lab 08/26/20 0733  GLUCAP 114*    Lipid Profile: No results for input(s): CHOL, HDL, LDLCALC, TRIG, CHOLHDL, LDLDIRECT in the last 72 hours. Thyroid Function Tests: No results for input(s): TSH, T4TOTAL, FREET4, T3FREE, THYROIDAB in the last 72 hours. Anemia Panel: No results for input(s): VITAMINB12, FOLATE, FERRITIN, TIBC, IRON, RETICCTPCT in the last 72 hours. Sepsis Labs: Recent Labs  Lab 08/04/2020 1334 08/23/2020 1545 08/25/20 0838 08/26/20 0142 08/27/20 0301  PROCALCITON  --   --  0.32 0.43 0.16  LATICACIDVEN 1.1 1.4  --   --   --      Recent Results (from the past 240 hour(s))  Resp Panel by RT-PCR (Flu A&B, Covid) Nasopharyngeal Swab     Status: None   Collection Time: 08/20/2020  1:34 PM   Specimen: Nasopharyngeal Swab; Nasopharyngeal(NP) swabs in vial transport medium  Result Value Ref Range Status   SARS Coronavirus 2 by RT PCR NEGATIVE NEGATIVE Final    Comment: (NOTE)  SARS-CoV-2 target nucleic acids are NOT DETECTED.  The SARS-CoV-2 RNA is generally detectable in upper respiratory specimens during the acute phase of infection. The lowest concentration of SARS-CoV-2 viral copies this assay can detect is 138 copies/mL. A negative result does not preclude SARS-Cov-2 infection and should not be used as the sole basis for treatment or other patient management decisions. A negative result may occur with  improper specimen collection/handling, submission of specimen other than nasopharyngeal swab, presence of viral mutation(s) within the areas targeted by this assay, and inadequate number of viral copies(<138 copies/mL). A negative result must be combined with clinical observations, patient history, and epidemiological information. The expected result is Negative.  Fact Sheet for Patients:  EntrepreneurPulse.com.au  Fact Sheet for Healthcare  Providers:  IncredibleEmployment.be  This test is no t yet approved or cleared by the [place] FDA and  has been authorized for detection and/or diagnosis of SARS-CoV-2 by FDA under an Emergency Use Authorization (EUA). This EUA will remain  in effect (meaning this test can be used) for the duration of the COVID-19 declaration under Section 564(b)(1) of the Act, 21 U.S.C.section 360bbb-3(b)(1), unless the authorization is terminated  or revoked sooner.       Influenza A by PCR NEGATIVE NEGATIVE Final   Influenza B by PCR NEGATIVE NEGATIVE Final    Comment: (NOTE) The Xpert Xpress SARS-CoV-2/FLU/RSV plus assay is intended as an aid in the diagnosis of influenza from Nasopharyngeal swab specimens and should not be used as a sole basis for treatment. Nasal washings and aspirates are unacceptable for Xpert Xpress SARS-CoV-2/FLU/RSV testing.  Fact Sheet for Patients: EntrepreneurPulse.com.au  Fact Sheet for Healthcare Providers: IncredibleEmployment.be  This test is not yet approved or cleared by the [place] FDA and has been authorized for detection and/or diagnosis of SARS-CoV-2 by FDA under an Emergency Use Authorization (EUA). This EUA will remain in effect (meaning this test can be used) for the duration of the COVID-19 declaration under Section 564(b)(1) of the Act, 21 U.S.C. section 360bbb-3(b)(1), unless the authorization is terminated or revoked.  Performed at [facility], [place] [address], [place], [place] [address]   Blood Culture (routine x 2)     Status: None (Preliminary result)   Collection Time: 07/28/2020  1:34 PM   Specimen: BLOOD RIGHT FOREARM  Result Value Ref Range Status   Specimen Description BLOOD RIGHT FOREARM  Final   Special Requests   Final    BOTTLES DRAWN AEROBIC AND ANAEROBIC Blood Culture adequate volume   Culture   Final    NO GROWTH 2 DAYS Performed at [facility], [place] [address] [place], [place] [address]    Report Status PENDING  Incomplete  Blood Culture (routine x 2)     Status: None (Preliminary result)   Collection Time: 08/20/2020  6:10 PM   Specimen: BLOOD  Result Value Ref Range Status   Specimen Description BLOOD PORTA CATH  Final   Special Requests   Final    BOTTLES DRAWN AEROBIC AND ANAEROBIC Blood Culture adequate volume   Culture   Final    NO GROWTH 2 DAYS Performed at [facility], [place] [address], [place],  [address]    Report Status PENDING  Incomplete  MRSA Next Gen by PCR, Nasal     Status: None   Collection Time: 08/25/20  8:38 AM   Specimen: Nasal Mucosa; Nasal Swab  Result Value Ref Range Status   MRSA by PCR Next  Gen NOT DETECTED NOT DETECTED Final    Comment: (NOTE) The GeneXpert MRSA Assay (FDA approved for NASAL specimens only), is one component of a comprehensive MRSA colonization surveillance program. It is not intended to diagnose MRSA infection nor to guide or monitor treatment for MRSA infections. Test performance is not FDA approved in patients less than 85 years old. Performed at [facility], [place] [address] [place], [place] [address]   Expectorated Sputum Assessment w Gram Stain, Rflx to Resp Cult     Status: None   Collection Time: 08/25/20 10:08 AM   Specimen: Expectorated Sputum  Result Value Ref Range Status   Specimen Description EXPECTORATED SPUTUM  Final   Special Requests NONE  Final   Sputum evaluation   Final    THIS SPECIMEN IS ACCEPTABLE FOR SPUTUM CULTURE Performed at [facility], [place] [address], [place], [place] [address]    Report Status 08/26/2020 FINAL  Final  Culture, Respiratory w Gram Stain     Status: None (Preliminary result)   Collection Time: 08/25/20 10:08 AM  Result Value Ref Range Status   Specimen Description EXPECTORATED SPUTUM  Final   Special Requests NONE Reflexed from [id number]  Final   Gram Stain   Final    RARE WBC PRESENT,BOTH PMN AND  MONONUCLEAR RARE GRAM POSITIVE COCCI IN PAIRS Performed at [facility], [place] [address], [place], [place] [address]    Culture PENDING  Incomplete   Report Status PENDING  Incomplete          Radiology Studies: No results found.      Scheduled Meds:  buPROPion  300 mg Oral Daily   Chlorhexidine Gluconate Cloth  6 each Topical Daily   dronabinol  2.5 mg Oral BID AC   enoxaparin (LOVENOX) injection  40 mg Subcutaneous Daily   feeding supplement  237 mL Oral BID BM   gabapentin  300 mg Oral TID   guaiFENesin  1,200 mg Oral BID   ipratropium-albuterol  3 mL Nebulization TID   loratadine  10 mg Oral Daily   methylPREDNISolone (SOLU-MEDROL) injection  80 mg Intravenous Q6H   pantoprazole  40 mg Oral Daily   polyethylene glycol  17 g Oral Daily   sodium chloride flush  10-40 mL Intracatheter Q12H   sodium chloride flush  3 mL Intravenous Q12H   sodium zirconium cyclosilicate  10 g Oral Once   Continuous Infusions:  azithromycin Stopped (08/26/20 1611)   ceFEPime (MAXIPIME) IV Stopped (08/26/20 2251)   metronidazole 500 mg (08/26/20 2251)     LOS: 3 days    Time spent: 35 minutes    Cipriano Millikan A Kimetha Trulson, MD [facility]   If 7PM-7AM, please contact night-coverage [email or web address]  08/27/2020, 7:11 AM

## 2020-08-27 NOTE — Progress Notes (Signed)
Grant Taylor might be a little better this morning.  He does not sound as wheezy.  We have tried to get hold of the [place] hospital about his treatments.  I have not yet returned a phone call.  Think it is apparent that he is behaving like a small cell carcinoma.  He has disease that is growing relatively quickly.  It is certainly conceivable that we may have to do treatment with him in the hospital to try to help decrease the bulk of his disease.  It certainly could be causing his issues with respect to breathing.  His white cell count 16.4.  Hemoglobin 10.  Platelet count 317,000.  I have him on some steroids to try to help with the inflammation.  His blood sugars are little elevated.  Cultures are still unremarkable.  We will try to get a hold of the [place] hospital again today.  His vital signs are temperature of 97.9.  Pulse 102.  Blood pressure 139/93.  Oxygen saturation is 89%.  His lungs sound nice in clear on the left side.  He has decreased wheezing.  There is also decrease on the right side.  Cardiac exam regular rate and rhythm.  Abdomen is soft.  Neurological exam is nonfocal.  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  I remember speaking to the pathologist.  She really cannot give me much more information.  I tried to send off molecular markers on the tumor.  However, the [place] was not going to pay for that.  Hopefully, we will hear from the [facility] hospital today.  Hopefully we can get hold of his oncologist.  I think he would like to have an update.  Lattie Haw, MD  Psalm [phone]

## 2020-08-27 NOTE — Progress Notes (Signed)
Called report and given to Arboriculturist at [employer].

## 2020-08-27 NOTE — Progress Notes (Signed)
Initial Nutrition Assessment  DOCUMENTATION CODES:   Severe malnutrition in context of chronic illness  INTERVENTION:  -Ensure Enlive po TID, each supplement provides 350 kcal and 20 grams of protein -MVI with minerals daily  NUTRITION DIAGNOSIS:   Severe Malnutrition related to chronic illness (metastatic cancer) as evidenced by severe fat depletion, severe muscle depletion.  GOAL:   Patient will meet greater than or equal to 90% of their needs  MONITOR:   PO intake, Supplement acceptance, Labs, Weight trends, I & O's  REASON FOR ASSESSMENT:   Malnutrition Screening Tool    ASSESSMENT:   Pt readmitted for acute hypoxic respiratory failure 2/2 COPD exacerbation vs PNA vs worsening metastatic cancer/lymphangitic spread of cancer. 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  Pt initially c/o severe cough, inability to eat 2/2 nausea, abdominal pain, and diarrhea upon admission. Today, pt endorses continued abdominal pain but reports feeling a little better and breathing a little easier. Pt tolerating po diet, though no PO intake has been documented thus far. Pt with orders for Ensure Enlive/Plus BID.   Per weight readings, pt has experienced a 7.7% weight loss over the last 6 months, which is insignificant for time frame.   UOP: 1579ml x24 hours I/O:-980ml since admit  Medications: marinol, solu-medrol, protonix, miralax, lokelma, IV abx Labs: Na 130 (L), K+ 5.4 (H) CBGs 114-171  NUTRITION - FOCUSED PHYSICAL EXAM:  Flowsheet Row Most Recent Value  Orbital Region Moderate depletion  Upper Arm Region Severe depletion  Thoracic and Lumbar Region Severe depletion  Buccal Region Severe depletion   Temple Region Severe depletion  Clavicle Bone Region Severe depletion  Clavicle and Acromion Bone Region Severe depletion  Scapular Bone Region Severe depletion  Dorsal Hand Moderate depletion  Patellar Region Severe depletion  Anterior Thigh Region Severe depletion  Posterior Calf Region Severe depletion  Edema (RD Assessment) None  Hair Reviewed  Eyes Reviewed  Mouth Reviewed  Skin Reviewed  Nails Reviewed       Diet Order:   Diet Order             Diet regular Room service appropriate? Yes; Fluid consistency: Thin  Diet effective now                   EDUCATION NEEDS:   No education needs have been identified at this time  Skin:  Skin Assessment: Reviewed RN Assessment  Last BM:  7/31  Height:   Ht Readings from Last 1 Encounters:  08/02/2020 5\' 7"  (1.702 m)    Weight:   Wt Readings from Last 7 Encounters:  07/26/2020 61.2 kg  08/09/20 63.8 kg  07/02/20 64 kg  05/23/20 64 kg  02/29/20 67.1 kg  02/06/20 66.3 kg  01/04/20 65.3 kg   BMI:  Body mass index is 21.14 kg/m.  Estimated Nutritional Needs:   Kcal:  1950-2150  Protein:  95-110 grams  Fluid:  >2L    Larkin Ina, MS, RD, LDN (she/her/hers) RD pager number and weekend/on-call pager number located in [place].

## 2020-08-28 ENCOUNTER — Encounter (HOSPITAL_COMMUNITY): Payer: Self-pay | Admitting: Internal Medicine

## 2020-08-28 ENCOUNTER — Inpatient Hospital Stay (HOSPITAL_COMMUNITY): Payer: No Typology Code available for payment source

## 2020-08-28 DIAGNOSIS — I10 Essential (primary) hypertension: Secondary | ICD-10-CM

## 2020-08-28 DIAGNOSIS — J189 Pneumonia, unspecified organism: Secondary | ICD-10-CM | POA: Diagnosis not present

## 2020-08-28 DIAGNOSIS — J440 Chronic obstructive pulmonary disease with acute lower respiratory infection: Secondary | ICD-10-CM

## 2020-08-28 DIAGNOSIS — F32A Depression, unspecified: Secondary | ICD-10-CM

## 2020-08-28 DIAGNOSIS — R0902 Hypoxemia: Secondary | ICD-10-CM | POA: Diagnosis not present

## 2020-08-28 DIAGNOSIS — J9621 Acute and chronic respiratory failure with hypoxia: Secondary | ICD-10-CM | POA: Diagnosis not present

## 2020-08-28 DIAGNOSIS — C801 Malignant (primary) neoplasm, unspecified: Secondary | ICD-10-CM

## 2020-08-28 DIAGNOSIS — D72829 Elevated white blood cell count, unspecified: Secondary | ICD-10-CM

## 2020-08-28 DIAGNOSIS — R0603 Acute respiratory distress: Secondary | ICD-10-CM | POA: Diagnosis not present

## 2020-08-28 HISTORY — DX: Malignant (primary) neoplasm, unspecified: C80.1

## 2020-08-28 LAB — CULTURE, RESPIRATORY W GRAM STAIN

## 2020-08-28 LAB — GLUCOSE, CAPILLARY
Glucose-Capillary: 198 mg/dL — ABNORMAL HIGH (ref 70–99)
Glucose-Capillary: 200 mg/dL — ABNORMAL HIGH (ref 70–99)

## 2020-08-28 LAB — CBC WITH DIFFERENTIAL/PLATELET
Abs Immature Granulocytes: 0.15 10*3/uL — ABNORMAL HIGH (ref 0.00–0.07)
Basophils Absolute: 0 10*3/uL (ref 0.0–0.1)
Basophils Relative: 0 %
Eosinophils Absolute: 0 10*3/uL (ref 0.0–0.5)
Eosinophils Relative: 0 %
HCT: 35.1 % — ABNORMAL LOW (ref 39.0–52.0)
Hemoglobin: 11.1 g/dL — ABNORMAL LOW (ref 13.0–17.0)
Immature Granulocytes: 1 %
Lymphocytes Relative: 2 %
Lymphs Abs: 0.3 10*3/uL — ABNORMAL LOW (ref 0.7–4.0)
MCH: 26.6 pg (ref 26.0–34.0)
MCHC: 31.6 g/dL (ref 30.0–36.0)
MCV: 84 fL (ref 80.0–100.0)
Monocytes Absolute: 0.5 10*3/uL (ref 0.1–1.0)
Monocytes Relative: 2 %
Neutro Abs: 21 10*3/uL — ABNORMAL HIGH (ref 1.7–7.7)
Neutrophils Relative %: 95 %
Platelets: 368 10*3/uL (ref 150–400)
RBC: 4.18 MIL/uL — ABNORMAL LOW (ref 4.22–5.81)
RDW: 16.8 % — ABNORMAL HIGH (ref 11.5–15.5)
WBC: 22 10*3/uL — ABNORMAL HIGH (ref 4.0–10.5)
nRBC: 0 % (ref 0.0–0.2)

## 2020-08-28 LAB — BASIC METABOLIC PANEL
Anion gap: 13 (ref 5–15)
BUN: 19 mg/dL (ref 8–23)
CO2: 26 mmol/L (ref 22–32)
Calcium: 9.5 mg/dL (ref 8.9–10.3)
Chloride: 99 mmol/L (ref 98–111)
Creatinine, Ser: 0.94 mg/dL (ref 0.61–1.24)
GFR, Estimated: >60 mL/min (ref >60)
Glucose, Bld: 173 mg/dL — ABNORMAL HIGH (ref 70–99)
Potassium: 4 mmol/L (ref 3.5–5.1)
Sodium: 138 mmol/L (ref 135–145)

## 2020-08-28 LAB — MAGNESIUM: Magnesium: 2 mg/dL (ref 1.7–2.4)

## 2020-08-28 MED ORDER — BUDESONIDE 0.5 MG/2ML IN SUSP
0.5000 mg | Freq: Two times a day (BID) | RESPIRATORY_TRACT | Status: DC
  Administered 2020-08-28 – 2020-09-11 (×27): 0.5 mg via RESPIRATORY_TRACT
  Filled 2020-08-28 (×29): qty 2

## 2020-08-28 MED ORDER — INSULIN ASPART 100 UNIT/ML IJ SOLN
0.0000 [IU] | Freq: Three times a day (TID) | INTRAMUSCULAR | Status: DC
  Administered 2020-08-29: 5 [IU] via SUBCUTANEOUS
  Administered 2020-08-29 – 2020-08-30 (×2): 2 [IU] via SUBCUTANEOUS
  Administered 2020-08-30: 3 [IU] via SUBCUTANEOUS
  Administered 2020-08-30: 11 [IU] via SUBCUTANEOUS
  Administered 2020-08-31: 3 [IU] via SUBCUTANEOUS
  Administered 2020-08-31: 2 [IU] via SUBCUTANEOUS
  Administered 2020-09-01: 8 [IU] via SUBCUTANEOUS
  Administered 2020-09-01: 2 [IU] via SUBCUTANEOUS
  Administered 2020-09-01: 3 [IU] via SUBCUTANEOUS
  Administered 2020-09-02: 2 [IU] via SUBCUTANEOUS
  Administered 2020-09-02: 5 [IU] via SUBCUTANEOUS
  Administered 2020-09-02 – 2020-09-03 (×2): 2 [IU] via SUBCUTANEOUS
  Administered 2020-09-03: 5 [IU] via SUBCUTANEOUS
  Administered 2020-09-04: 3 [IU] via SUBCUTANEOUS
  Administered 2020-09-04: 8 [IU] via SUBCUTANEOUS
  Administered 2020-09-05 – 2020-09-06 (×3): 3 [IU] via SUBCUTANEOUS
  Administered 2020-09-06: 11 [IU] via SUBCUTANEOUS
  Administered 2020-09-07: 3 [IU] via SUBCUTANEOUS
  Administered 2020-09-07: 11 [IU] via SUBCUTANEOUS
  Administered 2020-09-07: 3 [IU] via SUBCUTANEOUS
  Administered 2020-09-08: 5 [IU] via SUBCUTANEOUS
  Administered 2020-09-08: 3 [IU] via SUBCUTANEOUS
  Administered 2020-09-09: 15 [IU] via SUBCUTANEOUS
  Administered 2020-09-09: 5 [IU] via SUBCUTANEOUS
  Administered 2020-09-09: 8 [IU] via SUBCUTANEOUS
  Administered 2020-09-10: 3 [IU] via SUBCUTANEOUS
  Administered 2020-09-10: 5 [IU] via SUBCUTANEOUS

## 2020-08-28 MED ORDER — AMPICILLIN-SULBACTAM SODIUM 3 (2-1) G IJ SOLR
3.0000 g | Freq: Four times a day (QID) | INTRAMUSCULAR | Status: AC
  Administered 2020-08-28 – 2020-09-04 (×27): 3 g via INTRAVENOUS
  Filled 2020-08-28 (×2): qty 8
  Filled 2020-08-28: qty 3
  Filled 2020-08-28: qty 8
  Filled 2020-08-28: qty 3
  Filled 2020-08-28: qty 8
  Filled 2020-08-28: qty 3
  Filled 2020-08-28: qty 8
  Filled 2020-08-28: qty 3
  Filled 2020-08-28: qty 8
  Filled 2020-08-28: qty 3
  Filled 2020-08-28 (×3): qty 8
  Filled 2020-08-28: qty 3
  Filled 2020-08-28 (×3): qty 8
  Filled 2020-08-28 (×3): qty 3
  Filled 2020-08-28: qty 8
  Filled 2020-08-28 (×3): qty 3
  Filled 2020-08-28: qty 8
  Filled 2020-08-28: qty 3
  Filled 2020-08-28: qty 8
  Filled 2020-08-28: qty 3
  Filled 2020-08-28: qty 8

## 2020-08-28 MED ORDER — IPRATROPIUM-ALBUTEROL 0.5-2.5 (3) MG/3ML IN SOLN
3.0000 mL | Freq: Three times a day (TID) | RESPIRATORY_TRACT | Status: DC
  Administered 2020-08-28 (×3): 3 mL via RESPIRATORY_TRACT
  Filled 2020-08-28 (×4): qty 3

## 2020-08-28 MED ORDER — AMLODIPINE BESYLATE 10 MG PO TABS
10.0000 mg | ORAL_TABLET | Freq: Every day | ORAL | Status: DC
  Administered 2020-08-28: 10 mg via ORAL
  Filled 2020-08-28: qty 1

## 2020-08-28 NOTE — Progress Notes (Signed)
Pharmacy Antibiotic Note  Grant Taylor is a 75 y.o. male with metastatic cancer with plan to start chemotherapy on 8/3.  He's currently on  cefepime, azithromycin and flagyl for PNA.  Sputum culture from 7/31 now has rare  enterococcus faecalis.  Pharmacy has been consulted to change abx to unasyn.  Plan: - unasyn 3gm IV q6h - pharmacy will sign off for abx.  Re-consult [place] if need further assistance  _______________________  Height: 5\' 7"  (170.2 cm) Weight: 58.7 kg (129 lb 4.8 oz) IBW/kg (Calculated) : 66.1  Temp (24hrs), Avg:97.9 F (36.6 C), Min:97.5 F (36.4 C), Max:98.2 F (36.8 C)  Recent Labs  Lab 08/22/20 0309 08/10/2020 1225 08/25/2020 1334 08/22/2020 1545 08/25/20 0838 08/27/20 0301  WBC  --  22.4*  --   --  20.1* 16.4*  CREATININE 1.21 1.22  --   --  1.20 0.99  LATICACIDVEN  --   --  1.1 1.4  --   --     Estimated Creatinine Clearance: 52.7 mL/min (by C-G formula based on SCr of 0.99 mg/dL).    Allergies  Allergen Reactions   Levofloxacin Rash    Other reaction(s): Angioedema   Lisinopril Cough    Other reaction(s): Angioedema   Diclofenac     Other reaction(s): Lip swelling   Dust Mite Extract Nausea And Vomiting   Elemental Sulfur    Etodolac Nausea And Vomiting    Other reaction(s): Facial swelling   No Known Allergies     Other reaction(s): ANGIOEDEMA OF LIPS, Angioedema of tongue, ANGIOEDEMA OF LIPS, Angioedema of tongue   Tolterodine    Citalopram Other (See Comments)    Other reaction(s): Drowsy   Other Rash     Thank you for allowing pharmacy to be a part of this patient's care.  Lynelle Doctor 08/28/2020 8:47 AM

## 2020-08-28 NOTE — Progress Notes (Signed)
Pt requested that his Duoneb frequency be changed back to TID from BID

## 2020-08-28 NOTE — Progress Notes (Signed)
Thankfully, is able to speak to his [facility] oncologist.  He told me that we go ahead and do his first treatment as an inpatient since he is in the hospital.  We have to get Mr. Saephan up to 6 E.  Hopefully, we can get him up there and start chemotherapy on him tomorrow.  He has what certainly appears to be a small cell carcinoma.  Whether this is a small cell pancreatic carcinoma or small cell lung cancer is hard to say.  He does have an elevated neuron-specific enolase which goes along with a small cell carcinoma.  He is still having some shortness of breath.  I think he probably still desaturates when he tries to exercise or walk.  I suspect that the adenopathy in his chest might be pressing on his airways that could be causing issues.  He is not having any problems with pain.  There is no nausea or vomiting.  He has had no bleeding.  There is no lab work on him today.  His vital signs show temperature 97.5.  Pulse 106.  Blood pressure 144/92.  Oxygen saturations 90%.  His lungs sound decent on the left side.  He has fairly good air movement.  There may be couple wheezes.  There is decreased on the right side.  Cardiac exam is tachycardic but regular.  Abdomen is soft.  He has decent bowel sounds.  There is no guarding or rebound tenderness.  Extremity shows no clubbing, cyanosis or edema.  Neurological exam is nonfocal.  We we will go ahead and try to start treatment on him on Thursday.  I would use carboplatinum/etoposide.  I also would like to add immunotherapy but for some reason, we cannot do immunotherapy as an inpatient.  I talked to him about treatment.  He understands treatment.  Understands the side effects.  I know that he has "pneumonia" so we will get had to be cautious with respect to infectious risk.  He will clearly need to be on prophylactic antibiotics.  He will also need to have Neulasta after treatment.  He understands all this..  I also spoke to his daughter yesterday.  I  explained to her what we wanted to do.  She was very grateful for our efforts to try to treat her father.  She agrees to go ahead with treatment.  Again, we have to get him up to 6 E.  I have already written the orders for chemotherapy.  He already has a Port-A-Cath in.  I do appreciate the outstanding care he is getting from everybody.  I do appreciate all the hard work and trying to get him to start his treatment.  I know that treatment should help him.  If we can decrease his tumor burden, then I think his pulmonary status will improve.  Lattie Haw, MD  Psalm [phone]

## 2020-08-28 NOTE — Plan of Care (Signed)
  Problem: Education: Goal: Knowledge of General Education information will improve Description: Including pain rating scale, medication(s)/side effects and non-pharmacologic comfort measures Outcome: Progressing   Problem: Safety: Goal: Ability to remain free from injury will improve Outcome: Progressing   

## 2020-08-28 NOTE — TOC Initial Note (Signed)
Transition of Care [facility]) - Initial/Assessment Note    Patient Details  Name: Grant Taylor MRN: [id number] Date of Birth: 11-21-1945  Transition of Care [facility]) CM/SW Contact:    Dessa Phi, RN Phone Number: 08/28/2020, 2:35 PM  Clinical Narrative: Spoke to dtr Tralishia(Nicole) dtr [phone] about d/c plans-d/c plan home;resume [facility] HHPT/OT. PT cons await recc. [facility]-delivers [facility] home). [facility] home [facility] @ d/c-[phone]. [facility] [facility]pcp Christopher Jones;Jasmine Johnson(SW) tel#[phone] left vm about PTAR may be needed @ d/c. Continue to monitor.                 Expected Discharge Plan: [place] Barriers to Discharge: Continued Medical Work up   Patient Goals and CMS Choice     Choice offered to / list presented to : Adult Children (Tralishia(Nicole) dtr [phone])  Expected Discharge Plan and Services Expected Discharge Plan: [place]   Discharge Planning Services: CM Consult Post Acute Care Choice: Durable Medical Equipment, Home Health Living arrangements for the past 2 months: Single Family Home                                      Prior Living Arrangements/Services Living arrangements for the past 2 months: Single Family Home Lives with:: Adult Children Patient language and need for interpreter reviewed:: Yes Do you feel safe going back to the place where you live?: Yes      Need for Family Participation in Patient Care: No (Comment) Care giver support system in place?: Yes (comment) Current home services: DME, Home OT, Home PT (rw,[facility]-home 02,has travel tank;Active w/[facility] HHPT/OT.) Criminal Activity/Legal Involvement Pertinent to Current Situation/Hospitalization: No - Comment as needed  Activities of Daily Living Home Assistive Devices/Equipment: Eyeglasses, Dentures (specify type), Cane (specify quad or straight),  Oxygen ADL Screening (condition at time of admission) Patient's cognitive ability adequate to safely complete daily activities?: Yes Is the patient deaf or have difficulty hearing?: No Does the patient have difficulty seeing, even when wearing glasses/contacts?: No Does the patient have difficulty concentrating, remembering, or making decisions?: No Patient able to express need for assistance with ADLs?: Yes Does the patient have difficulty dressing or bathing?: No Independently performs ADLs?: Yes (appropriate for developmental age) Communication: Independent Dressing (OT): Independent Grooming: Independent Feeding: Independent Bathing: Independent Toileting: Independent with device (comment) Walks in Home: Independent with device (comment) Does the patient have difficulty walking or climbing stairs?: Yes Weakness of Legs: Both Weakness of Arms/Hands: Both  Permission Sought/Granted Permission sought to share information with : Case Manager Permission granted to share information with : Yes, Verbal Permission Granted  Share Information with NAME: Case Manager     Permission granted to share info w Relationship: Tralishia(Nicole)dtr [phone]     Emotional Assessment Appearance:: Appears stated age Attitude/Demeanor/Rapport: Gracious Affect (typically observed): Accepting Orientation: : Oriented to Self, Oriented to Place, Oriented to  Time, Oriented to Situation Alcohol / Substance Use: Not Applicable Psych Involvement: No (comment)  Admission diagnosis:  Pneumonia [J18.9] Acute on chronic respiratory failure with hypoxia (HCC) [J96.21] Patient Active Problem List   Diagnosis Date Noted   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, [place] - [address] [place] [address] Phone: [phone] Fax: [phone]     Social Determinants of Health (SDOH) Interventions    Readmission Risk Interventions No flowsheet data found.

## 2020-08-28 NOTE — Progress Notes (Signed)
PROGRESS NOTE    Grant Taylor  QRF:[id number] DOB: 07-May-1945 DOA: 08/12/2020 PCP: [name], MD   Chief Complaint  Patient presents with   Shortness of Breath    Brief Narrative:  75 year old with past medical history significant for hypertension, COPD, HLD, GERD, BPH, OSA who presents with severe cough, inability to eat, abdominal pain, previous diarrhea.  He was recently discharged with plans to follow-up at the [facility] however, he was not doing well at home with the severe cough.  His oxygen requirement increased at home.  He reports nausea. 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   Patient presented with worsening hypoxic respiratory failure, shortness of breath.  CT angio showed interval development of patchy consolidative opacities throughout the right lung which may represent superimposed infectious process or potentially lymphangitic spread of tumor.  Negative for PE.  Bulky mediastinal adenopathy narrows the right lower lobe pulmonary artery.  Interval increase in size of bulky mediastinal and hilar adenopathy as well as multiple bilateral pulmonary metastases.  Redemonstrated partially visualized large pancreatic mass.     Assessment & Plan:   Principal Problem:   Pneumonia Active Problems:   Acute on chronic respiratory failure with hypoxia (HCC)   Essential hypertension   COPD (chronic obstructive pulmonary disease) (HCC)   Pancreatic mass   Protein-calorie malnutrition, severe   Small cell carcinoma (HCC)  #1 acute hypoxic respiratory failure likely multifactorial secondary to pneumonia in the setting of worsening metastatic cancer/lymphangitic spread of cancer/COPD -Patient with increasing O2 requirements up to 12 L high flow  nasal cannula. -Urine strep pneumococcus antigen negative.  Urine Legionella antigen negative.  MRSA PCR negative. -Sputum with rare Enterococcus faecalis, no staph orders isolated. -Procalcitonin noted to be borderline. -Discontinue IV cefepime, IV azithromycin, IV Flagyl. -IV vancomycin has been discontinued. -Place on empiric IV Unasyn. -Continue scheduled nebs, Mucinex, Claritin, PPI, IV Solu-Medrol. -Place on Pulmicort nebs. -Patient seen in consultation and being followed by oncology Dr. Marin Olp who discussed with patient's oncologist from [facility] and decision made to start inpatient chemotherapy to be started tomorrow 08/29/2020. -Due to increased O2 requirements will place in the progressive care unit for closer monitoring. -Oncology following and appreciate input and recommendations.  2.  Hypertension -BP initially noted to be soft and as such amlodipine held. -Resume home regimen amlodipine.  3.  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 -Patient is to be started on chemotherapy tomorrow per oncology. -Per oncology.  4.  Depression -Wellbutrin. -Hydroxyzine for anxiety.  5.  Moderate protein calorie malnutrition in the setting of malignancy -Continue dronabinol -Continue nutritional supplementation with Ensure.  6.  Hyperkalemia -Status post 1 dose IV Lasix. -Resolved.  7.  Leukocytosis -Patient on IV Solu-Medrol. -Continue IV antibiotics for problem #1. -Place on SSI   DVT prophylaxis: Lovenox Code Status: Full Family Communication: Updated patient.  No family at bedside. Disposition:   Status is: Inpatient  Remains inpatient appropriate because:Inpatient level of care appropriate due to severity of illness  Dispo: The patient is from: Home              Anticipated d/c  is to: TBD  Patient currently is not medically stable to d/c.   Difficult to place patient No       Consultants:  Oncology: Dr. Marin Olp  Procedures:  CT angiogram chest 08/02/2020 Chest x-ray 08/12/2020, 08/28/2020  Antimicrobials:  IV Unasyn 08/28/2020>>>>> IV azithromycin 08/26/2020>>>>> 08/28/2020 IV cefepime 08/05/2020>>>>> 08/28/2020 IV Flagyl 08/26/2020>>>> 08/28/2020 IV vancomycin 08/23/2020>>>> 08/26/2020   Subjective: Patient with c/o SOB on minimal exertion. On 12LHF [place] witrh sats of 89-90%  Objective: Vitals:   08/27/20 1957 08/28/20 0411 08/28/20 0732 08/28/20 0922  BP: (!) 154/96 (!) 144/92    Pulse: (!) 110 (!) 106 (!) 107 (!) 116  Resp: 20 20 16 20   Temp: 98 F (36.7 C) (!) 97.5 F (36.4 C)    TempSrc:      SpO2: 92% 93% 91% (!) 89%  Weight:      Height:        Intake/Output Summary (Last 24 hours) at 08/28/2020 1206 Last data filed at 08/28/2020 1057 Gross per 24 hour  Intake 1060.83 ml  Output 2575 ml  Net -1514.17 ml   Filed Weights   08/11/2020 1952 08/27/20 1559  Weight: 61.2 kg 58.7 kg    Examination:  General exam: Appears calm and comfortable  Respiratory system: Corase diffuse BS.  No crackles, no wheezing.  On 12 L high flow nasal cannula.  Cardiovascular system: Tachycardia.  No JVD, murmurs, rubs, gallops or clicks. No pedal edema. Gastrointestinal system: Abdomen is nondistended, soft and nontender. No organomegaly or masses felt. Normal bowel sounds heard. Central nervous system: Alert and oriented. No focal neurological deficits. Extremities: Symmetric 5 x 5 power. Skin: No rashes, lesions or ulcers Psychiatry: Judgement and insight appear normal. Mood & affect appropriate.     Data Reviewed: I have personally reviewed following labs and imaging studies  CBC: Recent Labs  Lab 08/03/2020 1225 08/25/20 0838 08/27/20 0301 08/28/20 0919  WBC 22.4* 20.1* 16.4* 22.0*  NEUTROABS 19.6* 17.9* 15.4* 21.0*  HGB 11.1* 10.2* 10.0* 11.1*   HCT 34.1* 32.3* 31.0* 35.1*  MCV 83.0 84.1 82.2 84.0  PLT 437* 383 317 161    Basic Metabolic Panel: Recent Labs  Lab 08/22/20 0309 08/07/2020 1225 08/25/20 0838 08/27/20 0301 08/28/20 0919  NA 136 130* 134* 130* 138  K 4.6 4.4 4.3 5.4* 4.0  CL 99 95* 99 100 99  CO2 30 27 26 23 26   GLUCOSE 164* 124* 171* 204* 173*  BUN 23 19 15 16 19   CREATININE 1.21 1.22 1.20 0.99 0.94  CALCIUM 9.4 8.7* 8.6* 8.9 9.5  MG  --   --   --   --  2.0    GFR: Estimated Creatinine Clearance: 55.5 mL/min (by C-G formula based on SCr of 0.94 mg/dL).  Liver Function Tests: Recent Labs  Lab 08/12/2020 1225 08/25/20 0838  AST 85* 80*  ALT 50* 46*  ALKPHOS 93 89  BILITOT 0.6 0.7  PROT 6.3* 5.9*  ALBUMIN 2.0* 1.8*    CBG: Recent Labs  Lab 08/26/20 0733 08/27/20 0835 08/28/20 0731  GLUCAP 114* 171* 198*     Recent Results (from the past 240 hour(s))  Resp Panel by RT-PCR (Flu A&B, Covid) Nasopharyngeal Swab     Status: None   Collection Time: 08/04/2020  1:34 PM   Specimen: Nasopharyngeal Swab; Nasopharyngeal(NP) swabs in vial transport medium  Result Value Ref Range Status   SARS Coronavirus 2 by RT PCR NEGATIVE NEGATIVE Final    Comment: (NOTE) SARS-CoV-2 target nucleic acids are NOT DETECTED.  The SARS-CoV-2 RNA is generally detectable in upper respiratory specimens during the acute phase of infection. The lowest concentration of SARS-CoV-2 viral copies this assay can detect is 138 copies/mL. A negative result does not preclude SARS-Cov-2 infection and should not be used as the sole basis for treatment or other patient management decisions. A negative result may occur with  improper specimen collection/handling, submission of specimen other than nasopharyngeal swab, presence of viral mutation(s) within the areas targeted by this assay, and inadequate number of viral copies(<138 copies/mL). A negative result must be combined with clinical observations, patient history, and  epidemiological information. The expected result is Negative.  Fact Sheet for Patients:  EntrepreneurPulse.com.au  Fact Sheet for Healthcare Providers:  IncredibleEmployment.be  This test is no t yet approved or cleared by the [place] FDA and  has been authorized for detection and/or diagnosis of SARS-CoV-2 by FDA under an Emergency Use Authorization (EUA). This EUA will remain  in effect (meaning this test can be used) for the duration of the COVID-19 declaration under Section 564(b)(1) of the Act, 21 U.S.C.section 360bbb-3(b)(1), unless the authorization is terminated  or revoked sooner.       Influenza A by PCR NEGATIVE NEGATIVE Final   Influenza B by PCR NEGATIVE NEGATIVE Final    Comment: (NOTE) The Xpert Xpress SARS-CoV-2/FLU/RSV plus assay is intended as an aid in the diagnosis of influenza from Nasopharyngeal swab specimens and should not be used as a sole basis for treatment. Nasal washings and aspirates are unacceptable for Xpert Xpress SARS-CoV-2/FLU/RSV testing.  Fact Sheet for Patients: EntrepreneurPulse.com.au  Fact Sheet for Healthcare Providers: IncredibleEmployment.be  This test is not yet approved or cleared by the [place] FDA and has been authorized for detection and/or diagnosis of SARS-CoV-2 by FDA under an Emergency Use Authorization (EUA). This EUA will remain in effect (meaning this test can be used) for the duration of the COVID-19 declaration under Section 564(b)(1) of the Act, 21 U.S.C. section 360bbb-3(b)(1), unless the authorization is terminated or revoked.  Performed at [facility], [place] [address] [place], [place] [address]   Blood Culture (routine x 2)     Status: None (Preliminary result)   Collection Time: 08/23/2020  1:34 PM   Specimen: BLOOD RIGHT FOREARM  Result Value Ref Range Status   Specimen Description BLOOD RIGHT FOREARM  Final   Special  Requests   Final    BOTTLES DRAWN AEROBIC AND ANAEROBIC Blood Culture adequate volume   Culture   Final    NO GROWTH 4 DAYS Performed at [facility], [place] [address], [place], [place] [address]    Report Status PENDING  Incomplete  Blood Culture (routine x 2)     Status: None (Preliminary result)   Collection Time: 08/03/2020  6:10 PM   Specimen: BLOOD  Result Value Ref Range Status   Specimen Description BLOOD PORTA CATH  Final   Special Requests   Final    BOTTLES DRAWN AEROBIC AND ANAEROBIC Blood Culture adequate volume   Culture   Final    NO GROWTH 4 DAYS Performed at [facility], [place] [address], [place], [place] [address]    Report Status PENDING  Incomplete  MRSA Next Gen by PCR, Nasal     Status: None   Collection Time: 08/25/20  8:38 AM   Specimen: Nasal Mucosa; Nasal Swab  Result Value Ref Range Status   MRSA by PCR Next Gen NOT DETECTED NOT DETECTED Final  Comment: (NOTE) The GeneXpert MRSA Assay (FDA approved for NASAL specimens only), is one component of a comprehensive MRSA colonization surveillance program. It is not intended to diagnose MRSA infection nor to guide or monitor treatment for MRSA infections. Test performance is not FDA approved in patients less than 46 years old. Performed at [facility], [place] [address], [place], [place] [address]   Expectorated Sputum Assessment w Gram Stain, Rflx to Resp Cult     Status: None   Collection Time: 08/25/20 10:08 AM   Specimen: Expectorated Sputum  Result Value Ref Range Status   Specimen Description EXPECTORATED SPUTUM  Final   Special Requests NONE  Final   Sputum evaluation   Final    THIS SPECIMEN IS ACCEPTABLE FOR SPUTUM CULTURE Performed at [facility], [place] [address] [place], [place] [address]    Report Status 08/26/2020 FINAL  Final  Culture, Respiratory w Gram Stain     Status: None   Collection Time: 08/25/20 10:08 AM  Result Value Ref Range Status   Specimen Description  EXPECTORATED SPUTUM  Final   Special Requests NONE Reflexed from [id number]  Final   Gram Stain   Final    RARE WBC PRESENT,BOTH PMN AND MONONUCLEAR RARE GRAM POSITIVE COCCI IN PAIRS    Culture   Final    RARE ENTEROCOCCUS FAECALIS NO STAPHYLOCOCCUS AUREUS ISOLATED No Pseudomonas species isolated Performed at [facility], [place] [address] [place], [place] [address]    Report Status 08/28/2020 FINAL  Final   Organism ID, Bacteria ENTEROCOCCUS FAECALIS  Final      Susceptibility   Enterococcus faecalis - MIC*    AMPICILLIN <=2 SENSITIVE Sensitive     VANCOMYCIN 1 SENSITIVE Sensitive     GENTAMICIN SYNERGY SENSITIVE Sensitive     * RARE ENTEROCOCCUS FAECALIS         Radiology Studies: No results found.      Scheduled Meds:  budesonide (PULMICORT) nebulizer solution  0.5 mg Nebulization BID   buPROPion  300 mg Oral Daily   Chlorhexidine Gluconate Cloth  6 each Topical Daily   dronabinol  2.5 mg Oral BID AC   enoxaparin (LOVENOX) injection  40 mg Subcutaneous Daily   feeding supplement  237 mL Oral TID BM   gabapentin  300 mg Oral TID   guaiFENesin  1,200 mg Oral BID   ipratropium-albuterol  3 mL Nebulization TID   loratadine  10 mg Oral Daily   methylPREDNISolone (SOLU-MEDROL) injection  80 mg Intravenous Q6H   multivitamin with minerals  1 tablet Oral Daily   pantoprazole  40 mg Oral Daily   polyethylene glycol  17 g Oral Daily   sodium chloride flush  10-40 mL Intracatheter Q12H   sodium chloride flush  3 mL Intravenous Q12H   sodium zirconium cyclosilicate  10 g Oral Once   Continuous Infusions:  ampicillin-sulbactam (UNASYN) IV 3 g (08/28/20 1056)     LOS: 4 days    Time spent: 40 minutes    Irine Seal, MD [facility]   To contact the attending provider between 7A-7P or the covering provider during after hours 7P-7A, please log into the web site [email or web address] and access using universal [place] password for that web site. If you do  not have the password, please call the hospital operator.  08/28/2020, 12:06 PM

## 2020-08-29 ENCOUNTER — Inpatient Hospital Stay (HOSPITAL_COMMUNITY): Payer: No Typology Code available for payment source

## 2020-08-29 DIAGNOSIS — I4891 Unspecified atrial fibrillation: Secondary | ICD-10-CM

## 2020-08-29 DIAGNOSIS — E43 Unspecified severe protein-calorie malnutrition: Secondary | ICD-10-CM | POA: Diagnosis not present

## 2020-08-29 DIAGNOSIS — R0602 Shortness of breath: Secondary | ICD-10-CM | POA: Diagnosis not present

## 2020-08-29 DIAGNOSIS — J189 Pneumonia, unspecified organism: Secondary | ICD-10-CM | POA: Diagnosis not present

## 2020-08-29 DIAGNOSIS — J9621 Acute and chronic respiratory failure with hypoxia: Secondary | ICD-10-CM | POA: Diagnosis not present

## 2020-08-29 DIAGNOSIS — C787 Secondary malignant neoplasm of liver and intrahepatic bile duct: Secondary | ICD-10-CM | POA: Diagnosis not present

## 2020-08-29 DIAGNOSIS — R079 Chest pain, unspecified: Secondary | ICD-10-CM | POA: Diagnosis not present

## 2020-08-29 DIAGNOSIS — F32A Depression, unspecified: Secondary | ICD-10-CM | POA: Diagnosis not present

## 2020-08-29 DIAGNOSIS — I10 Essential (primary) hypertension: Secondary | ICD-10-CM | POA: Diagnosis not present

## 2020-08-29 DIAGNOSIS — R0603 Acute respiratory distress: Secondary | ICD-10-CM | POA: Diagnosis not present

## 2020-08-29 DIAGNOSIS — J440 Chronic obstructive pulmonary disease with acute lower respiratory infection: Secondary | ICD-10-CM | POA: Diagnosis not present

## 2020-08-29 DIAGNOSIS — R0902 Hypoxemia: Secondary | ICD-10-CM | POA: Diagnosis not present

## 2020-08-29 LAB — CBC WITH DIFFERENTIAL/PLATELET
Abs Immature Granulocytes: 0.12 10*3/uL — ABNORMAL HIGH (ref 0.00–0.07)
Basophils Absolute: 0 10*3/uL (ref 0.0–0.1)
Basophils Relative: 0 %
Eosinophils Absolute: 0 10*3/uL (ref 0.0–0.5)
Eosinophils Relative: 0 %
HCT: 34.6 % — ABNORMAL LOW (ref 39.0–52.0)
Hemoglobin: 11.1 g/dL — ABNORMAL LOW (ref 13.0–17.0)
Immature Granulocytes: 1 %
Lymphocytes Relative: 3 %
Lymphs Abs: 0.7 10*3/uL (ref 0.7–4.0)
MCH: 26.7 pg (ref 26.0–34.0)
MCHC: 32.1 g/dL (ref 30.0–36.0)
MCV: 83.2 fL (ref 80.0–100.0)
Monocytes Absolute: 0.4 10*3/uL (ref 0.1–1.0)
Monocytes Relative: 2 %
Neutro Abs: 19.1 10*3/uL — ABNORMAL HIGH (ref 1.7–7.7)
Neutrophils Relative %: 94 %
Platelets: 364 10*3/uL (ref 150–400)
RBC: 4.16 MIL/uL — ABNORMAL LOW (ref 4.22–5.81)
RDW: 17.2 % — ABNORMAL HIGH (ref 11.5–15.5)
WBC: 20.3 10*3/uL — ABNORMAL HIGH (ref 4.0–10.5)
nRBC: 0 % (ref 0.0–0.2)

## 2020-08-29 LAB — GLUCOSE, CAPILLARY
Glucose-Capillary: 164 mg/dL — ABNORMAL HIGH (ref 70–99)
Glucose-Capillary: 203 mg/dL — ABNORMAL HIGH (ref 70–99)
Glucose-Capillary: 143 mg/dL — ABNORMAL HIGH (ref 70–99)
Glucose-Capillary: 99 mg/dL (ref 70–99)
Glucose-Capillary: 128 mg/dL — ABNORMAL HIGH (ref 70–99)

## 2020-08-29 LAB — BLOOD GAS, ARTERIAL
Acid-Base Excess: 0 mmol/L (ref 0.0–2.0)
Bicarbonate: 21.7 mmol/L (ref 20.0–28.0)
Drawn by: 22052
Expiratory PAP: 12
FIO2: 70
Mode: POSITIVE
O2 Saturation: 90.9 %
Patient temperature: 98.7
Pressure support: 6 cmH2O
pCO2 arterial: 28.8 mmHg — ABNORMAL LOW (ref 32.0–48.0)
pH, Arterial: 7.491 — ABNORMAL HIGH (ref 7.350–7.450)
pO2, Arterial: 62.6 mmHg — ABNORMAL LOW (ref 83.0–108.0)

## 2020-08-29 LAB — COMPREHENSIVE METABOLIC PANEL
ALT: 56 U/L — ABNORMAL HIGH (ref 0–44)
AST: 49 U/L — ABNORMAL HIGH (ref 15–41)
Albumin: 2.4 g/dL — ABNORMAL LOW (ref 3.5–5.0)
Alkaline Phosphatase: 104 U/L (ref 38–126)
Anion gap: 10 (ref 5–15)
BUN: 23 mg/dL (ref 8–23)
CO2: 26 mmol/L (ref 22–32)
Calcium: 9.2 mg/dL (ref 8.9–10.3)
Chloride: 101 mmol/L (ref 98–111)
Creatinine, Ser: 1.01 mg/dL (ref 0.61–1.24)
GFR, Estimated: >60 mL/min (ref >60)
Glucose, Bld: 160 mg/dL — ABNORMAL HIGH (ref 70–99)
Potassium: 4.4 mmol/L (ref 3.5–5.1)
Sodium: 137 mmol/L (ref 135–145)
Total Bilirubin: 0.4 mg/dL (ref 0.3–1.2)
Total Protein: 6.7 g/dL (ref 6.5–8.1)

## 2020-08-29 LAB — URIC ACID: Uric Acid, Serum: 3.8 mg/dL (ref 3.7–8.6)

## 2020-08-29 LAB — TSH: TSH: 1.156 u[IU]/mL (ref 0.350–4.500)

## 2020-08-29 LAB — CULTURE, BLOOD (ROUTINE X 2)
Culture: NO GROWTH
Culture: NO GROWTH
Special Requests: ADEQUATE
Special Requests: ADEQUATE

## 2020-08-29 LAB — D-DIMER, QUANTITATIVE: D-Dimer, Quant: 7.68 ug/mL-FEU — ABNORMAL HIGH (ref 0.00–0.50)

## 2020-08-29 LAB — TROPONIN I (HIGH SENSITIVITY): Troponin I (High Sensitivity): 11 ng/L (ref <18)

## 2020-08-29 LAB — LACTATE DEHYDROGENASE: LDH: 649 U/L — ABNORMAL HIGH (ref 98–192)

## 2020-08-29 LAB — ECHOCARDIOGRAM LIMITED
Height: 67 in
Weight: 2074.09 oz

## 2020-08-29 LAB — BRAIN NATRIURETIC PEPTIDE: B Natriuretic Peptide: 219.4 pg/mL — ABNORMAL HIGH (ref 0.0–100.0)

## 2020-08-29 LAB — HEPARIN LEVEL (UNFRACTIONATED): Heparin Unfractionated: 0.3 IU/mL (ref 0.30–0.70)

## 2020-08-29 LAB — PHOSPHORUS: Phosphorus: 3.5 mg/dL (ref 2.5–4.6)

## 2020-08-29 LAB — MAGNESIUM: Magnesium: 2.3 mg/dL (ref 1.7–2.4)

## 2020-08-29 MED ORDER — DILTIAZEM HCL-DEXTROSE 125-5 MG/125ML-% IV SOLN (PREMIX)
5.0000 mg/h | INTRAVENOUS | Status: DC
  Administered 2020-08-29: 5 mg/h via INTRAVENOUS
  Filled 2020-08-29: qty 125

## 2020-08-29 MED ORDER — AMIODARONE HCL IN DEXTROSE 360-4.14 MG/200ML-% IV SOLN
30.0000 mg/h | INTRAVENOUS | Status: DC
  Administered 2020-08-29 – 2020-09-02 (×7): 30 mg/h via INTRAVENOUS
  Filled 2020-08-29 (×8): qty 200

## 2020-08-29 MED ORDER — METOPROLOL TARTRATE 5 MG/5ML IV SOLN
2.5000 mg | Freq: Once | INTRAVENOUS | Status: AC
  Administered 2020-08-29: 2.5 mg via INTRAVENOUS
  Filled 2020-08-29: qty 5

## 2020-08-29 MED ORDER — SODIUM CHLORIDE 0.9 % IV BOLUS
500.0000 mL | Freq: Once | INTRAVENOUS | Status: AC
  Administered 2020-08-29: 500 mL via INTRAVENOUS

## 2020-08-29 MED ORDER — NITROGLYCERIN 0.4 MG SL SUBL
SUBLINGUAL_TABLET | SUBLINGUAL | Status: AC
  Filled 2020-08-29: qty 1

## 2020-08-29 MED ORDER — FUROSEMIDE 10 MG/ML IJ SOLN
40.0000 mg | Freq: Once | INTRAMUSCULAR | Status: AC
  Administered 2020-08-29: 40 mg via INTRAVENOUS
  Filled 2020-08-29: qty 4

## 2020-08-29 MED ORDER — AMIODARONE IV BOLUS ONLY 150 MG/100ML
INTRAVENOUS | Status: AC
  Administered 2020-08-29: 150 mg via INTRAVENOUS
  Filled 2020-08-29: qty 100

## 2020-08-29 MED ORDER — DILTIAZEM HCL 25 MG/5ML IV SOLN
10.0000 mg | Freq: Once | INTRAVENOUS | Status: AC
  Administered 2020-08-29: 10 mg via INTRAVENOUS
  Filled 2020-08-29: qty 5

## 2020-08-29 MED ORDER — NITROGLYCERIN 0.4 MG SL SUBL: 0.4000 mg | SUBLINGUAL_TABLET | SUBLINGUAL | Status: DC | PRN

## 2020-08-29 MED ORDER — HEPARIN (PORCINE) 25000 UT/250ML-% IV SOLN
1000.0000 [IU]/h | INTRAVENOUS | Status: DC
  Administered 2020-08-29: 900 [IU]/h via INTRAVENOUS
  Filled 2020-08-29: qty 250

## 2020-08-29 MED ORDER — DILTIAZEM LOAD VIA INFUSION: 10.0000 mg | Freq: Once | INTRAVENOUS | Status: DC

## 2020-08-29 MED ORDER — METOPROLOL TARTRATE 5 MG/5ML IV SOLN
2.5000 mg | Freq: Four times a day (QID) | INTRAVENOUS | Status: DC
  Administered 2020-08-29: 2.5 mg via INTRAVENOUS
  Filled 2020-08-29 (×2): qty 5

## 2020-08-29 MED ORDER — AMIODARONE IV BOLUS ONLY 150 MG/100ML
150.0000 mg | Freq: Once | INTRAVENOUS | Status: AC
  Administered 2020-08-29: 150 mg via INTRAVENOUS

## 2020-08-29 MED ORDER — AMIODARONE HCL IN DEXTROSE 360-4.14 MG/200ML-% IV SOLN
60.0000 mg/h | INTRAVENOUS | Status: AC
  Administered 2020-08-29 (×2): 60 mg/h via INTRAVENOUS
  Filled 2020-08-29 (×2): qty 200

## 2020-08-29 MED ORDER — INSULIN GLARGINE-YFGN 100 UNIT/ML ~~LOC~~ SOLN
10.0000 [IU] | Freq: Every day | SUBCUTANEOUS | Status: DC
  Administered 2020-08-29 – 2020-09-03 (×6): 10 [IU] via SUBCUTANEOUS
  Filled 2020-08-29 (×7): qty 0.1

## 2020-08-29 MED ORDER — IPRATROPIUM BROMIDE 0.02 % IN SOLN
0.5000 mg | Freq: Three times a day (TID) | RESPIRATORY_TRACT | Status: DC
  Administered 2020-08-29 – 2020-09-11 (×39): 0.5 mg via RESPIRATORY_TRACT
  Filled 2020-08-29 (×40): qty 2.5

## 2020-08-29 MED ORDER — DILTIAZEM HCL-DEXTROSE 125-5 MG/125ML-% IV SOLN (PREMIX)
5.0000 mg/h | INTRAVENOUS | Status: DC
Start: 2020-08-29 — End: 2020-08-30
  Administered 2020-08-29 – 2020-08-30 (×4): 15 mg/h via INTRAVENOUS
  Filled 2020-08-29 (×3): qty 125

## 2020-08-29 MED ORDER — LEVALBUTEROL HCL 0.63 MG/3ML IN NEBU
0.6300 mg | INHALATION_SOLUTION | Freq: Three times a day (TID) | RESPIRATORY_TRACT | Status: DC
  Administered 2020-08-29 – 2020-09-11 (×39): 0.63 mg via RESPIRATORY_TRACT
  Filled 2020-08-29 (×40): qty 3

## 2020-08-29 MED ORDER — AMIODARONE IV BOLUS ONLY 150 MG/100ML: 150.0000 mg | Freq: Once | INTRAVENOUS | Status: AC

## 2020-08-29 NOTE — Progress Notes (Signed)
ANTICOAGULATION CONSULT - Brief note  Pharmacy Consult for Heparin Indication: atrial fibrillation  Allergies  Allergen Reactions   Levofloxacin Rash    Other reaction(s): Angioedema   Lisinopril Cough    Other reaction(s): Angioedema   Diclofenac     Other reaction(s): Lip swelling   Dust Mite Extract Nausea And Vomiting   Elemental Sulfur    Etodolac Nausea And Vomiting    Other reaction(s): Facial swelling   No Known Allergies     Other reaction(s): ANGIOEDEMA OF LIPS, Angioedema of tongue, ANGIOEDEMA OF LIPS, Angioedema of tongue   Tolterodine    Citalopram Other (See Comments)    Other reaction(s): Drowsy   Other Rash    Patient Measurements: Height: 5\' 7"  (170.2 cm) Weight: 58.8 kg (129 lb 10.1 oz) IBW/kg (Calculated) : 66.1 Heparin Dosing Weight: TBW   Medications:  Infusions:   amiodarone 60 mg/hr (08/29/20 1200)   amiodarone     ampicillin-sulbactam (UNASYN) IV Stopped (08/29/20 1230)   diltiazem (CARDIZEM) infusion 15 mg/hr (08/29/20 1200)   heparin 900 Units/hr (08/29/20 1200)    Assessment: 2 yoM with new onset AFib.  Pharmacy is consulted to dose Heparin. Heparin level 0.3, therapeutic on heparin 900 units/hr No bleeding or complications reported  Goal of Therapy:  Heparin level 0.3-0.7 units/ml Monitor platelets by anticoagulation protocol: Yes   Plan:  Continue heparin IV infusion at 900 units/hr Confirmatory Heparin level in 8 hours Daily heparin level and CBC   Gretta Arab PharmD, BCPS Clinical Pharmacist [facility] main pharmacy [phone] 08/29/2020 9:16 PM

## 2020-08-29 NOTE — Plan of Care (Signed)
  Problem: Education: Goal: Knowledge of General Education information will improve Description: Including pain rating scale, medication(s)/side effects and non-pharmacologic comfort measures 08/29/2020 0857 by Baker Pierini, RN Outcome: Progressing 08/29/2020 0244 by Baker Pierini, RN Outcome: Progressing   Problem: Coping: Goal: Level of anxiety will decrease 08/29/2020 0857 by Baker Pierini, RN Outcome: Progressing 08/29/2020 0244 by Baker Pierini, RN Outcome: Progressing   Problem: Elimination: Goal: Will not experience complications related to bowel motility Outcome: Progressing Goal: Will not experience complications related to urinary retention 08/29/2020 0857 by Baker Pierini, RN Outcome: Progressing 08/29/2020 0244 by Baker Pierini, RN Outcome: Progressing   Problem: Pain Managment: Goal: General experience of comfort will improve 08/29/2020 0857 by Baker Pierini, RN Outcome: Progressing 08/29/2020 0244 by Baker Pierini, RN Outcome: Progressing   Problem: Safety: Goal: Ability to remain free from injury will improve 08/29/2020 0857 by Baker Pierini, RN Outcome: Progressing 08/29/2020 0244 by Baker Pierini, RN Outcome: Progressing   Problem: Skin Integrity: Goal: Risk for impaired skin integrity will decrease 08/29/2020 0857 by Baker Pierini, RN Outcome: Progressing 08/29/2020 0244 by Baker Pierini, RN Outcome: Progressing

## 2020-08-29 NOTE — Consult Note (Signed)
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 He was actually discharged 7/28 after hospitalization for hypoxia due to PNA which caused sepsis, and planned to follow up at [facility] this week but returned to the ED 7/30 with worse acute symptoms. At time of discharge, he was started on home O2 at [facility].  In ED, CTA chest without PE but did show new patchy consolidative opacities, and interval incr in size of lung mets.   He was admitted to [facility], started on vanc zosyn cefepime. Remained on vanc cefepime until 8/1 when he was changed to azithro flagyl cefepime, and on 8/3 he was changed to unasyn.  8/4 early AM the pt had new onset Afib RVR, associated diaphoresis and SOB. He was noted by Rapid to be hypoxic, have crackles. He received SL nitro x2, morphine. TRH to bedside and gave 533ml IVF bolus and 10mg  dilt IV, then started on dilt gtt and transferred to ICU.  Remains in Afib RVR with worsening respiratory status. He is on BiPAP.   ABG: 7/49/29/62 on 70%  CMP pretty unremarkable for acute process. Mildly elevated AST ALT.  LDH is elevated at 649, trop is 11.  CBC significant for WBC 20    PCCM consulted in this setting   Pertinent  Medical History  COPD OSA  Agent orange exposure HLD GERD BPH Poorly differentiated metastatic disease: pancreatic tumor, numerous lung tumors, lymphangitic spread   Significant Hospital Events: Including procedures, antibiotic start and stop dates in addition to other pertinent events   7/30 admitted to [facility] with SOB. CTA neg for PE. Incr size of lung  masses. PNA vs lymphangitic spread. Started on vanc cefepime zosyn 7/31 vanc cefepime. Oncology consulted  8/1 azithro cefepime flagyl 8/3 unasyn 8/4 new Afib RVR, worse rep distress and hypoxia, on BiPAP, transferred to ICU. PCCM consulted   Interim History / Subjective:  CCM consulted Refractory Afib rvr Worsening resp status   Objective   Blood pressure (!) 126/96, pulse 89, temperature 97.7 F (36.5 C), temperature source Axillary, resp. rate (!) 26, height 5\' 7"  (1.702 m), weight 58.8 kg, SpO2 94 %.    Vent Mode: BIPAP FiO2 (%):  [70 %-100 %] 70 % Set Rate:  [12 bmp] 12 bmp   Intake/Output Summary (Last 24 hours) at 08/29/2020 0938 Last data filed at 08/29/2020 0600 Gross per 24 hour  Intake 1619.45 ml  Output 3000 ml  Net -1380.55 ml   Filed Weights   08/22/2020 1952 08/27/20 1559 08/29/20 0630  Weight: 61.2 kg 58.7 kg 58.8 kg    Examination: General: chronically and acutely ill appearing older adult M, reclined in bed NAD on bipap HENT: NCAT BiPAP with good seal anicteric sclera pink mm Lungs: R sided expiratory wheeze. Symmetrical chest expansion. Increased RR  Cardiovascular: irir-- Afib RVR. S1s2 cap refill brisk 2+ radial pulses  Abdomen: soft thin flat ndnt + bowel sounds  Extremities: no acute joint deformity no cyanosis or clubbing  Neuro: AAOx4 following commands GU: defer  Resolved Hospital Problem list     Assessment & Plan:   Acute on chronic  hypoxic respiratory failure -recently dc on 5LNC  Numerous lung mets, lymphangitic spread of metastatic disease  Hemoptysis, mild  HCAP  COPD Prior agent orange exposure OSA on BiPAP  (Above conditions present on admission) -Acute resp decline seems to coincide with new onset Afib RVR. Afib RVR can itsself cause resp sx, but with underlying malignancy, decreased mobility during last and current hospitalization, certainly also concern for possible PE. (CTA on admission was neg but that was 4d ago, resp sx have  changed significantly). ECHO in July with EF 55-60% grade I dd. Clinically doesn't seem to be decomp HF but this is possible.  -The cancer itself (tumors, lymphangitic spread, adenopathy) are also certainly contributing to his declining resp status.  P -Cont BiPAP, NPO -Goal SpO2 >88% -Don't think he is stable to go for repeat CTA chest right now, nor would an ECHO be of great yield until we can better control rate -I have ordered heparin gtt per pharm (Afiv RVR) which is also helpful in setting of possible PE -add on Ddimer (mindful of interpretation given presence of other conditions)  -Will also add on BNP -BLE dopplers to r/o DVT  -ECHO when able -agree with trial of lasix  -cont abx  -Bds, solumedrol  -chemo plans as below   New onset Afib RVR -ECG during this onset had some lateral ST depression, inferior Twave inversion. Trops assuring at 11 against ischemic process P -ICU monitoring -I have ordered hep gtt per pharm -cardiology consulted -- cont amio, dilt -pending amio rebolus, I am not opposed to a PRN IV metop too to see if we can make more headway with the rate.  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 Marin Olp following   HTN (Present on admission) P -holding home meds now that pt on dilt gtt  Mildly elevated transaminases (Present on admission) -likely in setting of malignancy P -PRN LFTs   Protein calorie malnutrition Failure to thrive  (Present on admission) -in setting of malignancy P -NPO on BiPAP   Goals of Care -daughter nicki apparently has living will -he is open to trial of mechanical ventilation should his  respiratory status decline independent of arrest, but is not amiable to long-term mechanical support and if longer term support required, he would prefer comfort care  -Full code verified with patient   Chronic medical problems present on admission BPH ED COPD OSA  HTN HLD GERD Depression  Metastatic cancer: pancreas, liver, lung, adenopathy, lymphangitic spread  Agent orange exposure Contaminated water exposure ([place]) Hypoxic respiratory failure  Best Practice (right click and "Reselect all SmartList Selections" daily)   Diet/type: NPO DVT prophylaxis: systemic heparin GI prophylaxis: PPI Lines: Central line- port Foley:  N/A Code Status:  full code Last date of multidisciplinary goals of care discussion [pending]  Labs   CBC: Recent Labs  Lab 08/23/2020 1225 08/25/20 0838 08/27/20 0301 08/28/20 0919 08/29/20 0420  WBC 22.4* 20.1* 16.4* 22.0* 20.3*  NEUTROABS 19.6* 17.9* 15.4* 21.0* 19.1*  HGB 11.1* 10.2* 10.0* 11.1* 11.1*  HCT 34.1* 32.3* 31.0* 35.1* 34.6*  MCV 83.0 84.1 82.2 84.0 83.2  PLT 437* 383 317 368 366    Basic Metabolic Panel: Recent Labs  Lab 08/09/2020 1225 08/25/20 0838 08/27/20 0301 08/28/20 0919 08/29/20 0420  NA 130* 134* 130* 138 137  K 4.4 4.3 5.4* 4.0 4.4  CL 95* 99 100 99 101  CO2 27 26 23 26 26   GLUCOSE 124* 171* 204* 173* 160*  BUN 19 15 16 19 23   CREATININE 1.22 1.20 0.99 0.94 1.01  CALCIUM 8.7* 8.6* 8.9 9.5 9.2  MG  --   --   --  2.0 2.3  PHOS  --   --   --   --  3.5   GFR: Estimated Creatinine Clearance: 51.7 mL/min (by C-G formula based on SCr of 1.01 mg/dL). Recent Labs  Lab 08/18/2020 1334 08/18/2020 1545 08/25/20 0838 08/26/20 0142 08/27/20 0301 08/28/20 0919 08/29/20 0420  PROCALCITON  --   --  0.32 0.43 0.16  --   --   WBC  --   --  20.1*  --  16.4* 22.0* 20.3*  LATICACIDVEN 1.1 1.4  --   --   --   --   --     Liver Function Tests: Recent Labs  Lab 08/20/2020 1225 08/25/20 0838 08/29/20 0420  AST 85*  80* 49*  ALT 50* 46* 56*  ALKPHOS 93 89 104  BILITOT 0.6 0.7 0.4  PROT 6.3* 5.9* 6.7  ALBUMIN 2.0* 1.8* 2.4*   No results for input(s): LIPASE, AMYLASE in the last 168 hours. No results for input(s): AMMONIA in the last 168 hours.  ABG    Component Value Date/Time   PHART 7.491 (H) 08/29/2020 0854   PCO2ART 28.8 (L) 08/29/2020 0854   PO2ART 62.6 (L) 08/29/2020 0854   HCO3 21.7 08/29/2020 0854   ACIDBASEDEF 0.4 08/09/2020 1547   O2SAT 90.9 08/29/2020 0854     Coagulation Profile: No results for input(s): INR, PROTIME in the last 168 hours.  Cardiac Enzymes: No results for input(s): CKTOTAL, CKMB, CKMBINDEX, TROPONINI in the last 168 hours.  HbA1C: No results found for: HGBA1C  CBG: Recent Labs  Lab 08/27/20 0835 08/28/20 0731 08/28/20 2317 08/29/20 0419 08/29/20 0751  GLUCAP 171* 198* 200* 164* 203*    Review of Systems:     Review of Systems  Constitutional:  Positive for diaphoresis, malaise/fatigue and weight loss. Negative for fever.  HENT: Negative.    Eyes: Negative.   Respiratory:  Positive for cough, hemoptysis, shortness of breath and wheezing.   Cardiovascular:  Positive for chest pain and palpitations. Negative for orthopnea and PND.  Gastrointestinal: Negative.   Musculoskeletal: Negative.   Skin: Negative.   Neurological: Negative.   Endo/Heme/Allergies: Negative.   Psychiatric/Behavioral:  Positive for depression. Negative for hallucinations, memory loss, substance abuse and suicidal ideas. The patient is nervous/anxious.     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   Surgical History:   Past Surgical History:  Procedure Laterality Date   IR IMAGING GUIDED PORT  INSERTION  08/21/2020   LAMINECTOMY AND MICRODISCECTOMY LUMBAR SPINE  2007     Social History:   reports that he has quit smoking. He has never used smokeless tobacco. He reports previous alcohol use. He reports previous drug use.   Family History:  His family history includes Pulmonary embolism in his father; Tuberculosis in his mother.   Allergies Allergies  Allergen Reactions  Levofloxacin Rash    Other reaction(s): Angioedema   Lisinopril Cough    Other reaction(s): Angioedema   Diclofenac     Other reaction(s): Lip swelling   Dust Mite Extract Nausea And Vomiting   Elemental Sulfur    Etodolac Nausea And Vomiting    Other reaction(s): Facial swelling   No Known Allergies     Other reaction(s): ANGIOEDEMA OF LIPS, Angioedema of tongue, ANGIOEDEMA OF LIPS, Angioedema of tongue   Tolterodine    Citalopram Other (See Comments)    Other reaction(s): Drowsy   Other Rash     Home Medications  Prior to Admission medications   Medication Sig Start Date End Date Taking? Authorizing Provider  acetaminophen (TYLENOL) 325 MG tablet Take 2 tablets (650 mg total) by mouth every 6 (six) hours as needed for moderate pain. 08/22/20  Yes Arrien, Jimmy Picket, MD  albuterol (VENTOLIN HFA) 108 (90 Base) MCG/ACT inhaler Inhale into the lungs every 6 (six) hours as needed for wheezing or shortness of breath.   Yes [provider]  amLODipine (NORVASC) 10 MG tablet Take 10 mg by mouth daily.   Yes [provider]  benzonatate (TESSALON) 100 MG capsule Take 100 mg by mouth 3 (three) times daily as needed for cough.   Yes [provider]  buPROPion (WELLBUTRIN XL) 300 MG 24 hr tablet Take 300 mg by mouth daily.   Yes [provider]  carboxymethylcellulose 1 % ophthalmic solution Apply 1 drop to eye 2 (two) times daily as needed (dry eyes).   Yes [provider]  chlorpheniramine (CHLOR-TRIMETON) 4 MG tablet Take 4 mg by mouth every 6 (six) hours as  needed for allergies.   Yes [provider]  Cholecalciferol (VITAMIN D) 50 MCG (2000 UT) tablet Take 2,000 Units by mouth daily.   Yes [provider]  cyclobenzaprine (FLEXERIL) 10 MG tablet Take 10 mg by mouth 3 (three) times daily as needed for muscle spasms.   Yes [provider]  diclofenac Sodium (VOLTAREN) 1 % GEL Apply 4 g topically 4 (four) times daily as needed (pain).   Yes [provider]  docusate sodium (COLACE) 100 MG capsule Take 100 mg by mouth daily as needed for mild constipation.   Yes [provider]  dronabinol (MARINOL) 2.5 MG capsule Take 1 capsule (2.5 mg total) by mouth 2 (two) times daily before lunch and supper. 08/22/20  Yes Arrien, Jimmy Picket, MD  Ensure Plus (ENSURE PLUS) LIQD Take 237 mLs by mouth 2 (two) times daily between meals.   Yes [provider]  fluticasone (FLONASE) 50 MCG/ACT nasal spray Place 1 spray into both nostrils daily as needed for allergies or rhinitis.   Yes [provider]  gabapentin (NEURONTIN) 300 MG capsule Take 300 mg by mouth 3 (three) times daily.   Yes [provider]  guaiFENesin-dextromethorphan (ROBITUSSIN DM) 100-10 MG/5ML syrup Take 5 mLs by mouth every 6 (six) hours as needed for cough (chest congestion). 08/22/20  Yes Arrien, Jimmy Picket, MD  hydrOXYzine (ATARAX/VISTARIL) 10 MG tablet Take 10 mg by mouth 3 (three) times daily as needed for anxiety (sleep).   Yes [provider]  ipratropium-albuterol (DUONEB) 0.5-2.5 (3) MG/3ML SOLN Take 3 mLs by nebulization every 6 (six) hours as needed (shortness of breath or wheezing). 08/22/20  Yes Arrien, Jimmy Picket, MD  loratadine (CLARITIN) 10 MG tablet Take 10 mg by mouth daily.   Yes [provider]  methocarbamol (ROBAXIN) 500 MG tablet Take  1 tablet (500 mg total) by mouth every 6 (six) hours as needed for muscle spasms. 07/05/20  Yes Kirsteins, Luanna Salk, MD  omeprazole (PRILOSEC) 40 MG  capsule Take 40 mg by mouth 2 (two) times daily.   Yes [provider]  ondansetron (ZOFRAN-ODT) 4 MG disintegrating tablet Take 4 mg by mouth every 8 (eight) hours as needed for nausea or vomiting.   Yes [provider]  oxyCODONE (OXY IR/ROXICODONE) 5 MG immediate release tablet Take 5 mg by mouth every 6 (six) hours as needed for severe pain or moderate pain.   Yes [provider]  polyethylene glycol (MIRALAX / GLYCOLAX) 17 g packet Take 17 g by mouth daily. 12/27/19  Yes Khatri, Hina, PA-C  pravastatin (PRAVACHOL) 80 MG tablet Take 80 mg by mouth daily.   Yes [provider]  sildenafil (VIAGRA) 100 MG tablet Take 100 mg by mouth daily as needed for erectile dysfunction.   Yes [provider]  sodium chloride (BRONCHO SALINE) inhaler solution Take 1 spray by nebulization as needed (shortness of breath).   Yes [provider]  tiZANidine (ZANAFLEX) 4 MG tablet Take 4 mg by mouth every 8 (eight) hours as needed for muscle spasms.   Yes [provider]     Critical care time: 48 minutes     CRITICAL CARE Performed by: Cristal Generous   Total critical care time: 48 minutes  Critical care time was exclusive of separately billable procedures and treating other patients. Critical care was necessary to treat or prevent imminent or life-threatening deterioration.  Critical care was time spent personally by me on the following activities: development of treatment plan with patient and/or surrogate as well as nursing, discussions with consultants, evaluation of patient's response to treatment, examination of patient, obtaining history from patient or surrogate, ordering and performing treatments and interventions, ordering and review of laboratory studies, ordering and review of radiographic studies, pulse oximetry and re-evaluation of patient's condition.  Eliseo Gum MSN, AGACNP-BC [place] for  pager  08/29/2020, 11:16 AM

## 2020-08-29 NOTE — Progress Notes (Signed)
Lower extremity venous bilateral study completed.   Please see CV Proc for preliminary results.   Reesha Debes, RDMS, RVT  

## 2020-08-29 NOTE — Progress Notes (Signed)
   08/29/20 0420  Assess: MEWS Score  ECG Heart Rate (!) 172  Resp 20  O2 Device HFNC  O2 Flow Rate (L/min) 14 L/min  Assess: MEWS Score  MEWS Temp 0  MEWS Systolic 0  MEWS Pulse 3  MEWS RR 0  MEWS LOC 0  MEWS Score 3  MEWS Score Color Yellow  Assess: if the MEWS score is Yellow or Red  Were vital signs taken at a resting state? Yes  Focused Assessment Change from prior assessment (see assessment flowsheet)  Does the patient meet 2 or more of the SIRS criteria? Yes  Does the patient have a confirmed or suspected source of infection? Yes  Provider and Rapid Response Notified? Yes  Early Detection of Sepsis Score *See Row Information* Low  MEWS guidelines implemented *See Row Information* Yes  Treat  MEWS Interventions Escalated (See documentation below);Administered prn meds/treatments  Pain Scale 0-10  Pain Score 9  Pain Type Acute pain  Pain Location Shoulder  Pain Orientation Left (pt also had chest pain)  Pain Descriptors / Indicators Aching;Discomfort;Radiating  Pain Frequency Other (Comment)  Pain Onset Progressive  Patients Stated Pain Goal 6  Pain Intervention(s) Medication (See eMAR)  Complains of Other (Comment);Shortness of breath  Interventions Other (comment) (Called rapid response and on-call provider)  Take Vital Signs  Increase Vital Sign Frequency  Yellow: Q 2hr X 2 then Q 4hr X 2, if remains yellow, continue Q 4hrs  Escalate  MEWS: Escalate Yellow: discuss with charge nurse/RN and consider discussing with provider and RRT  Notify: Charge Nurse/RN  Name of Charge Nurse/RN Notified [place], RN  Date Charge Nurse/RN Notified 08/29/20  Time Charge Nurse/RN Notified 0420  Notify: Provider  Provider Name/Title Gershon Cull, NP  Date Provider Notified 08/29/20  Time Provider Notified [phone]  Notification Type Page  Notification Reason Change in status;Other (Comment)  Provider response At bedside;See new orders  Date of Provider Response 08/29/20  Time of  Provider Response 0420  Notify: Rapid Response  Name of Rapid Response RN Notified Mandy, RN ICU  Date Rapid Response Notified 08/29/20  Time Rapid Response Notified [id number]  Document  Patient Outcome Transferred/level of care increased  Assess: SIRS CRITERIA  SIRS Temperature  0  SIRS Pulse 1  SIRS Respirations  0  SIRS WBC 1  SIRS Score Sum  2

## 2020-08-29 NOTE — Progress Notes (Signed)
Unfortunately, Mr. Grant Taylor is now down in the ICU.  He went into rapid A. fib this morning.  Rapid response saw him.  His heart rate was close to 200.  He did have a chest x-ray which really showed no change.  He had bilateral infiltrates.  He is on amiodarone drip.  He received diltiazem.  His oxygen saturations are in the high 80s.  His blood pressure has been holding steady.  It is hard to say whether this rapid A. fib as a result of his underlying malignancy.  I know he does have the mediastinal adenopathy that could potentially irritate the pericardial sac.  I do not think that he is stable at this point for chemotherapy today.  Hopefully, when the heart rate becomes more stable, then we can try for treatment.  Thankfully, the protocol that we use is not cardiotoxic in any way.  His labs show white cell count 20.3.  Hemoglobin 11.1.  Platelet count 364,000.  LDH is quite high at 649.  His BUN is 23 creatinine 1.01.  Calcium 9.2.  Potassium 4.4.  Magnesium 2.3.  Albumin is 2.4.  Again, where in a tough situation.  He has this malignancy which is certainly behaving like small cell carcinoma.  The primary site of origin I really do not think is that much of an issue.  I spoke to the charge nurse up on 6 E.  I alerted her to the change in status of Mr. Grant Taylor and that he is down to the ICU.  We will try to get treatment started on him tomorrow.  Hopefully, he will stabilize.  I suspect that it would not be a shock if he were intubated at some point.  I think that we can get treatment into him, we can see a fairly quick response given that this is a small cell/neuroendocrine carcinoma.  I know that he will get outstanding care from the incredibly hard-working staff not in the ICU.  Lattie Haw, MD  Darlyn Chamber 29:11

## 2020-08-29 NOTE — TOC Progression Note (Addendum)
Transition of Care [facility]) - Progression Note    Patient Details  Name: Grant Taylor MRN: [id number] Date of Birth: 1945-07-21  Transition of Care [facility]) CM/SW Contact  Thadeus Gandolfi, Marjie Skiff, RN Phone Number: 08/29/2020, 3:18 PM  Clinical Narrative:    Delana Meyer from the [place] called TOC to inform us that the [place] does not authorize PTAR transport. That would need to be billed through his Medicare. Also, home health services have been set up with [place]. This was placed on AVS. Daughter Elmyra Ricks called to inform as well.    Expected Discharge Plan: [place] Barriers to Discharge: Continued Medical Work up  Expected Discharge Plan and Services Expected Discharge Plan: [place]   Discharge Planning Services: CM Consult Post Acute Care Choice: Durable Medical Equipment, Home Health Living arrangements for the past 2 months: Single Family Home                      Readmission Risk Interventions No flowsheet data found.

## 2020-08-29 NOTE — Significant Event (Signed)
Rapid Response Event Note   Reason for Call :  New onset a-fib RVR, shortness of breath, diaphoretic.  Initial Focused Assessment:  No known cardiac history. Currently in hospital for pneumonia management and chemotherapy. Patient alert, oriented, and able to follow commands. Patient reports waking up short of breath with pain in shoulders that progressed to chest with 10/10 severity. Patient urinating well with approximately 1-2 L urine output. Slight soft BP, but appropriate MAP. Lung sounds with crackles bilaterally. Patient significantly diaphoretic.    Interventions:  EKG CXR CBG (within expected range) Get scheduled labs-IV team drew from portacath. PRN morphine 2mg   and two separate nitroglycerin SL 0.4 mg tabs given without relief of pain. Provider came to bedside. NS 500 mL bolus, Cardizem 10mg  IV push ordered and given. Repeated EKG without significant decrease in HR. Started Cardizem drip at 5mg  and titrated as ordered, but remains in afib RVR.    Plan of Care:  Transfer to ICU as unable to achieve rate control with Cardizem titration. Anticipate starting Amiodarone after arriving to 1234. [address] RN to give report to ICU RN.   Event Summary:   MD Notified: [place] Call Time:0414 Arrival Time: 3151 End Time: [place], RN

## 2020-08-29 NOTE — Progress Notes (Signed)
Notified rapid response and on call provider about patient's heart rate going into the 180's, as well as converting into A-Fib. Patient was also experience some shortness of breath, sweating, chest pain, and left shoulder pain. 12-lead EKG was completed, blood sugar was checked, PRN meds were given, meds ordered by rapid response/on call provider were given, as well as scheduled meds were given prior to moving patient down to ICU. Report was given to ICU nurse Jacqulyn Bath, RN.

## 2020-08-29 NOTE — Progress Notes (Signed)
Echocardiogram 2D Echocardiogram has been performed.  Oneal Deputy Gavriella Hearst 08/29/2020, 12:10 PM

## 2020-08-29 NOTE — Consult Note (Addendum)
Cardiology Consultation:   Patient ID: Grant Taylor MRN: [id number]; DOB: [birth year]  Admit date: 07/28/2020 Date of Consult: 08/29/2020  PCP:  Grant Mellow, MD   [facility] Providers Cardiologist:  None   {  Patient Profile:   Grant Taylor is a 75 y.o. male with a history of COPD, obstructive sleep apnea, hypertension, hyperlipidemia, GERD, prior tobacco use (quit 5-6 years ago), and recently diagnosed metastatic cancer who was admitted on 07/27/2020 for acute hypoxic respiratory failure secondary to pneumonia. Cardiology consulted on 08/29/2020 for evaluation of  is being seen 08/29/2020 for the evaluation of new onset atrial fibrillation with RVR at the request of Dr. Grandville Silos.  History of Present Illness:   Grant Taylor is a 75 year old male with the above history. No known cardiac history. Patient was recently admitted from 08/09/2020 to 08/22/2020 with severe sepsis and acute hypoxic respiratory failure secondary to community acquired pneumonia and pulmonary metastatic disease from recently diagnosed pancreatic cancer (diagnosed on 07/26/2020 at the [facility] in [place]). CT showed multiple pulmonary nodule and masses compatible with metastasis and large mass involving the pancreatic tail up to 10cm. Biopsy was positive for metastatic pancreatic cancer and Port a Cath was placed. Patient was suspected to have noncardiogenic pulmonary edema and COPD exacerbation as well. He was treated with bronchodilators and antibiotics.  Patient presented back to the ED on 08/22/2020 with worsening shortness of breath. CT scan showed interval development of patchy consolidative opacities throughout the right lung as well as interval increase in size of bulky mediastinal and hilar adenopathy. Negative for PE. Patient was started on Cefepime and admitted. 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 Given aggressive  nature of cancer, the plan is to start treatment this admission (initially planned to start today). Rapid Response was called early this morning with worsening shortness of breath and diaphoresis and was found to be in atrial fibrillation with rates as high as the 180s. Chest x-ray was stable. High-sensitivity troponin was negative. He was given a 500 mL bolus of nasal saline ands started on Cardizem. He was transferred to the ICU and then started on IV Amiodarone as well. Cardiology consulted for further evaluation.  At the time of this evaluation, patient on BiPAP. Rates currently in the 150s. It sounds like he has really started to notice a decline over the past 6-12 months. He has had progressive shortness of breath as well as a 35-40 lb weight loss in 6 months, fatigue, and night sweats. He reports chest pain last night when he went into atrial fibrillation with RVR but the more I talk with him the more he describes this as a feeling like his heart is pounding. No other chest pain. He reports lightheadedness/dizziness when he is exerting himself but no syncope. He does describe difficulty breathing at night (PND and orthopnea) but states this has been going on for a long time (years) and is not new. He uses a BiPAP machine at night. No lower extremity edema. He has notes a productive cough and notes hemoptysis. No other abnormal bleeding - no hematuria, hematochezia, or melena. No other recent illnesses other than pneumonia.  Spoke with RN - Patient received Amiodarone bolus when he arrived in the ICU but was not initially started on drip. Therefore, he was just recently rebolused and started on drip and heart rates are as low as RN as seen them today. Cardizem was also  stopped when Amiodarone was started.  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 Authorizing Provider  acetaminophen (TYLENOL) 325 MG tablet Take 2 tablets (650 mg total) by mouth every 6 (six) hours as needed for moderate pain. 08/22/20  Yes Arrien, Jimmy Picket, MD  albuterol (VENTOLIN HFA) 108 (90 Base) MCG/ACT inhaler Inhale into the lungs every 6 (six) hours as needed for wheezing or shortness of breath.   Yes [provider]  amLODipine (NORVASC) 10 MG tablet Take 10 mg by mouth daily.   Yes [provider]  benzonatate (TESSALON) 100 MG capsule Take 100 mg by mouth 3 (three) times daily as needed for cough.   Yes [provider]  buPROPion (WELLBUTRIN XL) 300 MG 24 hr tablet Take 300 mg by mouth daily.   Yes [provider]  carboxymethylcellulose 1 % ophthalmic solution Apply 1 drop to eye 2 (two) times daily as needed (dry eyes).   Yes [provider]  chlorpheniramine (CHLOR-TRIMETON) 4 MG tablet Take 4 mg by mouth every 6 (six) hours as needed for allergies.   Yes [provider]  Cholecalciferol (VITAMIN D) 50 MCG (2000 UT) tablet Take 2,000 Units by mouth daily.   Yes [provider]  cyclobenzaprine (FLEXERIL) 10 MG tablet Take 10 mg by mouth 3 (three) times daily as needed for muscle spasms.   Yes [provider]  diclofenac Sodium (VOLTAREN) 1 % GEL Apply 4 g topically 4 (four) times daily as needed (pain).   Yes [provider]  docusate sodium (COLACE) 100 MG capsule Take 100 mg by mouth daily as needed for mild constipation.   Yes [provider]  dronabinol (MARINOL) 2.5 MG capsule  Take 1 capsule (2.5 mg total) by mouth 2 (two) times daily before lunch and supper. 08/22/20  Yes Arrien, Jimmy Picket, MD  Ensure Plus (ENSURE PLUS) LIQD Take 237 mLs by mouth 2 (two) times daily between meals.   Yes [provider]  fluticasone (FLONASE) 50 MCG/ACT nasal spray Place 1 spray into both nostrils daily as needed for allergies or rhinitis.   Yes [provider]  gabapentin (NEURONTIN) 300 MG capsule Take 300 mg by mouth 3 (three) times daily.   Yes [provider]  guaiFENesin-dextromethorphan (ROBITUSSIN DM) 100-10 MG/5ML syrup Take 5 mLs by mouth every 6 (six) hours as needed for cough (chest congestion). 08/22/20  Yes Arrien, Jimmy Picket, MD  hydrOXYzine (ATARAX/VISTARIL) 10 MG tablet Take 10 mg by mouth 3 (three) times daily as needed for anxiety (sleep).   Yes [provider]  ipratropium-albuterol (DUONEB) 0.5-2.5 (3) MG/3ML SOLN Take 3 mLs by nebulization every 6 (six) hours as needed (shortness of breath or wheezing). 08/22/20  Yes Arrien, Jimmy Picket, MD  loratadine (CLARITIN) 10 MG tablet Take 10 mg by mouth daily.  Yes [provider]  methocarbamol (ROBAXIN) 500 MG tablet Take 1 tablet (500 mg total) by mouth every 6 (six) hours as needed for muscle spasms. 07/05/20  Yes Kirsteins, Luanna Salk, MD  omeprazole (PRILOSEC) 40 MG capsule Take 40 mg by mouth 2 (two) times daily.   Yes [provider]  ondansetron (ZOFRAN-ODT) 4 MG disintegrating tablet Take 4 mg by mouth every 8 (eight) hours as needed for nausea or vomiting.   Yes [provider]  oxyCODONE (OXY IR/ROXICODONE) 5 MG immediate release tablet Take 5 mg by mouth every 6 (six) hours as needed for severe pain or moderate pain.   Yes [provider]  polyethylene glycol (MIRALAX / GLYCOLAX) 17 g packet Take 17 g by mouth daily. 12/27/19  Yes Khatri, Hina, PA-C  pravastatin (PRAVACHOL) 80 MG tablet Take 80 mg by mouth daily.   Yes [provider]  sildenafil (VIAGRA) 100 MG tablet Take 100 mg by mouth daily as needed for erectile dysfunction.   Yes [provider]  sodium chloride (BRONCHO SALINE) inhaler solution Take 1 spray by nebulization as needed (shortness of breath).   Yes [provider]  tiZANidine (ZANAFLEX) 4 MG tablet Take 4 mg by mouth every 8 (eight) hours as needed for muscle spasms.   Yes [provider]    Inpatient Medications: Scheduled Meds:  budesonide (PULMICORT) nebulizer solution  0.5 mg Nebulization BID   buPROPion  300 mg Oral Daily   Chlorhexidine Gluconate Cloth  6 each Topical Daily   dronabinol  2.5 mg Oral BID AC   feeding supplement  237 mL Oral TID BM   gabapentin  300 mg Oral TID   guaiFENesin  1,200 mg Oral BID   insulin aspart  0-15 Units Subcutaneous TID WC   insulin glargine-yfgn  10 Units Subcutaneous Daily   ipratropium  0.5 mg Nebulization TID   levalbuterol  0.63 mg Nebulization TID   loratadine  10 mg Oral Daily   methylPREDNISolone (SOLU-MEDROL) injection  80 mg Intravenous Q6H   multivitamin with minerals  1 tablet Oral Daily   pantoprazole  40 mg Oral Daily   polyethylene glycol  17 g Oral Daily   sodium chloride flush  10-40 mL Intracatheter Q12H   sodium chloride flush  3 mL Intravenous Q12H   Continuous Infusions:  amiodarone 60 mg/hr (08/29/20 0930)   amiodarone     ampicillin-sulbactam (UNASYN) IV 200 mL/hr at 08/29/20 0524   heparin     PRN Meds: acetaminophen, albuterol, benzonatate, docusate sodium, fluticasone, guaiFENesin-codeine, hydrOXYzine, morphine injection, nitroGLYCERIN, ondansetron, oxyCODONE, polyvinyl alcohol, sodium chloride, sodium chloride flush, tiZANidine  Allergies:    Allergies  Allergen Reactions   Levofloxacin Rash    Other reaction(s): Angioedema   Lisinopril Cough    Other reaction(s): Angioedema   Diclofenac     Other reaction(s): Lip swelling   Dust Mite Extract Nausea And Vomiting   Elemental  Sulfur    Etodolac Nausea And Vomiting    Other reaction(s): Facial swelling   No Known Allergies     Other reaction(s): ANGIOEDEMA OF LIPS, Angioedema of tongue, ANGIOEDEMA OF LIPS, Angioedema of tongue   Tolterodine    Citalopram Other (See Comments)    Other reaction(s): Drowsy   Other Rash    Social History:   Social History   Socioeconomic History   Marital status: Married    Spouse name: Not on file   Number of children: Not on file   Years of  education: Not on file   Highest education level: Not on file  Occupational History   Not on file  Tobacco Use   Smoking status: Former   Smokeless tobacco: Never  Vaping Use   Vaping Use: Former  Substance and Sexual Activity   Alcohol use: Not Currently   Drug use: Not Currently   Sexual activity: Yes  Other Topics Concern   Not on file  Social History Narrative   Not on file   Social Determinants of Health   Financial Resource Strain: Not on file  Food Insecurity: Not on file  Transportation Needs: Not on file  Physical Activity: Not on file  Stress: Not on file  Social Connections: Not on file  Intimate Partner Violence: Not on file    Family History:   Family History  Problem Relation Age of Onset   Tuberculosis Mother    Pulmonary embolism Father      ROS:  Please see the history of present illness.  Review of Systems  Constitutional:  Positive for malaise/fatigue and weight loss.  HENT:  Positive for congestion.   Respiratory:  Positive for cough, hemoptysis and shortness of breath.   Cardiovascular:  Positive for chest pain, palpitations, orthopnea and PND. Negative for leg swelling.  Gastrointestinal:  Negative for blood in stool and melena.  Genitourinary:  Negative for hematuria.  Musculoskeletal:  Positive for back pain. Negative for myalgias.  Neurological:  Positive for dizziness. Negative for loss of consciousness.  Endo/Heme/Allergies:  Does not bruise/bleed easily.  Psychiatric/Behavioral:   Substance abuse: prior tobacco use.    All other ROS reviewed and negative.     Physical Exam/Data:   Vitals:   08/29/20 0700 08/29/20 0738 08/29/20 0800 08/29/20 0900  BP: 115/87   (!) 126/96  Pulse: (!) 58  (!) 122 89  Resp: 19  19 (!) 26  Temp: (!) 97.4 F (36.3 C)  97.7 F (36.5 C)   TempSrc: Oral  Axillary   SpO2: (!) 89% 94% 93% 94%  Weight:      Height:        Intake/Output Summary (Last 24 hours) at 08/29/2020 1046 Last data filed at 08/29/2020 0930 Gross per 24 hour  Intake 1965.04 ml  Output 3000 ml  Net -1034.96 ml   Last 3 Weights 08/29/2020 08/27/2020 08/25/2020  Weight (lbs) 129 lb 10.1 oz 129 lb 4.8 oz 135 lb  Weight (kg) 58.8 kg 58.65 kg 61.236 kg     Body mass index is 20.3 kg/m.  General: 75 y.o. male. On BiPAP. HEENT: Normocephalic and atraumatic. Sclera clear.  Neck: Supple. No JVD.  Heart: Tachycardicw with irregular irregular rhythm. No significant murmurs, gallops, or rubs.  Lungs: Tachypneic on BiPAP. Clear to ausculation bilaterally. Course breaths sounds diffusely with crackles in bilateral bases (worse on right). Abdomen: Soft, non-distended, and non-tender to palpation.  Extremities: No lower extremity edema.    Skin: Lower extremities slightly cool to the touch. Upper extremities warm and dry. Neuro: Alert and oriented x3. No focal deficits. Psych: Normal affect. Responds appropriately.   EKG:  The EKG was personally reviewed and demonstrates: Atrial fibrillation with RVR (rates in the 170s to 180s) with LVH and mild ST depression in lateral leads and some T wave inversions in inferior leads (possibly due to repolarization changes). Telemetry:  Telemetry was personally reviewed and demonstrates:  Atrial fibrillation with rates currently in the 140s to 160s but as high as the 180s earlier this morning.  Relevant CV Studies:  Echocardiogram 08/09/2020: Impressions: 1. Left ventricular ejection fraction, by estimation, is 55 to 60%. The  left  ventricle has normal function. The left ventricle has no regional  wall motion abnormalities. Left ventricular diastolic parameters are  consistent with Grade I diastolic  dysfunction (impaired relaxation).   2. Right ventricular systolic function is normal. The right ventricular  size is normal. There is normal pulmonary artery systolic pressure.   3. The mitral valve is normal in structure. Trivial mitral valve  regurgitation. No evidence of mitral stenosis.   4. The aortic valve is tricuspid. There is mild calcification of the  aortic valve. There is mild thickening of the aortic valve. Aortic valve  regurgitation is not visualized. Mild to moderate aortic valve  sclerosis/calcification is present, without any  evidence of aortic stenosis.   5. The inferior vena cava is normal in size with greater than 50%  respiratory variability, suggesting right atrial pressure of 3 mmHg.   Comparison(s): No prior Echocardiogram.   Laboratory Data:  High Sensitivity Troponin:   Recent Labs  Lab 08/09/20 0636 08/09/20 1002 08/11/2020 1225 08/04/2020 1545 08/29/20 0819  TROPONINIHS 10 10 16  18* 11     Chemistry Recent Labs  Lab 08/27/20 0301 08/28/20 0919 08/29/20 0420  NA 130* 138 137  K 5.4* 4.0 4.4  CL 100 99 101  CO2 23 26 26   GLUCOSE 204* 173* 160*  BUN 16 19 23   CREATININE 0.99 0.94 1.01  CALCIUM 8.9 9.5 9.2  GFRNONAA >60 >60 >60  ANIONGAP 7 13 10     Recent Labs  Lab 08/15/2020 1225 08/25/20 0838 08/29/20 0420  PROT 6.3* 5.9* 6.7  ALBUMIN 2.0* 1.8* 2.4*  AST 85* 80* 49*  ALT 50* 46* 56*  ALKPHOS 93 89 104  BILITOT 0.6 0.7 0.4   Hematology Recent Labs  Lab 08/27/20 0301 08/28/20 0919 08/29/20 0420  WBC 16.4* 22.0* 20.3*  RBC 3.77* 4.18* 4.16*  HGB 10.0* 11.1* 11.1*  HCT 31.0* 35.1* 34.6*  MCV 82.2 84.0 83.2  MCH 26.5 26.6 26.7  MCHC 32.3 31.6 32.1  RDW 16.4* 16.8* 17.2*  PLT 317 368 364   BNP Recent Labs  Lab 08/10/2020 1225 08/29/20 0430  BNP 59.1  219.4*    DDimer  Recent Labs  Lab 08/29/20 0400  DDIMER 7.68*     Radiology/Studies:  DG Chest 2 View  Result Date: 08/28/2020 CLINICAL DATA:  Hypoxemic respiratory failure with sepsis and pneumonia. EXAM: CHEST - 2 VIEW COMPARISON:  08/17/2020 FINDINGS: Nodular density is seen throughout both lungs. There is bibasilar airspace disease right greater than left. Small left effusion. Port-A-Cath tip in the SVC. IMPRESSION: No significant interval change. Bilateral lung nodules and bilateral airspace disease right greater than left. Electronically Signed   By: Franchot Gallo M.D.   On: 08/28/2020 13:35   DG Chest Port 1 View  Result Date: 08/29/2020 CLINICAL DATA:  Increasing shortness of breath EXAM: PORTABLE CHEST 1 VIEW COMPARISON:  Yesterday FINDINGS: Patchy right more than left pulmonary infiltrates with multiple nodules by chest CT. Normal heart size and stable mediastinal contours. Right port with tip at the SVC. No visible effusion or pneumothorax. IMPRESSION: Stable pattern of pulmonary infiltrates and nodules. Electronically Signed   By: Monte Fantasia M.D.   On: 08/29/2020 05:13     Assessment and Plan:   New Onset Atrial Fibrillation with RVR - Patient presented with acute hypoxic respiratory failure secondary to pneumonia and metastatic pancreatic cancer with mets to the lungs. He  went into rapid atrial fibrillation early this morning with rates as high as the 180s. Rates improved with IV Amiodarone but still elevated in the 150s. - Potassium 4.4. Keep >4.0. - Magnesium 2.3. Keep >2.0. - TSH normal.  - Echo during recent admission in 07/2020 showed LVEF of 55-60% with grade 1 diastolic dysfunction. - Will restart IV Cardizem (this was apparently stopped when IV Amiodarone was started).  - Continue IV Amiodarone. He just received another bolus.  - Suspect rates will also improve as underlying pulmonary condition improves.  - CHA2DS2-VASc = 3 (HTN and age x2). Will start IV  Heparin (discussed with Dr. Marin Olp and this is OK with him). If he tolerates IV Heparin well, will plan to switch to Eliquis.  Acute Hypoxic Respiratory Failure  - Felt to be multifactorial secondary to pneumonia in the setting of worsening metastatic cancer and underlying COPD. Respiratory status declined this morning and patient now on BiPAP. - LV function normal on recent Echo last month. - BNP mildly elevated at 219. - Chest x-ray this morning was stable from one on admission and showed patchy pulmonary infiltrates (right > left) with multiple nodules. No overt edema. - Think respiratory status is more due to underlying pneumonia and metastatic disease but agree with trying a dose of IV Lasix 40mg  daily to see if that helps. Otherwise, per primary team.  Chest Pain - Patient complained of chest pain during episodes of atrial fibrillation with RVR. Still notes some discomfort now but he describes it has heart "pounding." No other chest pain prior to this.  - EKG shows mild ST depression in lateral leads and some T wave inversion in inferior leads when rates were in the 170s to 180s. - High-sensitivity troponin 18 on admission and repeat this morning 11 which is reassuring. - Repeat limited Echo has been ordered. - Sounds like this is secondary to atrial fibrillation with RVR. No ischemic work-up necessary at this time. If patient still having chest pain after control of heart rates, can rediscuss.  Hypertension - BP mildly elevated at time. - Continue IV Cardizem as above. - Agree with stopping home Amlodipine to allow for more rate control.  Otherwise, per primary team: - Pneumonia - Metastatic pancreatic cancer with mets to the lung/small cell carcinoma: Oncology following - COPD - Severe rotein-calorie malnutrition   Risk Assessment/Risk Scores:    CHA2DS2-VASc Score = 3  This indicates a 3.2% annual risk of stroke. The patient's score is based upon: CHF History: No HTN  History: Yes Diabetes History: No Stroke History: No Vascular Disease History: No Age Score: 2 Gender Score: 0   For questions or updates, please contact [place] Please consult [email or web address] for contact info under    Signed, Darreld Mclean, PA-C  08/29/2020 10:46 AM   Patient seen and examined with Sande Rives, PA-C.  Agree as above, with the following exceptions and changes as noted below.  Mr. 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  Cardiology consulted for atrial fibrillation with rapid ventricular response in the setting of acute respiratory failure.  Patient was started on bolus of amiodarone and we were contacted early this morning and recommended IV diltiazem.  Concern was raised for impending respiratory failure requiring intubation, however on my review this afternoon patient is continuing on BiPAP  and appears overall comfortable without signs of respiratory distress.  Heart rates in atrial fibrillation remain elevated and he is not previously anticoagulated therefore IV heparin was started.  He is in good spirits, with no chest pain or shortness of breath at this time.  Gen: NAD, CV: Irregular, tachycardic, no murmurs, 4/4 bilateral radial and DP pulses, extremities are warm lungs: Faint crackles, Abd: soft, Extrem: Warm, well perfused, no edema, Neuro/Psych: alert and oriented x 3, normal mood and affect. All available labs, radiology testing, previous records reviewed.  He appears warm and dry on exam and limited echocardiogram shows grossly preserved LV function.  His most recent echocardiogram was just 2 weeks ago.  Independent review of the limited images show LVEF of approximately 60%, estimated RA pressure of 5 mmHg with a small and collapsible IVC, preserved RV  function with mild RV dilation.  I have also independently reviewed the images from his echocardiogram 08/09/2020, findings are overall similar.  On presentation to the hospital he was scanned for pulmonary embolism which was negative.  Pulmonary medicine is managing his care primarily and notes possible associated pneumonia and sepsis, this is likely the driver of atrial fibrillation.  I have discussed with Eliseo Gum, NP plans for rate control.  Recommend continuing IV diltiazem at this time, and as necessary can bolus IV amiodarone, however unclear duration of atrial fibrillation, as this is a new diagnosis for him.  He has been started on IV heparin, I agree and we have discussed this with Dr. Marin Olp.  Metoprolol IV can be given in bolus doses if he responds well to this, when he is off of BiPAP, would initiate oral metoprolol for rate control.  Cardiology will follow.  Elouise Munroe, MD 08/29/20 3:36 PM

## 2020-08-29 NOTE — Progress Notes (Signed)
PROGRESS NOTE    Grant Taylor  YYQ:[id number] DOB: 12/19/1945 DOA: 07/29/2020 PCP: [name], MD   Chief Complaint  Patient presents with   Shortness of Breath    Brief Narrative:  75 year old with past medical history significant for hypertension, COPD, HLD, GERD, BPH, OSA who presents with severe cough, inability to eat, abdominal pain, previous diarrhea.  He was recently discharged with plans to follow-up at the [facility] however, he was not doing well at home with the severe cough.  His oxygen requirement increased at home.  He reports nausea. 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   Patient presented with worsening hypoxic respiratory failure, shortness of breath.  CT angio showed interval development of patchy consolidative opacities throughout the right lung which may represent superimposed infectious process or potentially lymphangitic spread of tumor.  Negative for PE.  Bulky mediastinal adenopathy narrows the right lower lobe pulmonary artery.  Interval increase in size of bulky mediastinal and hilar adenopathy as well as multiple bilateral pulmonary metastases.  Redemonstrated partially visualized large pancreatic mass. -Patient placed on IV antibiotics, oncology consulted patient was to start chemo however patient went to A. fib with RVR on 08/29/2020 transferred to the ICU placed on Cardizem drip and subsequently transition to amiodarone drip.  PCCM, cardiology consulted.     Assessment & Plan:   Principal Problem:   Acute on chronic respiratory failure with hypoxia (HCC) Active Problems:   New onset a-fib (HCC)   Essential hypertension   COPD (chronic obstructive pulmonary disease) (HCC)   Pancreatic mass   Pneumonia   Protein-calorie  malnutrition, severe   Small cell carcinoma (HCC)   Leukocytosis   Depression  #1 acute hypoxic respiratory failure likely multifactorial secondary to pneumonia in the setting of worsening metastatic cancer/lymphangitic spread of cancer/COPD -Patient with increasing O2 requirements up to 14 L high flow nasal cannula and nonrebreather early on this morning currently on BiPAP.   -Patient also noted to have gone into A. fib with RVR overnight which likely also contributing to worsening respiratory distress -Urine strep pneumococcus antigen negative.  Urine Legionella antigen negative.  MRSA PCR negative. -Sputum with rare Enterococcus faecalis, no staph isolated. -Procalcitonin noted to be borderline. -ABG obtained this morning with a pH of 7.491/PCO2 of 29/PO2 of 63/bicarb of 22. -IV cefepime, IV azithromycin, IV Flagyl, IV vancomycin have been discontinued.  -Continue IV Unasyn . -Continue scheduled nebs, Mucinex, Claritin, PPI, IV Solu-Medrol, Pulmicort nebs.  -Patient seen in consultation and being followed by oncology Dr. Marin Olp who discussed with patient's oncologist from [facility] and decision made to start inpatient chemotherapy which was to be started today however due to current A. fib with RVR chemotherapy on hold. -We will give a dose of Lasix 40 mg IV x1. -Patient currently in the ICU. -Consult with PCCM for further evaluation and management. -Oncology following and appreciate input and recommendations.  2.  New onset A. fib with RVR -Patient noted to go into A. fib with RVR early this morning with heart rates close to the 200s. -Likely secondary to acute pulmonary issues as in problem #1. -Cycle cardiac enzymes. -Patient with recent 2D echo (08/09/2020) with a EF of 55 to 88%,QBVQ, grade 1 diastolic dysfunction, normal right ventricular systolic function, normal pulmonary artery systolic pressure, normal mitral valve structure, no mitral stenosis, trivial  mitral valve regurgitation,  tricuspid aortic valve, mild to moderate aortic valve sclerosis/calcification is present without any evidence of aortic stenosis. -Patient was on Cardizem 15 mics with heart rate still ranging in the 170s. -Patient received amiodarone bolus x1. -Start amiodarone drip. -Discontinue Cardizem drip. -We will give another bolus of amiodarone x1. -Consult with cardiology for further evaluation and management.  3.  Hypertension -BP initially noted to be soft and as such amlodipine initially held and was resumed on 08/28/2020.   -Patient however in A. fib with RVR was on Cardizem drip and now on amiodarone drip and as such we will discontinue amlodipine.   4.  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 -Patient being followed by oncology was to be started on chemotherapy today however due to current A. fib with RVR chemotherapy has been postponed for now until heart rate is better controlled.   -Per oncology.    5.  Depression -Stable.   -Continue Wellbutrin, hydroxyzine for anxiety.    6.  Moderate protein calorie malnutrition in the setting of malignancy -Continue dronabinol -Continue nutritional supplementation with Ensure.  7.  Hyperkalemia -Status post 1 dose IV Lasix. -Resolved. -Potassium at 4.4 this morning.  8.  Leukocytosis -Patient on IV Solu-Medrol. -Leukocytosis with no significant change from the past 24 hours currently at 20.3 from 22.0 from 16.4 on admission. -Continue IV antibiotics for problem #1. -Place on SSI  9.  Hyperglycemia -Secondary to IV steroids. -Lantus 10 units daily. -Continue SSI.   DVT prophylaxis: Lovenox Code Status: Full Family Communication: Updated patient, ex-wife and daughter at bedside. Disposition:   Status is: Inpatient  Remains inpatient  appropriate because:Inpatient level of care appropriate due to severity of illness  Dispo: The patient is from: Home              Anticipated d/c is to: TBD              Patient currently is not medically stable to d/c.   Difficult to place patient No       Consultants:  Oncology: Dr. Marin Olp PCCM pending Cardiology pending  Procedures:  CT angiogram chest 08/15/2020 Chest x-ray 08/14/2020, 08/28/2020, 08/29/2020  Antimicrobials:  IV Unasyn 08/28/2020>>>>> IV azithromycin 08/26/2020>>>>> 08/28/2020 IV cefepime 08/17/2020>>>>> 08/28/2020 IV Flagyl 08/26/2020>>>> 08/28/2020 IV vancomycin 07/27/2020>>>> 08/26/2020   Subjective: Events overnight noted.  She went into A. fib with heart rates in the 200s.  Currently in the ICU on BiPAP.  Was on Cardizem drip maxed out with heart rate still uncontrolled and started on amiodarone drip with a bolus.  Heart rate still in the 170s.  States does not feel too well today.  Complaints of shortness of breath.  No chest pain.  Was on 14 L high flow nasal cannula and nonrebreather prior to being transitioned to the BiPAP.  Sats of 93%  Objective: Vitals:   08/29/20 0630 08/29/20 0700 08/29/20 0738 08/29/20 0800  BP: 106/79 115/87    Pulse: 89 (!) 58    Resp: (!) 27 19    Temp:  (!) 97.4 F (36.3 C)  97.7 F (36.5 C)  TempSrc:  Oral  Axillary  SpO2: (!) 84% (!) 89% 94%   Weight: 58.8 kg     Height: 5\' 7"  (1.702 m)       Intake/Output Summary (Last 24 hours) at 08/29/2020 7353 Last data filed  at 08/29/2020 0600 Gross per 24 hour  Intake 1619.45 ml  Output 3000 ml  Net -1380.55 ml    Filed Weights   08/09/2020 1952 08/27/20 1559 08/29/20 0630  Weight: 61.2 kg 58.7 kg 58.8 kg    Examination:  General exam: On BiPAP. Respiratory system: Coarse breath sounds diffusely anterior lung fields.  No wheezing.  On BiPAP. Cardiovascular system: Irregularly irregular.  No JVD.  No murmurs rubs or gallops.  No lower extremity edema.   Gastrointestinal system:  Abdomen is soft, nontender, nondistended, positive bowel sounds.  No rebound.  No guarding. Central nervous system: Alert and oriented. No focal neurological deficits. Extremities: Symmetric 5 x 5 power. Skin: No rashes, lesions or ulcers Psychiatry: Judgement and insight appear normal. Mood & affect appropriate.     Data Reviewed: I have personally reviewed following labs and imaging studies  CBC: Recent Labs  Lab 07/29/2020 1225 08/25/20 0838 08/27/20 0301 08/28/20 0919 08/29/20 0420  WBC 22.4* 20.1* 16.4* 22.0* 20.3*  NEUTROABS 19.6* 17.9* 15.4* 21.0* 19.1*  HGB 11.1* 10.2* 10.0* 11.1* 11.1*  HCT 34.1* 32.3* 31.0* 35.1* 34.6*  MCV 83.0 84.1 82.2 84.0 83.2  PLT 437* 383 317 368 364     Basic Metabolic Panel: Recent Labs  Lab 07/26/2020 1225 08/25/20 0838 08/27/20 0301 08/28/20 0919 08/29/20 0420  NA 130* 134* 130* 138 137  K 4.4 4.3 5.4* 4.0 4.4  CL 95* 99 100 99 101  CO2 27 26 23 26 26   GLUCOSE 124* 171* 204* 173* 160*  BUN 19 15 16 19 23   CREATININE 1.22 1.20 0.99 0.94 1.01  CALCIUM 8.7* 8.6* 8.9 9.5 9.2  MG  --   --   --  2.0 2.3  PHOS  --   --   --   --  3.5     GFR: Estimated Creatinine Clearance: 51.7 mL/min (by C-G formula based on SCr of 1.01 mg/dL).  Liver Function Tests: Recent Labs  Lab 08/16/2020 1225 08/25/20 0838 08/29/20 0420  AST 85* 80* 49*  ALT 50* 46* 56*  ALKPHOS 93 89 104  BILITOT 0.6 0.7 0.4  PROT 6.3* 5.9* 6.7  ALBUMIN 2.0* 1.8* 2.4*     CBG: Recent Labs  Lab 08/27/20 0835 08/28/20 0731 08/28/20 2317 08/29/20 0419 08/29/20 0751  GLUCAP 171* 198* 200* 164* 203*      Recent Results (from the past 240 hour(s))  Resp Panel by RT-PCR (Flu A&B, Covid) Nasopharyngeal Swab     Status: None   Collection Time: 07/30/2020  1:34 PM   Specimen: Nasopharyngeal Swab; Nasopharyngeal(NP) swabs in vial transport medium  Result Value Ref Range Status   SARS Coronavirus 2 by RT PCR NEGATIVE NEGATIVE Final    Comment: (NOTE) SARS-CoV-2  target nucleic acids are NOT DETECTED.  The SARS-CoV-2 RNA is generally detectable in upper respiratory specimens during the acute phase of infection. The lowest concentration of SARS-CoV-2 viral copies this assay can detect is 138 copies/mL. A negative result does not preclude SARS-Cov-2 infection and should not be used as the sole basis for treatment or other patient management decisions. A negative result may occur with  improper specimen collection/handling, submission of specimen other than nasopharyngeal swab, presence of viral mutation(s) within the areas targeted by this assay, and inadequate number of viral copies(<138 copies/mL). A negative result must be combined with clinical observations, patient history, and epidemiological information. The expected result is Negative.  Fact Sheet for Patients:  EntrepreneurPulse.com.au  Fact Sheet for Healthcare Providers:  IncredibleEmployment.be  This test is no t yet approved or cleared by the [place] and  has been authorized for detection and/or diagnosis of SARS-CoV-2 by FDA under an Emergency Use Authorization (EUA). This EUA will remain  in effect (meaning this test can be used) for the duration of the COVID-19 declaration under Section 564(b)(1) of the Act, 21 U.S.C.section 360bbb-3(b)(1), unless the authorization is terminated  or revoked sooner.       Influenza A by PCR NEGATIVE NEGATIVE Final   Influenza B by PCR NEGATIVE NEGATIVE Final    Comment: (NOTE) The Xpert Xpress SARS-CoV-2/FLU/RSV plus assay is intended as an aid in the diagnosis of influenza from Nasopharyngeal swab specimens and should not be used as a sole basis for treatment. Nasal washings and aspirates are unacceptable for Xpert Xpress SARS-CoV-2/FLU/RSV testing.  Fact Sheet for Patients: EntrepreneurPulse.com.au  Fact Sheet for Healthcare  Providers: IncredibleEmployment.be  This test is not yet approved or cleared by the [place] FDA and has been authorized for detection and/or diagnosis of SARS-CoV-2 by FDA under an Emergency Use Authorization (EUA). This EUA will remain in effect (meaning this test can be used) for the duration of the COVID-19 declaration under Section 564(b)(1) of the Act, 21 U.S.C. section 360bbb-3(b)(1), unless the authorization is terminated or revoked.  Performed at [facility], [place] [address], [place], [place] [address]   Blood Culture (routine x 2)     Status: None   Collection Time: 08/06/2020  1:34 PM   Specimen: BLOOD RIGHT FOREARM  Result Value Ref Range Status   Specimen Description BLOOD RIGHT FOREARM  Final   Special Requests   Final    BOTTLES DRAWN AEROBIC AND ANAEROBIC Blood Culture adequate volume   Culture   Final    NO GROWTH 5 DAYS Performed at [facility], [place] [address] [place], [place] [address]    Report Status 08/29/2020 FINAL  Final  Blood Culture (routine x 2)     Status: None   Collection Time: 08/05/2020  6:10 PM   Specimen: BLOOD  Result Value Ref Range Status   Specimen Description BLOOD PORTA CATH  Final   Special Requests   Final    BOTTLES DRAWN AEROBIC AND ANAEROBIC Blood Culture adequate volume   Culture   Final    NO GROWTH 5 DAYS Performed at [facility], [place] [address] [place], [place] [address]    Report Status 08/29/2020 FINAL  Final  MRSA Next Gen by PCR, Nasal     Status: None   Collection Time: 08/25/20  8:38 AM   Specimen: Nasal Mucosa; Nasal Swab  Result Value Ref Range Status   MRSA by PCR Next Gen NOT DETECTED NOT DETECTED Final    Comment: (NOTE) The GeneXpert MRSA Assay (FDA approved for NASAL specimens only), is one component of a comprehensive MRSA colonization surveillance program. It is not intended to diagnose MRSA infection nor to guide or monitor treatment for MRSA infections. Test  performance is not FDA approved in patients less than 40 years old. Performed at [facility], [place] [address] [place],  [address]   Expectorated Sputum Assessment w Gram Stain, Rflx to Resp Cult     Status: None   Collection Time: 08/25/20 10:08 AM   Specimen: Expectorated Sputum  Result Value Ref Range Status   Specimen Description EXPECTORATED SPUTUM  Final   Special Requests NONE  Final   Sputum evaluation   Final  THIS SPECIMEN IS ACCEPTABLE FOR SPUTUM CULTURE Performed at [facility], [place] [address], [place], [place] [address]    Report Status 08/26/2020 FINAL  Final  Culture, Respiratory w Gram Stain     Status: None   Collection Time: 08/25/20 10:08 AM  Result Value Ref Range Status   Specimen Description EXPECTORATED SPUTUM  Final   Special Requests NONE Reflexed from [id number]  Final   Gram Stain   Final    RARE WBC PRESENT,BOTH PMN AND MONONUCLEAR RARE GRAM POSITIVE COCCI IN PAIRS    Culture   Final    RARE ENTEROCOCCUS FAECALIS NO STAPHYLOCOCCUS AUREUS ISOLATED No Pseudomonas species isolated Performed at [facility], [place] [address], [place],  [address]    Report Status 08/28/2020 FINAL  Final   Organism ID, Bacteria ENTEROCOCCUS FAECALIS  Final      Susceptibility   Enterococcus faecalis - MIC*    AMPICILLIN <=2 SENSITIVE Sensitive     VANCOMYCIN 1 SENSITIVE Sensitive     GENTAMICIN SYNERGY SENSITIVE Sensitive     * RARE ENTEROCOCCUS FAECALIS          Radiology Studies: DG Chest 2 View  Result Date: 08/28/2020 CLINICAL DATA:  Hypoxemic respiratory failure with sepsis and pneumonia. EXAM: CHEST - 2 VIEW COMPARISON:  08/13/2020 FINDINGS: Nodular density is seen throughout both lungs. There is bibasilar airspace disease right greater than left. Small left effusion. Port-A-Cath tip in the SVC. IMPRESSION: No significant interval change. Bilateral lung nodules and bilateral airspace disease right greater than left. Electronically  Signed   By: Franchot Gallo M.D.   On: 08/28/2020 13:35   DG Chest Port 1 View  Result Date: 08/29/2020 CLINICAL DATA:  Increasing shortness of breath EXAM: PORTABLE CHEST 1 VIEW COMPARISON:  Yesterday FINDINGS: Patchy right more than left pulmonary infiltrates with multiple nodules by chest CT. Normal heart size and stable mediastinal contours. Right port with tip at the SVC. No visible effusion or pneumothorax. IMPRESSION: Stable pattern of pulmonary infiltrates and nodules. Electronically Signed   By: Monte Fantasia M.D.   On: 08/29/2020 05:13        Scheduled Meds:  budesonide (PULMICORT) nebulizer solution  0.5 mg Nebulization BID   buPROPion  300 mg Oral Daily   Chlorhexidine Gluconate Cloth  6 each Topical Daily   dronabinol  2.5 mg Oral BID AC   enoxaparin (LOVENOX) injection  40 mg Subcutaneous Daily   feeding supplement  237 mL Oral TID BM   gabapentin  300 mg Oral TID   guaiFENesin  1,200 mg Oral BID   insulin aspart  0-15 Units Subcutaneous TID WC   ipratropium  0.5 mg Nebulization TID   levalbuterol  0.63 mg Nebulization TID   loratadine  10 mg Oral Daily   methylPREDNISolone (SOLU-MEDROL) injection  80 mg Intravenous Q6H   multivitamin with minerals  1 tablet Oral Daily   pantoprazole  40 mg Oral Daily   polyethylene glycol  17 g Oral Daily   sodium chloride flush  10-40 mL Intracatheter Q12H   sodium chloride flush  3 mL Intravenous Q12H   sodium zirconium cyclosilicate  10 g Oral Once   Continuous Infusions:  amiodarone 60 mg/hr (08/29/20 0809)   amiodarone     amiodarone     ampicillin-sulbactam (UNASYN) IV 200 mL/hr at 08/29/20 0524     LOS: 5 days    Time spent: 40 minutes    Irine Seal, MD [facility]  To contact the attending provider between 7A-7P or the covering provider during after hours 7P-7A, please log into the web site [email or web address] and access using universal Oracle password for that web site. If you do not have the  password, please call the hospital operator.  08/29/2020, 9:16 AM

## 2020-08-29 NOTE — Progress Notes (Addendum)
ANTICOAGULATION CONSULT NOTE - Initial Consult  Pharmacy Consult for heparin Indication: atrial fibrillation  Allergies  Allergen Reactions   Levofloxacin Rash    Other reaction(s): Angioedema   Lisinopril Cough    Other reaction(s): Angioedema   Diclofenac     Other reaction(s): Lip swelling   Dust Mite Extract Nausea And Vomiting   Elemental Sulfur    Etodolac Nausea And Vomiting    Other reaction(s): Facial swelling   No Known Allergies     Other reaction(s): ANGIOEDEMA OF LIPS, Angioedema of tongue, ANGIOEDEMA OF LIPS, Angioedema of tongue   Tolterodine    Citalopram Other (See Comments)    Other reaction(s): Drowsy   Other Rash    Patient Measurements: Height: 5\' 7"  (170.2 cm) Weight: 58.8 kg (129 lb 10.1 oz) IBW/kg (Calculated) : 66.1 Heparin Dosing Weight: 58.8 kg  Vital Signs: Temp: 97.7 F (36.5 C) (08/04 0800) Temp Source: Axillary (08/04 0800) BP: 126/96 (08/04 0900) Pulse Rate: 89 (08/04 0900)  Labs: Recent Labs    08/27/20 0301 08/28/20 0919 08/29/20 0420 08/29/20 0819  HGB 10.0* 11.1* 11.1*  --   HCT 31.0* 35.1* 34.6*  --   PLT 317 368 364  --   CREATININE 0.99 0.94 1.01  --   TROPONINIHS  --   --   --  11    Estimated Creatinine Clearance: 51.7 mL/min (by C-G formula based on SCr of 1.01 mg/dL).   Medical History: Past Medical History:  Diagnosis Date   Cancer ([place])    Chronic GERD    Constipation    ED (erectile dysfunction)    History of BPH    Hyperlipemia    Hypertension    Hypokalemia    OAB (overactive bladder)    Obstructive sleep apnea    Positive PPD    Small cell carcinoma (HCC) 08/28/2020   Vitamin B 12 deficiency     Medications:  Scheduled:   budesonide (PULMICORT) nebulizer solution  0.5 mg Nebulization BID   buPROPion  300 mg Oral Daily   Chlorhexidine Gluconate Cloth  6 each Topical Daily   dronabinol  2.5 mg Oral BID AC   enoxaparin (LOVENOX) injection  40 mg Subcutaneous Daily   feeding supplement  237 mL  Oral TID BM   gabapentin  300 mg Oral TID   guaiFENesin  1,200 mg Oral BID   insulin aspart  0-15 Units Subcutaneous TID WC   insulin glargine-yfgn  10 Units Subcutaneous Daily   ipratropium  0.5 mg Nebulization TID   levalbuterol  0.63 mg Nebulization TID   loratadine  10 mg Oral Daily   methylPREDNISolone (SOLU-MEDROL) injection  80 mg Intravenous Q6H   multivitamin with minerals  1 tablet Oral Daily   pantoprazole  40 mg Oral Daily   polyethylene glycol  17 g Oral Daily   sodium chloride flush  10-40 mL Intracatheter Q12H   sodium chloride flush  3 mL Intravenous Q12H    Assessment: 75 year old male found to have pancreatic mass with a lymph node biopsy positive for 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  Patient now with new onset atrial fibrillation with RVR.  No AC prior to admission.  CHADsVASc 3.  Cardiology recommends anticoagulation with heparin drip with eventual plans to transition to Eliquis.  Pharmacy consulted to dose heparin drip.   Notes productive cough with hemoptysis but no other bleeding reported, CBC stable  Goal of Therapy:  Heparin level 0.3-0.7 units/ml Monitor platelets by anticoagulation protocol: Yes  Plan:  Discontinue prophylactic enoxaparin No heparin bolus since recent enoxaparin administration Start heparin drip at 900 units/hr (ok to give now since only prophylactic enoxaparin dosing) F/u 8 hour HL  Daily HL and CBC F/u ability to transition to [place], PharmD 08/29/2020,10:15 AM

## 2020-08-29 NOTE — Progress Notes (Signed)
PT Cancellation Note  Patient Details Name: Grant Taylor MRN: [id number] DOB: 11-24-1945   Cancelled Treatment:    Reason Eval/Treat Not Completed: Medical issues which prohibited therapy, RR called, afib/. Will check back when medically stable.   Claretha Cooper 08/29/2020, 7:33 AM  [place] Pager [phone] Office [phone]

## 2020-08-29 NOTE — Progress Notes (Signed)
    OVERNIGHT PROGRESS REPORT  Notified by RN for Rapid Response in progress. Patient is in a-fib RVR at a rate of 180s RR RN had given Nitro SL x 2 and Morphine IV Patient is on high flow O2 via [place] (titrating down at this time) EKG were completed CXR completed  Labs completed Spoke with house MD and E-Link is aware that patient will be in ICU    Cardizem bolus and Drip are started with rate decreasing to 150-160s  Fluid bolus was given  Amiodarone bolus is to be infused Patient is transferred to ICU   Dr Marin Olp is aware of transfer Patient has remained awake and oriented x 4 at all times  BP has remained stable    Gershon Cull MSNA MSN [place]

## 2020-08-29 NOTE — Plan of Care (Signed)

## 2020-08-30 ENCOUNTER — Inpatient Hospital Stay (HOSPITAL_COMMUNITY): Payer: No Typology Code available for payment source

## 2020-08-30 DIAGNOSIS — R0603 Acute respiratory distress: Secondary | ICD-10-CM | POA: Diagnosis not present

## 2020-08-30 DIAGNOSIS — I4891 Unspecified atrial fibrillation: Secondary | ICD-10-CM | POA: Diagnosis not present

## 2020-08-30 DIAGNOSIS — J189 Pneumonia, unspecified organism: Secondary | ICD-10-CM | POA: Diagnosis not present

## 2020-08-30 DIAGNOSIS — J9621 Acute and chronic respiratory failure with hypoxia: Secondary | ICD-10-CM | POA: Diagnosis not present

## 2020-08-30 DIAGNOSIS — J158 Pneumonia due to other specified bacteria: Secondary | ICD-10-CM | POA: Diagnosis not present

## 2020-08-30 DIAGNOSIS — R0902 Hypoxemia: Secondary | ICD-10-CM | POA: Diagnosis not present

## 2020-08-30 DIAGNOSIS — C787 Secondary malignant neoplasm of liver and intrahepatic bile duct: Secondary | ICD-10-CM | POA: Diagnosis not present

## 2020-08-30 LAB — CBC WITH DIFFERENTIAL/PLATELET
Abs Immature Granulocytes: 0.09 10*3/uL — ABNORMAL HIGH (ref 0.00–0.07)
Basophils Absolute: 0 10*3/uL (ref 0.0–0.1)
Basophils Relative: 0 %
Eosinophils Absolute: 0 10*3/uL (ref 0.0–0.5)
Eosinophils Relative: 0 %
HCT: 35.1 % — ABNORMAL LOW (ref 39.0–52.0)
Hemoglobin: 11 g/dL — ABNORMAL LOW (ref 13.0–17.0)
Immature Granulocytes: 1 %
Lymphocytes Relative: 4 %
Lymphs Abs: 0.8 10*3/uL (ref 0.7–4.0)
MCH: 26.1 pg (ref 26.0–34.0)
MCHC: 31.3 g/dL (ref 30.0–36.0)
MCV: 83.2 fL (ref 80.0–100.0)
Monocytes Absolute: 0.6 10*3/uL (ref 0.1–1.0)
Monocytes Relative: 3 %
Neutro Abs: 16.4 10*3/uL — ABNORMAL HIGH (ref 1.7–7.7)
Neutrophils Relative %: 92 %
Platelets: 339 10*3/uL (ref 150–400)
RBC: 4.22 MIL/uL (ref 4.22–5.81)
RDW: 17.5 % — ABNORMAL HIGH (ref 11.5–15.5)
WBC: 17.8 10*3/uL — ABNORMAL HIGH (ref 4.0–10.5)
nRBC: 0 % (ref 0.0–0.2)

## 2020-08-30 LAB — COMPREHENSIVE METABOLIC PANEL
ALT: 56 U/L — ABNORMAL HIGH (ref 0–44)
AST: 69 U/L — ABNORMAL HIGH (ref 15–41)
Albumin: 2.3 g/dL — ABNORMAL LOW (ref 3.5–5.0)
Alkaline Phosphatase: 97 U/L (ref 38–126)
Anion gap: 15 (ref 5–15)
BUN: 29 mg/dL — ABNORMAL HIGH (ref 8–23)
CO2: 21 mmol/L — ABNORMAL LOW (ref 22–32)
Calcium: 8.8 mg/dL — ABNORMAL LOW (ref 8.9–10.3)
Chloride: 106 mmol/L (ref 98–111)
Creatinine, Ser: 1.08 mg/dL (ref 0.61–1.24)
GFR, Estimated: >60 mL/min (ref >60)
Glucose, Bld: 135 mg/dL — ABNORMAL HIGH (ref 70–99)
Potassium: 3.6 mmol/L (ref 3.5–5.1)
Sodium: 142 mmol/L (ref 135–145)
Total Bilirubin: 0.3 mg/dL (ref 0.3–1.2)
Total Protein: 6.1 g/dL — ABNORMAL LOW (ref 6.5–8.1)

## 2020-08-30 LAB — GLUCOSE, CAPILLARY
Glucose-Capillary: 155 mg/dL — ABNORMAL HIGH (ref 70–99)
Glucose-Capillary: 148 mg/dL — ABNORMAL HIGH (ref 70–99)
Glucose-Capillary: 322 mg/dL — ABNORMAL HIGH (ref 70–99)
Glucose-Capillary: 97 mg/dL (ref 70–99)

## 2020-08-30 LAB — MAGNESIUM: Magnesium: 2.3 mg/dL (ref 1.7–2.4)

## 2020-08-30 LAB — HEPARIN LEVEL (UNFRACTIONATED): Heparin Unfractionated: 0.22 IU/mL — ABNORMAL LOW (ref 0.30–0.70)

## 2020-08-30 MED ORDER — SODIUM CHLORIDE 0.9 % IV SOLN: 10.0000 mg | Freq: Once | INTRAVENOUS | Status: DC

## 2020-08-30 MED ORDER — SODIUM CHLORIDE 0.9 % IV SOLN
96.0000 mg/m2 | Freq: Once | INTRAVENOUS | Status: AC
  Administered 2020-08-31: 160 mg via INTRAVENOUS
  Filled 2020-08-30: qty 8

## 2020-08-30 MED ORDER — PALONOSETRON HCL INJECTION 0.25 MG/5ML
0.2500 mg | Freq: Once | INTRAVENOUS | Status: AC
  Administered 2020-08-31: 0.25 mg via INTRAVENOUS
  Filled 2020-08-30: qty 5

## 2020-08-30 MED ORDER — HEPARIN BOLUS VIA INFUSION
1000.0000 [IU] | Freq: Once | INTRAVENOUS | Status: AC
  Administered 2020-08-30: 1000 [IU] via INTRAVENOUS
  Filled 2020-08-30: qty 1000

## 2020-08-30 MED ORDER — SODIUM CHLORIDE 0.9 % IV SOLN: Freq: Once | INTRAVENOUS | Status: AC

## 2020-08-30 MED ORDER — DILTIAZEM HCL ER COATED BEADS 120 MG PO CP24
120.0000 mg | ORAL_CAPSULE | Freq: Every day | ORAL | Status: DC
  Administered 2020-08-30: 120 mg via ORAL
  Filled 2020-08-30: qty 1

## 2020-08-30 MED ORDER — SODIUM CHLORIDE 0.9 % IV SOLN
150.0000 mg | Freq: Once | INTRAVENOUS | Status: AC
  Administered 2020-08-31: 150 mg via INTRAVENOUS
  Filled 2020-08-30: qty 5

## 2020-08-30 MED ORDER — POTASSIUM CHLORIDE CRYS ER 20 MEQ PO TBCR
40.0000 meq | EXTENDED_RELEASE_TABLET | Freq: Once | ORAL | Status: AC
  Administered 2020-08-30: 40 meq via ORAL
  Filled 2020-08-30: qty 2

## 2020-08-30 MED ORDER — MAGNESIUM SULFATE IN D5W 1-5 GM/100ML-% IV SOLN
1.0000 g | Freq: Once | INTRAVENOUS | Status: AC
  Administered 2020-08-30: 1 g via INTRAVENOUS
  Filled 2020-08-30: qty 100

## 2020-08-30 MED ORDER — ONDANSETRON HCL 4 MG/2ML IJ SOLN
4.0000 mg | Freq: Four times a day (QID) | INTRAMUSCULAR | Status: DC | PRN
  Administered 2020-08-30 – 2020-09-10 (×6): 4 mg via INTRAVENOUS
  Filled 2020-08-30 (×6): qty 2

## 2020-08-30 MED ORDER — ENOXAPARIN SODIUM 40 MG/0.4ML IJ SOSY
40.0000 mg | PREFILLED_SYRINGE | INTRAMUSCULAR | Status: DC
  Administered 2020-08-30 – 2020-09-11 (×13): 40 mg via SUBCUTANEOUS
  Filled 2020-08-30 (×13): qty 0.4

## 2020-08-30 MED ORDER — CHLORHEXIDINE GLUCONATE 0.12 % MT SOLN
15.0000 mL | Freq: Two times a day (BID) | OROMUCOSAL | Status: DC
  Administered 2020-08-30 – 2020-09-10 (×21): 15 mL via OROMUCOSAL
  Filled 2020-08-30 (×19): qty 15

## 2020-08-30 MED ORDER — SODIUM CHLORIDE 0.9 % IV SOLN
293.2000 mg | Freq: Once | INTRAVENOUS | Status: AC
  Administered 2020-08-31: 290 mg via INTRAVENOUS
  Filled 2020-08-30: qty 29

## 2020-08-30 MED ORDER — SODIUM CHLORIDE 0.9 % IV SOLN: Freq: Once | INTRAVENOUS | Status: DC

## 2020-08-30 MED ORDER — SODIUM CHLORIDE 0.9 % IV SOLN
96.0000 mg/m2 | Freq: Once | INTRAVENOUS | Status: AC
  Administered 2020-09-01: 160 mg via INTRAVENOUS
  Filled 2020-08-30: qty 8

## 2020-08-30 MED ORDER — ORAL CARE MOUTH RINSE
15.0000 mL | Freq: Two times a day (BID) | OROMUCOSAL | Status: DC
  Administered 2020-09-01 – 2020-09-10 (×17): 15 mL via OROMUCOSAL

## 2020-08-30 NOTE — Progress Notes (Signed)
Grant Taylor is more stable.  His heart rate is down to 83.  It looks like this might be sinus rhythm.  He is on BiPAP.  His oxygen saturation is 97%.  Blood pressure is good.  I think we really need to start him on treatment with chemotherapy today.  I think he is stable enough to tolerate this.  He is on heparin for the atrial fibrillation.  His labs show white cell count 17.8.  Hemoglobin 11.  Platelet count 339,000.  The BUN is 29 creatinine 1.08.  Calcium 8.8.  His liver function studies are up a little bit.  He is alert.  He is oriented.  Again, I think that he is now ready for chemotherapy.  Is hard to say if the cancer is part of the issue that he had yesterday with the atrial fibrillation.  It would not surprise me if it was.  He did have a chest x-ray this morning.  It does show the bilateral infiltrates.  I really think that we really have to do treatment.  I realize that there is a risk with respect to infection and possible infection getting worse.  However, I do believe that his malignancy is clearly causing issues for him.  He will definitely be on G-CSF after treatment to try to help with his white cells.  Is vital signs show temperature 97.8.  Pulse 82.  Blood pressure 147/90.  Oxygen saturation is 98%.  His lungs actually sound pretty decent.  I do not hear a lot of wheezing.  Cardiac exam regular rate and rhythm.  Abdomen is soft.  Again, we we will have to get treatment started on him.  I just believe that his malignancy is part of the underlying etiology for his decline.  Hopefully, we can get one of our chemotherapy nurses to come down to do treatment.  I know that he is getting outstanding treatment from the hard-working staff in the ICU.  Lattie Haw, MD  [facility] 6:38

## 2020-08-30 NOTE — Progress Notes (Signed)
A sputum cup was placed in patient room for patient to cough up some sputum

## 2020-08-30 NOTE — Progress Notes (Signed)
PT Cancellation Note  Patient Details Name: Grant Taylor MRN: [id number] DOB: 12/26/1945   Cancelled Treatment:    Reason Eval/Treat Not Completed: Patient declined, states that his HR goes up and he gets SOB when he moves. Will check back tomorrow.   Claretha Cooper 08/30/2020, 4:02 PM  [place] Pager [phone] Office [phone]

## 2020-08-30 NOTE — Progress Notes (Signed)
Hooks for heparin Indication: atrial fibrillation  Allergies  Allergen Reactions   Levofloxacin Rash    Other reaction(s): Angioedema   Lisinopril Cough    Other reaction(s): Angioedema   Diclofenac     Other reaction(s): Lip swelling   D[place] Extract Nausea And Vomiting   Elemental Sulfur    Etodolac Nausea And Vomiting    Other reaction(s): Facial swelling   No Known Allergies     Other reaction(s): ANGIOEDEMA OF LIPS, Angioedema of tongue, ANGIOEDEMA OF LIPS, Angioedema of tongue   Tolterodine    Citalopram Other (See Comments)    Other reaction(s): Drowsy   Other Rash    Patient Measurements: Height: 5\' 7"  (170.2 cm) Weight: 58.8 kg (129 lb 10.1 oz) IBW/kg (Calculated) : 66.1 Heparin Dosing Weight: 58.8 kg  Vital Signs: Temp: 98.1 F (36.7 C) (08/05 0000) Temp Source: Axillary (08/05 0000) BP: 146/93 (08/05 0400) Pulse Rate: 83 (08/05 0400)  Labs: Recent Labs    08/28/20 0919 08/29/20 0420 08/29/20 0819 08/29/20 2022 08/30/20 0255  HGB 11.1* 11.1*  --   --  11.0*  HCT 35.1* 34.6*  --   --  35.1*  PLT 368 364  --   --  339  HEPARINUNFRC  --   --   --  0.30 0.22*  CREATININE 0.94 1.01  --   --   --   TROPONINIHS  --   --  11  --   --      Estimated Creatinine Clearance: 51.7 mL/min (by C-G formula based on SCr of 1.01 mg/dL).   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   Vitamin B 12 deficiency     Medications:  Scheduled:   budesonide (PULMICORT) nebulizer solution  0.5 mg Nebulization BID   buPROPion  300 mg Oral Daily   Chlorhexidine Gluconate Cloth  6 each Topical Daily   dronabinol  2.5 mg Oral BID AC   feeding supplement  237  mL Oral TID BM   gabapentin  300 mg Oral TID   guaiFENesin  1,200 mg Oral BID   insulin aspart  0-15 Units Subcutaneous TID WC   insulin glargine-yfgn  10 Units Subcutaneous Daily   ipratropium  0.5 mg Nebulization TID   levalbuterol  0.63 mg Nebulization TID   loratadine  10 mg Oral Daily   methylPREDNISolone (SOLU-MEDROL) injection  80 mg Intravenous Q6H   metoprolol tartrate  2.5 mg Intravenous Q6H   multivitamin with minerals  1 tablet Oral Daily   pantoprazole  40 mg Oral Daily   polyethylene glycol  17 g Oral Daily   sodium chloride flush  10-40 mL Intracatheter Q12H   sodium chloride flush  3 mL Intravenous Q12H    Assessment: 75 year old male found to have pancreatic mass with a lymph node biopsy positive for 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  Patient now with new onset atrial fibrillation with RVR.  No AC prior to admission.  CHADsVASc 3.  Cardiology recommends anticoagulation with heparin drip with eventual plans to transition to Eliquis.  Pharmacy consulted to dose heparin drip.  At baseline, notes productive cough with hemoptysis but no other bleeding reported. CBC stable  08/30/2020: Heparin level 0.22- now subtherapeutic on IV heparin infusion at 900 units/hr CBC: Hg 11- slightly low but stable, pltc WNL RN reports no bleeding or infusion related issues  Goal of Therapy:  Heparin level 0.3-0.7 units/ml Monitor platelets by anticoagulation protocol: Yes   Plan:  Heparin 1000 unit IV bolus x1 then increase heparin infusion to 1000 units/hr  F/u 8 hour HL  Daily HL and CBC F/u ability to transition to [place], PharmD 08/30/2020,4:27 AM

## 2020-08-30 NOTE — Progress Notes (Signed)
Patient taken off BIPAP and placed on 10LHf [place]. He is tolerating well at this time with no distress. Stas=96% hr79  rr14 . RT will continue to monitor

## 2020-08-30 NOTE — Progress Notes (Signed)
Per Dr. Marin Olp, remove dex 10 as premed for chemo since pt is on sch solumedrol

## 2020-08-30 NOTE — Progress Notes (Addendum)
Progress Note  Patient Name: Grant Taylor Date of Encounter: 08/30/2020  [facility] Cardiologist: Elouise Munroe, MD   Subjective   No acute overnight events. Patient doing much better this morning. Her converted back to sinus rhythm last night. He if off BiPAP and on HFNC. He reports shortness of breath has improved. No recurrent chest pain since being back in sinus rhythm.  Sounds like they are going to try to start chemotherapy today.  Inpatient Medications    Scheduled Meds:  budesonide (PULMICORT) nebulizer solution  0.5 mg Nebulization BID   buPROPion  300 mg Oral Daily   chlorhexidine  15 mL Mouth Rinse BID   Chlorhexidine Gluconate Cloth  6 each Topical Daily   dronabinol  2.5 mg Oral BID AC   enoxaparin (LOVENOX) injection  40 mg Subcutaneous Q24H   feeding supplement  237 mL Oral TID BM   gabapentin  300 mg Oral TID   guaiFENesin  1,200 mg Oral BID   insulin aspart  0-15 Units Subcutaneous TID WC   insulin glargine-yfgn  10 Units Subcutaneous Daily   ipratropium  0.5 mg Nebulization TID   levalbuterol  0.63 mg Nebulization TID   loratadine  10 mg Oral Daily   mouth rinse  15 mL Mouth Rinse q12n4p   methylPREDNISolone (SOLU-MEDROL) injection  80 mg Intravenous Q6H   multivitamin with minerals  1 tablet Oral Daily   pantoprazole  40 mg Oral Daily   polyethylene glycol  17 g Oral Daily   sodium chloride flush  10-40 mL Intracatheter Q12H   sodium chloride flush  3 mL Intravenous Q12H   Continuous Infusions:  amiodarone 30 mg/hr (08/30/20 0400)   ampicillin-sulbactam (UNASYN) IV 3 g (08/30/20 0958)   diltiazem (CARDIZEM) infusion 15 mg/hr (08/30/20 0803)   magnesium sulfate bolus IVPB     PRN Meds: acetaminophen, albuterol, benzonatate, docusate sodium, fluticasone, guaiFENesin-codeine, hydrOXYzine, morphine injection, nitroGLYCERIN, ondansetron (ZOFRAN) IV, oxyCODONE, polyvinyl alcohol, sodium chloride, sodium chloride flush, tiZANidine    Vital Signs    Vitals:   08/30/20 0728 08/30/20 0800 08/30/20 0900 08/30/20 1000  BP:  (!) 144/86 (!) 143/85 (!) 147/90  Pulse:    89  Resp:  (!) 24 (!) 29 (!) 27  Temp: (!) 97.5 F (36.4 C)     TempSrc: Axillary     SpO2: 97%   93%  Weight:      Height:        Intake/Output Summary (Last 24 hours) at 08/30/2020 1006 Last data filed at 08/30/2020 0951 Gross per 24 hour  Intake 1218.37 ml  Output 2300 ml  Net -1081.63 ml   Last 3 Weights 08/29/2020 08/27/2020 08/12/2020  Weight (lbs) 129 lb 10.1 oz 129 lb 4.8 oz 135 lb  Weight (kg) 58.8 kg 58.65 kg 61.236 kg      Telemetry   Normal sinus rhythm with rates in the 80s. - Personally Reviewed  ECG    No new ECG tracing today. - Personally Reviewed  Physical Exam   GEN: No acute distress.   Neck: No JVD. Cardiac: RRR. No significant murmurs, rubs, or gallops.  Respiratory: On HFNC. No significant increased work of breathing. Course crackles noted in bilateral bases (worse on right). GI: Soft, non-distended, and non-tender. MS: No lower extremity edema. No deformity. Skin: Warm and dry. Neuro:  No focal deficits. Psych: Normal affect. Responds appropriately.  Labs    High Sensitivity Troponin:   Recent Labs  Lab 08/09/20 0636 08/09/20 1002  07/31/2020 1225 08/15/2020 1545 08/29/20 0819  TROPONINIHS 10 10 16  18* 11      Chemistry Recent Labs  Lab 08/25/20 0838 08/27/20 0301 08/28/20 0919 08/29/20 0420 08/30/20 0255  NA 134*   < > 138 137 142  K 4.3   < > 4.0 4.4 3.6  CL 99   < > 99 101 106  CO2 26   < > 26 26 21*  GLUCOSE 171*   < > 173* 160* 135*  BUN 15   < > 19 23 29*  CREATININE 1.20   < > 0.94 1.01 1.08  CALCIUM 8.6*   < > 9.5 9.2 8.8*  PROT 5.9*  --   --  6.7 6.1*  ALBUMIN 1.8*  --   --  2.4* 2.3*  AST 80*  --   --  49* 69*  ALT 46*  --   --  56* 56*  ALKPHOS 89  --   --  104 97  BILITOT 0.7  --   --  0.4 0.3  GFRNONAA >60   < > >60 >60 >60  ANIONGAP 9   < > 13 10 15    < > = values in this  interval not displayed.     Hematology Recent Labs  Lab 08/28/20 0919 08/29/20 0420 08/30/20 0255  WBC 22.0* 20.3* 17.8*  RBC 4.18* 4.16* 4.22  HGB 11.1* 11.1* 11.0*  HCT 35.1* 34.6* 35.1*  MCV 84.0 83.2 83.2  MCH 26.6 26.7 26.1  MCHC 31.6 32.1 31.3  RDW 16.8* 17.2* 17.5*  PLT 368 364 339    BNP Recent Labs  Lab 08/16/2020 1225 08/29/20 0430  BNP 59.1 219.4*     DDimer  Recent Labs  Lab 08/29/20 0400  DDIMER 7.68*     Radiology    DG Chest 2 View  Result Date: 08/28/2020 CLINICAL DATA:  Hypoxemic respiratory failure with sepsis and pneumonia. EXAM: CHEST - 2 VIEW COMPARISON:  08/25/2020 FINDINGS: Nodular density is seen throughout both lungs. There is bibasilar airspace disease right greater than left. Small left effusion. Port-A-Cath tip in the SVC. IMPRESSION: No significant interval change. Bilateral lung nodules and bilateral airspace disease right greater than left. Electronically Signed   By: Franchot Gallo M.D.   On: 08/28/2020 13:35   DG CHEST PORT 1 VIEW  Result Date: 08/30/2020 CLINICAL DATA:  Hypoxia EXAM: PORTABLE CHEST 1 VIEW COMPARISON:  08/29/2020 FINDINGS: Multiple pulmonary nodules as noted on CT. Bilateral airspace infiltrates right greater than left stable. No pneumothorax or pleural effusion. Heart size and vascularity normal. Port-A-Cath in the SVC. IMPRESSION: Stable chest x-ray. Bilateral lung nodules and bilateral infiltrates right greater than left. Electronically Signed   By: Franchot Gallo M.D.   On: 08/30/2020 08:07   DG Chest Port 1 View  Result Date: 08/29/2020 CLINICAL DATA:  Increasing shortness of breath EXAM: PORTABLE CHEST 1 VIEW COMPARISON:  Yesterday FINDINGS: Patchy right more than left pulmonary infiltrates with multiple nodules by chest CT. Normal heart size and stable mediastinal contours. Right port with tip at the SVC. No visible effusion or pneumothorax. IMPRESSION: Stable pattern of pulmonary infiltrates and nodules.  Electronically Signed   By: Monte Fantasia M.D.   On: 08/29/2020 05:13   VAS [place] LOWER EXTREMITY VENOUS (DVT)  Result Date: 08/29/2020  Lower Venous DVT Study Patient Name:  Grant Taylor  Date of Exam:   08/29/2020 Medical Rec #: [id number]  Accession #:    [id number] Date of Birth: 1945/12/29                 Patient Gender: M Patient Age:   75Y Exam Location:  [facility] Procedure:      VAS [place] LOWER EXTREMITY VENOUS (DVT) Referring Phys: GRACE BOWSER --------------------------------------------------------------------------------  Indications: SOB, history of metastatic cancer.  Comparison Study: No prior studies. Performing Technologist: Darlin Coco RDMS,RVT  Examination Guidelines: A complete evaluation includes B-mode imaging, spectral Doppler, color Doppler, and power Doppler as needed of all accessible portions of each vessel. Bilateral testing is considered an integral part of a complete examination. Limited examinations for reoccurring indications may be performed as noted. The reflux portion of the exam is performed with the patient in reverse Trendelenburg.  +---------+---------------+---------+-----------+----------+--------------+ RIGHT    CompressibilityPhasicitySpontaneityPropertiesThrombus Aging +---------+---------------+---------+-----------+----------+--------------+ CFV      Full           Yes      Yes                                 +---------+---------------+---------+-----------+----------+--------------+ SFJ      Full                                                        +---------+---------------+---------+-----------+----------+--------------+ FV Prox  Full                                                        +---------+---------------+---------+-----------+----------+--------------+ FV Mid   Full                                                         +---------+---------------+---------+-----------+----------+--------------+ FV DistalFull                                                        +---------+---------------+---------+-----------+----------+--------------+ PFV      Full                                                        +---------+---------------+---------+-----------+----------+--------------+ POP      Full           Yes      Yes                                 +---------+---------------+---------+-----------+----------+--------------+ PTV      Full                                                        +---------+---------------+---------+-----------+----------+--------------+  PERO     Full                                                        +---------+---------------+---------+-----------+----------+--------------+   +---------+---------------+---------+-----------+----------+--------------+ LEFT     CompressibilityPhasicitySpontaneityPropertiesThrombus Aging +---------+---------------+---------+-----------+----------+--------------+ CFV      Full           Yes      Yes                                 +---------+---------------+---------+-----------+----------+--------------+ SFJ      Full                                                        +---------+---------------+---------+-----------+----------+--------------+ FV Prox  Full                                                        +---------+---------------+---------+-----------+----------+--------------+ FV Mid   Full                                                        +---------+---------------+---------+-----------+----------+--------------+ FV DistalFull                                                        +---------+---------------+---------+-----------+----------+--------------+ PFV      Full                                                         +---------+---------------+---------+-----------+----------+--------------+ POP      Full           Yes      Yes                                 +---------+---------------+---------+-----------+----------+--------------+ PTV      Full                                                        +---------+---------------+---------+-----------+----------+--------------+ PERO     Full                                                        +---------+---------------+---------+-----------+----------+--------------+  Summary: RIGHT: - There is no evidence of deep vein thrombosis in the lower extremity.  - No cystic structure found in the popliteal fossa.  LEFT: - There is no evidence of deep vein thrombosis in the lower extremity.  - No cystic structure found in the popliteal fossa.  *See table(s) above for measurements and observations. Electronically signed by Servando Snare MD on 08/29/2020 at 3:49:51 PM.    Final    ECHOCARDIOGRAM LIMITED  Result Date: 08/29/2020    ECHOCARDIOGRAM LIMITED REPORT   Patient Name:   Grant Taylor Date of Exam: 08/29/2020 Medical Rec #:  [id number]                Height:       67.0 in Accession #:    [id number]               Weight:       129.6 lb Date of Birth:  01-04-1946                BSA:          1.682 m Patient Age:    64 years                 BP:           121/75 mmHg Patient Gender: M                        HR:           170 bpm. Exam Location:  Inpatient Procedure: Limited Echo, Color Doppler and Cardiac Doppler Indications:    I48.91* Unspecified atrial fibrillation  History:        Patient has prior history of Echocardiogram examinations, most                 recent 08/09/2020. COPD; Risk Factors:Hypertension, Dyslipidemia                 and Sleep Apnea.  Sonographer:    Raquel Sarna Senior RDCS Referring Phys: [id number] Sharion Dove DANIELS  Sonographer Comments: Heart rate has been significantly elevated since admission per RN. Echo performed to rule out  structural cause. IMPRESSIONS  1. Left ventricular ejection fraction, by estimation, is 60 to 65%. The left ventricle has normal function. The left ventricle has no regional wall motion abnormalities. Left ventricular diastolic parameters are indeterminate.  2. Right ventricular systolic function is normal. The right ventricular size is normal. There is normal pulmonary artery systolic pressure. The estimated right ventricular systolic pressure is 67.6 mmHg.  3. The mitral valve is normal in structure. Trivial mitral valve regurgitation. No evidence of mitral stenosis.  4. The aortic valve is tricuspid. Aortic valve regurgitation is not visualized. Mild aortic valve sclerosis is present, with no evidence of aortic valve stenosis.  5. The inferior vena cava is normal in size with greater than 50% respiratory variability, suggesting right atrial pressure of 3 mmHg.  6. The patient was in atrial fibrillation. FINDINGS  Left Ventricle: Left ventricular ejection fraction, by estimation, is 60 to 65%. The left ventricle has normal function. The left ventricle has no regional wall motion abnormalities. The left ventricular internal cavity size was normal in size. There is  no left ventricular hypertrophy. Left ventricular diastolic parameters are indeterminate. Right Ventricle: The right ventricular size is normal. No increase in right ventricular wall thickness. Right ventricular systolic function is normal. There is normal pulmonary artery systolic pressure. The tricuspid  regurgitant velocity is 2.69 m/s, and  with an assumed right atrial pressure of 3 mmHg, the estimated right ventricular systolic pressure is 29.9 mmHg. Left Atrium: Left atrial size was normal in size. Right Atrium: Right atrial size was normal in size. Pericardium: There is no evidence of pericardial effusion. Mitral Valve: The mitral valve is normal in structure. Mild mitral annular calcification. Trivial mitral valve regurgitation. No evidence of  mitral valve stenosis. Tricuspid Valve: The tricuspid valve is normal in structure. Tricuspid valve regurgitation is mild. Aortic Valve: The aortic valve is tricuspid. Aortic valve regurgitation is not visualized. Mild aortic valve sclerosis is present, with no evidence of aortic valve stenosis. Venous: The inferior vena cava is normal in size with greater than 50% respiratory variability, suggesting right atrial pressure of 3 mmHg. IAS/Shunts: No atrial level shunt detected by color flow Doppler. AORTIC VALVE LVOT Vmax:   119.50 cm/s LVOT Vmean:  76.867 cm/s LVOT VTI:    0.165 m TRICUSPID VALVE TR Peak grad:   28.9 mmHg TR Vmax:        269.00 cm/s  SHUNTS Systemic VTI: 0.17 m Loralie Champagne MD Electronically signed by Loralie Champagne MD Signature Date/Time: 08/29/2020/4:59:05 PM    Final     Cardiac Studies   Echocardiogram 08/29/2020: Impressions: 1. Left ventricular ejection fraction, by estimation, is 60 to 65%. The  left ventricle has normal function. The left ventricle has no regional  wall motion abnormalities. Left ventricular diastolic parameters are  indeterminate.   2. Right ventricular systolic function is normal. The right ventricular  size is normal. There is normal pulmonary artery systolic pressure. The  estimated right ventricular systolic pressure is 24.2 mmHg.   3. The mitral valve is normal in structure. Trivial mitral valve  regurgitation. No evidence of mitral stenosis.   4. The aortic valve is tricuspid. Aortic valve regurgitation is not  visualized. Mild aortic valve sclerosis is present, with no evidence of  aortic valve stenosis.   5. The inferior vena cava is normal in size with greater than 50%  respiratory variability, suggesting right atrial pressure of 3 mmHg.   6. The patient was in atrial fibrillation.    Patient Profile     75 y.o. male with a history of COPD, obstructive sleep apnea, hypertension, hyperlipidemia, GERD, prior tobacco use (quit 5-6 years ago), and  recently diagnosed metastatic cancer who was admitted on 08/09/2020 for acute hypoxic respiratory failure secondary to pneumonia and metastatic cancer. Cardiology consulted on 08/29/2020 for evaluation of  is being seen 08/29/2020 for the evaluation of new onset atrial fibrillation with RVR at the request of Dr. Grandville Silos.  Assessment & Plan    New Onset Atrial Fibrillation with RVR - Patient presented with acute hypoxic respiratory failure secondary to pneumonia and metastatic pancreatic cancer with mets to the lungs. He went into rapid atrial fibrillation early this morning with rates as high as the 180s. - Converted back to normal sinus rhythm last night. Will repeat 12 lead EKG for confirmation (given low voltage P wave on telemetry). - Potassium 3.6 today. - Magnesium 2.3 today. - TSH normal. - Echo showed normal LVEF of 60-65%. - Currently on IV Diltiazem drip and IV Amiodarone drip. Will stop IV Diltiazem and switch PO 120mg  daily. If patient has recurrent atrial fibrillation during initation of chemotherapy (they are hoping to start this today), can restart IV Diltiazem. Will continue IV Amiodarone for now to help maintain sinus rhythm as chemotherapy is being initiated as this  may be a irritant. If no recurrent atrial fibrillation, don't think patient will need to be discharged on Amiodarone. - CHA2DS2-VASc = 3 (HTN and age x2). Started on IV Heparin drip yesterday but this was discontinue today and he was switch to VTE prophylaxis dose of Lovenox. Given CHA2DS2-VASc score and hypercoagulability state with cancer, he may benefit from Eliquis at least in the short term if blood counts will allow after starting chemotherapy.   Acute Hypoxic Respiratory Failure - Felt to be multifactorial secondary to pneumonia in the setting of worsening metastatic cancer and underlying COPD. Respiratory status declined yesterday morning requiring BiPAP. Improved today - off BiPAP and on HFNC. - LV function normal  on recent Echo last month. - BNP mildly elevated at 219. - Chest x-ray this morning was stable from one on admission and showed patchy pulmonary infiltrates (right > left) with multiple nodules. No overt edema.  - Received a couple dose of IV Lasix this admission. Las dose yesterday with good urine output. Net negative 4 L this admission. - Suspect respiratory failure due to underlying pneumonia and metastatic disease not CHF. He appears euvolemic on exam. I don't think he needs any additional IV Lasix.  - Management per primary team.  Chest Pain - Patient complained of chest pain during episodes of atrial fibrillation with RVR. Resolved with restoration of sinus rhythm. - EKG shows mild ST depression in lateral leads and some T wave inversion in inferior leads when rates were in the 170s to 180s. - High-sensitivity troponin 18 on admission and repeat this morning 11 which is reassuring. - Echo shows normal LV function and wall motion. - Pain secondary to atrial fibrillation with RVR. No ischemic work-up necessary.   Hypertension - BP mildly elevated - Stopping IV Diltiazem and switching to PO as above. - Agree with stopping home Amlodipine to allow for more rate control.   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  Agree as above, with the following exceptions and changes as noted below.  Mr. Fraticelli converted to sinus rhythm and appears much better from a respiratory standpoint. Gen: NAD, CV: RRR, no murmurs, Lungs: Crackles bilaterally, Abd: soft, Extrem: Warm, well perfused, no edema, Neuro/Psych: alert and oriented x 3,  normal mood and affect. All available labs, radiology testing, previous records reviewed.  If chemotherapy is initiated, he may have recurrent atrial fibrillation in which case would continue IV amiodarone through the short-term to keep arrhythmia at [place].  Okay to transition IV diltiazem to p.o. diltiazem today, can reinitiate IV diltiazem if rate control is needed.  I discussed with Eliseo Gum, NP that the patient has had hemoptysis recently, with that in mind we will hold off on anticoagulation.  He is in sinus rhythm and likely the risk is low.  Nevertheless if he returns to atrial fibrillation, we may want to consider revisiting IV heparin, but this can be considered if A. fib recurs.  Cardiology will follow as needed over the weekend.  Elouise Munroe, MD 08/30/20 12:41 PM

## 2020-08-30 NOTE — Progress Notes (Signed)
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 He was actually discharged 7/28 after hospitalization for hypoxia due to PNA which caused sepsis, and planned to follow up at [facility] this week but returned to the ED 7/30 with worse acute symptoms. At time of discharge, he was started on home O2 at [facility].  In ED, CTA chest without PE but did show new patchy consolidative opacities, and interval incr in size of lung mets.   He was admitted to [facility], started on vanc zosyn cefepime. Remained on vanc cefepime until 8/1 when he was changed to azithro flagyl cefepime, and on 8/3 he was changed to unasyn.  8/4 early AM the pt had new onset Afib RVR, associated diaphoresis and SOB. He was noted by Rapid to be hypoxic, have crackles. He received SL nitro x2, morphine. TRH to bedside and gave 528ml IVF bolus and 10mg  dilt IV, then started on dilt gtt and transferred to ICU.  Remains in Afib RVR with worsening respiratory status. He is on BiPAP.   ABG: 7/49/29/62 on 70%  CMP pretty unremarkable for acute process. Mildly elevated AST ALT.  LDH is elevated at 649, trop is 11.  CBC significant for WBC 20    PCCM consulted in this setting   Pertinent  Medical History  COPD OSA  Agent orange exposure HLD GERD BPH Poorly differentiated metastatic disease: pancreatic tumor, numerous lung tumors, lymphangitic spread   Significant Hospital Events: Including procedures, antibiotic start and stop dates in addition to other pertinent events   7/30 admitted to [facility] with SOB. CTA neg for PE. Incr size of lung  masses. PNA vs lymphangitic spread. Started on vanc cefepime zosyn 7/31 vanc cefepime. Oncology consulted  8/1 azithro cefepime flagyl 8/3 unasyn 8/4 new Afib RVR, worse rep distress and hypoxia, on BiPAP, transferred to ICU. PCCM consulted  8/5 I took him off bipap, trial of HFNC. Hopefully starting chemo. Converted to sinus 8/4 evening after starting low dose metop   Interim History / Subjective:   HR much better with the low dose [facility] metop -- looks like in sinus now   I took him off BiPAP this morning given resp improvements, on HFNC  C/o pretty bad nausea -- changing zofran to IV, talked with his nurse about it  Objective   Blood pressure (!) 144/86, pulse 81, temperature (!) 97.5 F (36.4 C), temperature source Axillary, resp. rate (!) 24, height 5\' 7"  (1.702 m), weight 58.8 kg, SpO2 97 %.    Vent Mode: BIPAP FiO2 (%):  [60 %-65 %] 60 %   Intake/Output Summary (Last 24 hours) at 08/30/2020 0904 Last data filed at 08/30/2020 0400 Gross per 24 hour  Intake 1550.96 ml  Output 2300 ml  Net -749.04 ml   Filed Weights   08/06/2020 1952 08/27/20 1559 08/29/20 0630  Weight: 61.2 kg 58.7 kg 58.8 kg    Examination: General: acutely ill appearing older adult M reclined in bed NAD  HENT: NCAT anicteric sclera pink mmm Lungs: Low pitched R sided expiratory wheeze. Symmetrical chest expansion, even and unlabored on HFNC  Cardiovascular: rrr  s1s2 cap refill brisk  Abdomen: soft thing ndnt + bowel sounds  Extremities: no acute abnormality no cyanosis or clubbing  Neuro: AAOx4 following commands  GU: defer  Psych: calm, appropriate for age and situation. Euthymic mood congruent affect  Resolved Hospital Problem list     Assessment & Plan:   Acute on chronic hypoxic respiratory failure -recent dc [facility] hospital [facility] Pulmonary metastases, lymphangitic spread in setting of metastatic disease Hemoptysis, mild  -in setting of metastatic dz, cough Enterococcus Faecalis HCAP COPD   Hx agent orange exposure OSA on BiPAP  -Acute resp distress seems to coincide with new onset Afib RVR. Resp status has improved with converting to NSR.  -The cancer itself (tumors, lymphangitic spread, adenopathy) are certainly contributing to his resp status -likely HCAP also playing a role -no DVT, ECHO not c/w R sided dysfunction so doubt PE. ECHO also not concerning for acute HF   P -weaned to HFNC -nocturnal BiPAP (which he does at home) -Unasyn  -Bds, solumedrol  -I am going to give 1g mag to see if it helps at all with some of his resp sx (His mag is WNL right now, this is not for hypomagnesemia) -chemo plans as below   New onset Afib RVR, improving  -looks like he has had a lot of improvement with the second dose of IV metop, was also on amio gtt and dilt gtt  P -will dc [place] metop (has not had last 2 doses given the order parameters I set) -anticipate we can de-escalate other therapies as well, cards is following so I will defer to their preference re dc v convert to oral  - no dvt, back in sinus -- will dc the hep gtt and go back to vte ppx  -cont cardiac monitoring   Poorly differentiated metastatic disease-- 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 Marin Olp following, plan for chemo induction 8/5 (sounds like carboplatinum/etopiside)   HTN P -holding home meds -- currently on a dilt gtt  Mildly elevated transaminases  -likely in setting of malignancy P -PRN LFTs   Nausea FTT Protein calorie malnutrition P -IV zofran -off BiPAP 8/5 AM -- will adv diet from NPO to clears and see how he tolerates. Hesitation to immediately adv further this morning -- he just came off BiPAP, c/o severe nausea. Don't want to push his nausea too far until we have more confidence that he can stay off  BiPAP (basically, I just don't want him to go back on BiPAP with nausea + a full belly)  -if nausea improves, resp status stays pretty stable this morning, will adv diet further   Goals of Care  -daughter nicki apparently has living will -he is open to trial of mechanical ventilation should his respiratory status decline independent of arrest, but is not amiable to long-term mechanical support and if longer term support required, he would prefer comfort care  -Full code verified with patient   Chronic medical problems present on admission BPH ED COPD OSA  HTN HLD GERD Depression  Metastatic cancer: pancreas, liver, lung, adenopathy, lymphangitic spread  Agent orange exposure Contaminated water exposure ([place]) Hypoxic respiratory failure   Best Practice (right click and "Reselect all SmartList Selections" daily)   Diet/type: NPO DVT prophylaxis: LMWH GI prophylaxis: PPI Lines: Central line- port Foley:  N/A  Code Status:  full code Last date of multidisciplinary goals of care discussion [pending]  Labs   CBC: Recent Labs  Lab 08/25/20 0838 08/27/20 0301 08/28/20 0919 08/29/20 0420 08/30/20 0255  WBC 20.1* 16.4* 22.0* 20.3* 17.8*  NEUTROABS 17.9* 15.4* 21.0* 19.1* 16.4*  HGB 10.2* 10.0* 11.1* 11.1* 11.0*  HCT 32.3* 31.0* 35.1* 34.6* 35.1*  MCV 84.1 82.2 84.0 83.2 83.2  PLT 383 317 368 364 387    Basic Metabolic Panel: Recent Labs  Lab 08/25/20 0838 08/27/20 0301 08/28/20 0919 08/29/20 0420 08/30/20 0255  NA 134* 130* 138 137 142  K 4.3 5.4* 4.0 4.4 3.6  CL 99 100 99 101 106  CO2 26 23 26 26  21*  GLUCOSE 171* 204* 173* 160* 135*  BUN 15 16 19 23  29*  CREATININE 1.20 0.99 0.94 1.01 1.08  CALCIUM 8.6* 8.9 9.5 9.2 8.8*  MG  --   --  2.0 2.3 2.3  PHOS  --   --   --  3.5  --    GFR: Estimated Creatinine Clearance: 48.4 mL/min (by C-G formula based on SCr of 1.08 mg/dL). Recent Labs  Lab 08/19/2020 1334 08/17/2020 1545 08/25/20 0838 08/26/20 0142  08/27/20 0301 08/28/20 0919 08/29/20 0420 08/30/20 0255  PROCALCITON  --   --  0.32 0.43 0.16  --   --   --   WBC  --   --  20.1*  --  16.4* 22.0* 20.3* 17.8*  LATICACIDVEN 1.1 1.4  --   --   --   --   --   --     Liver Function Tests: Recent Labs  Lab 08/10/2020 1225 08/25/20 0838 08/29/20 0420 08/30/20 0255  AST 85* 80* 49* 69*  ALT 50* 46* 56* 56*  ALKPHOS 93 89 104 97  BILITOT 0.6 0.7 0.4 0.3  PROT 6.3* 5.9* 6.7 6.1*  ALBUMIN 2.0* 1.8* 2.4* 2.3*   No results for input(s): LIPASE, AMYLASE in the last 168 hours. No results for input(s): AMMONIA in the last 168 hours.  ABG    Component Value Date/Time   PHART 7.491 (H) 08/29/2020 0854   PCO2ART 28.8 (L) 08/29/2020 0854   PO2ART 62.6 (L) 08/29/2020 0854   HCO3 21.7 08/29/2020 0854   ACIDBASEDEF 0.4 08/09/2020 1547   O2SAT 90.9 08/29/2020 0854     Coagulation Profile: No results for input(s): INR, PROTIME in the last 168 hours.  Cardiac Enzymes: No results for input(s): CKTOTAL, CKMB, CKMBINDEX, TROPONINI in the last 168 hours.  HbA1C: No results found for: HGBA1C  CBG:  CRITICAL CARE Performed by: Cristal Generous   Total critical care time: 38 minutes  Critical care time was exclusive of separately billable procedures and treating other patients. Critical care was necessary to treat or prevent imminent or life-threatening deterioration.  Critical care was time spent personally by me on the following activities: development of treatment plan with patient and/or surrogate as well as nursing, discussions with consultants, evaluation of patient's response to treatment, examination of patient, obtaining history from patient or surrogate, ordering and performing treatments and interventions, ordering and review of laboratory studies, ordering and review of radiographic studies, pulse oximetry and re-evaluation of patient's condition.  Eliseo Gum MSN, AGACNP-BC [place] for  pager  08/30/2020, 9:04 AM

## 2020-08-31 ENCOUNTER — Inpatient Hospital Stay (HOSPITAL_COMMUNITY): Payer: No Typology Code available for payment source

## 2020-08-31 ENCOUNTER — Encounter (HOSPITAL_COMMUNITY): Payer: Self-pay | Admitting: Hematology & Oncology

## 2020-08-31 DIAGNOSIS — F32A Depression, unspecified: Secondary | ICD-10-CM | POA: Diagnosis not present

## 2020-08-31 DIAGNOSIS — J9621 Acute and chronic respiratory failure with hypoxia: Secondary | ICD-10-CM | POA: Diagnosis not present

## 2020-08-31 DIAGNOSIS — J440 Chronic obstructive pulmonary disease with acute lower respiratory infection: Secondary | ICD-10-CM | POA: Diagnosis not present

## 2020-08-31 DIAGNOSIS — I10 Essential (primary) hypertension: Secondary | ICD-10-CM | POA: Diagnosis not present

## 2020-08-31 LAB — COMPREHENSIVE METABOLIC PANEL
ALT: 70 U/L — ABNORMAL HIGH (ref 0–44)
AST: 99 U/L — ABNORMAL HIGH (ref 15–41)
Albumin: 2.3 g/dL — ABNORMAL LOW (ref 3.5–5.0)
Alkaline Phosphatase: 100 U/L (ref 38–126)
Anion gap: 9 (ref 5–15)
BUN: 28 mg/dL — ABNORMAL HIGH (ref 8–23)
CO2: 28 mmol/L (ref 22–32)
Calcium: 8.8 mg/dL — ABNORMAL LOW (ref 8.9–10.3)
Chloride: 105 mmol/L (ref 98–111)
Creatinine, Ser: 0.92 mg/dL (ref 0.61–1.24)
GFR, Estimated: >60 mL/min (ref >60)
Glucose, Bld: 154 mg/dL — ABNORMAL HIGH (ref 70–99)
Potassium: 4.2 mmol/L (ref 3.5–5.1)
Sodium: 142 mmol/L (ref 135–145)
Total Bilirubin: 0.3 mg/dL (ref 0.3–1.2)
Total Protein: 6.2 g/dL — ABNORMAL LOW (ref 6.5–8.1)

## 2020-08-31 LAB — CBC WITH DIFFERENTIAL/PLATELET
Abs Immature Granulocytes: 0.18 10*3/uL — ABNORMAL HIGH (ref 0.00–0.07)
Basophils Absolute: 0 10*3/uL (ref 0.0–0.1)
Basophils Relative: 0 %
Eosinophils Absolute: 0 10*3/uL (ref 0.0–0.5)
Eosinophils Relative: 0 %
HCT: 36.3 % — ABNORMAL LOW (ref 39.0–52.0)
Hemoglobin: 11.5 g/dL — ABNORMAL LOW (ref 13.0–17.0)
Immature Granulocytes: 1 %
Lymphocytes Relative: 1 %
Lymphs Abs: 0.3 10*3/uL — ABNORMAL LOW (ref 0.7–4.0)
MCH: 26.7 pg (ref 26.0–34.0)
MCHC: 31.7 g/dL (ref 30.0–36.0)
MCV: 84.2 fL (ref 80.0–100.0)
Monocytes Absolute: 0.7 10*3/uL (ref 0.1–1.0)
Monocytes Relative: 3 %
Neutro Abs: 21.2 10*3/uL — ABNORMAL HIGH (ref 1.7–7.7)
Neutrophils Relative %: 95 %
Platelets: 340 10*3/uL (ref 150–400)
RBC: 4.31 MIL/uL (ref 4.22–5.81)
RDW: 17.5 % — ABNORMAL HIGH (ref 11.5–15.5)
WBC: 22.4 10*3/uL — ABNORMAL HIGH (ref 4.0–10.5)
nRBC: 0 % (ref 0.0–0.2)

## 2020-08-31 LAB — GLUCOSE, CAPILLARY
Glucose-Capillary: 152 mg/dL — ABNORMAL HIGH (ref 70–99)
Glucose-Capillary: 120 mg/dL — ABNORMAL HIGH (ref 70–99)
Glucose-Capillary: 135 mg/dL — ABNORMAL HIGH (ref 70–99)
Glucose-Capillary: 107 mg/dL — ABNORMAL HIGH (ref 70–99)

## 2020-08-31 MED ORDER — HYDRALAZINE HCL 20 MG/ML IJ SOLN: 10.0000 mg | Freq: Four times a day (QID) | INTRAMUSCULAR | Status: DC | PRN

## 2020-08-31 MED ORDER — HYDRALAZINE HCL 20 MG/ML IJ SOLN
5.0000 mg | Freq: Four times a day (QID) | INTRAMUSCULAR | Status: DC | PRN
  Administered 2020-09-02 – 2020-09-04 (×2): 5 mg via INTRAVENOUS
  Filled 2020-08-31 (×2): qty 1

## 2020-08-31 MED ORDER — HYDRALAZINE HCL 20 MG/ML IJ SOLN: 10.0000 mg | Freq: Once | INTRAMUSCULAR | Status: DC

## 2020-08-31 MED ORDER — DILTIAZEM HCL ER COATED BEADS 120 MG PO CP24
240.0000 mg | ORAL_CAPSULE | Freq: Every day | ORAL | Status: DC
  Administered 2020-09-01 – 2020-09-10 (×10): 240 mg via ORAL
  Filled 2020-08-31 (×10): qty 2

## 2020-08-31 MED ORDER — HYDRALAZINE HCL 20 MG/ML IJ SOLN
5.0000 mg | Freq: Once | INTRAMUSCULAR | Status: AC
  Administered 2020-08-31: 5 mg via INTRAVENOUS
  Filled 2020-08-31: qty 1

## 2020-08-31 NOTE — Progress Notes (Signed)
PT Cancellation Note  Patient Details Name: Fount Bahe MRN: [id number] DOB: 1945-02-01   Cancelled Treatment:     PT order received but eval deferred at request of RN - pt has been placed back on bipap.  Will follow.   Marquise Wicke 08/31/2020, 9:56 AM

## 2020-08-31 NOTE — Progress Notes (Signed)
Progress Note  Patient Name: Grant Taylor Date of Encounter: 08/31/2020  [facility] Cardiologist: Elouise Munroe, MD   Subjective   Feeling ok this morning, mild bilateral flank pain. No CP or SOB.  Inpatient Medications    Scheduled Meds:  budesonide (PULMICORT) nebulizer solution  0.5 mg Nebulization BID   buPROPion  300 mg Oral Daily   CARBOplatin  290 mg Intravenous Once   chlorhexidine  15 mL Mouth Rinse BID   Chlorhexidine Gluconate Cloth  6 each Topical Daily   diltiazem  120 mg Oral Daily   dronabinol  2.5 mg Oral BID AC   enoxaparin (LOVENOX) injection  40 mg Subcutaneous Q24H   etoposide  96 mg/m2 (Treatment Plan Recorded) Intravenous Once   [START ON 09/01/2020] etoposide  96 mg/m2 (Treatment Plan Recorded) Intravenous Once   feeding supplement  237 mL Oral TID BM   gabapentin  300 mg Oral TID   guaiFENesin  1,200 mg Oral BID   insulin aspart  0-15 Units Subcutaneous TID WC   insulin glargine-yfgn  10 Units Subcutaneous Daily   ipratropium  0.5 mg Nebulization TID   levalbuterol  0.63 mg Nebulization TID   loratadine  10 mg Oral Daily   mouth rinse  15 mL Mouth Rinse q12n4p   methylPREDNISolone (SOLU-MEDROL) injection  80 mg Intravenous Q6H   multivitamin with minerals  1 tablet Oral Daily   palonosetron  0.25 mg Intravenous Once   pantoprazole  40 mg Oral Daily   polyethylene glycol  17 g Oral Daily   sodium chloride flush  10-40 mL Intracatheter Q12H   sodium chloride flush  3 mL Intravenous Q12H   Continuous Infusions:  sodium chloride     [START ON 09/01/2020] sodium chloride     amiodarone 30 mg/hr (08/31/20 0603)   ampicillin-sulbactam (UNASYN) IV Stopped (08/31/20 0441)   fosaprepitant (EMEND) IV infusion 150 mg     PRN Meds: acetaminophen, albuterol, benzonatate, docusate sodium, fluticasone, guaiFENesin-codeine, hydrOXYzine, morphine injection, nitroGLYCERIN, ondansetron (ZOFRAN) IV, oxyCODONE, polyvinyl alcohol, sodium chloride,  sodium chloride flush, tiZANidine   Vital Signs    Vitals:   08/31/20 0700 08/31/20 0800 08/31/20 0802 08/31/20 0833  BP: (!) 171/111 (!) 162/98    Pulse: 89 88    Resp: (!) 21 20    Temp:    98 F (36.7 C)  TempSrc:    Oral  SpO2: 94% 95% 95%   Weight:      Height:        Intake/Output Summary (Last 24 hours) at 08/31/2020 0913 Last data filed at 08/31/2020 0603 Gross per 24 hour  Intake 1109.87 ml  Output 2100 ml  Net -990.13 ml   Last 3 Weights 08/29/2020 08/27/2020 08/04/2020  Weight (lbs) 129 lb 10.1 oz 129 lb 4.8 oz 135 lb  Weight (kg) 58.8 kg 58.65 kg 61.236 kg      Telemetry   Normal sinus rhythm with rates in the 80s. - Personally Reviewed  ECG  Yesterday - SR, short PR, . - Personally Reviewed  Physical Exam   GEN: No acute distress.   Neck: No JVD Cardiac: RRR, no murmurs, rubs, or gallops.  Respiratory: Clear, on HFNC GI: Soft, nontender, non-distended  MS: No edema; No deformity. Neuro:  Nonfocal  Psych: Normal affect    Labs    High Sensitivity Troponin:   Recent Labs  Lab 08/09/20 0636 08/09/20 1002 08/22/2020 1225 08/06/2020 1545 08/29/20 0819  TROPONINIHS 10 10 16  18* 11  Chemistry Recent Labs  Lab 08/29/20 0420 08/30/20 0255 08/31/20 0400  NA 137 142 142  K 4.4 3.6 4.2  CL 101 106 105  CO2 26 21* 28  GLUCOSE 160* 135* 154*  BUN 23 29* 28*  CREATININE 1.01 1.08 0.92  CALCIUM 9.2 8.8* 8.8*  PROT 6.7 6.1* 6.2*  ALBUMIN 2.4* 2.3* 2.3*  AST 49* 69* 99*  ALT 56* 56* 70*  ALKPHOS 104 97 100  BILITOT 0.4 0.3 0.3  GFRNONAA >60 >60 >60  ANIONGAP 10 15 9      Hematology Recent Labs  Lab 08/29/20 0420 08/30/20 0255 08/31/20 0400  WBC 20.3* 17.8* 22.4*  RBC 4.16* 4.22 4.31  HGB 11.1* 11.0* 11.5*  HCT 34.6* 35.1* 36.3*  MCV 83.2 83.2 84.2  MCH 26.7 26.1 26.7  MCHC 32.1 31.3 31.7  RDW 17.2* 17.5* 17.5*  PLT 364 339 340    BNP Recent Labs  Lab 08/22/2020 1225 08/29/20 0430  BNP 59.1 219.4*     DDimer  Recent Labs   Lab 08/29/20 0400  DDIMER 7.68*     Radiology    DG CHEST PORT 1 VIEW  Result Date: 08/30/2020 CLINICAL DATA:  Hypoxia EXAM: PORTABLE CHEST 1 VIEW COMPARISON:  08/29/2020 FINDINGS: Multiple pulmonary nodules as noted on CT. Bilateral airspace infiltrates right greater than left stable. No pneumothorax or pleural effusion. Heart size and vascularity normal. Port-A-Cath in the SVC. IMPRESSION: Stable chest x-ray. Bilateral lung nodules and bilateral infiltrates right greater than left. Electronically Signed   By: Franchot Gallo M.D.   On: 08/30/2020 08:07   VAS [place] LOWER EXTREMITY VENOUS (DVT)  Result Date: 08/29/2020  Lower Venous DVT Study Patient Name:  Grant Taylor  Date of Exam:   08/29/2020 Medical Rec #: [id number]                 Accession #:    [id number] Date of Birth: [birth year]                 Patient Gender: M Patient Age:   75Y Exam Location:  [facility] Procedure:      VAS [place] LOWER EXTREMITY VENOUS (DVT) Referring Phys: GRACE BOWSER --------------------------------------------------------------------------------  Indications: SOB, history of metastatic cancer.  Comparison Study: No prior studies. Performing Technologist: Darlin Coco RDMS,RVT  Examination Guidelines: A complete evaluation includes B-mode imaging, spectral Doppler, color Doppler, and power Doppler as needed of all accessible portions of each vessel. Bilateral testing is considered an integral part of a complete examination. Limited examinations for reoccurring indications may be performed as noted. The reflux portion of the exam is performed with the patient in reverse Trendelenburg.  +---------+---------------+---------+-----------+----------+--------------+ RIGHT    CompressibilityPhasicitySpontaneityPropertiesThrombus Aging +---------+---------------+---------+-----------+----------+--------------+ CFV      Full           Yes      Yes                                  +---------+---------------+---------+-----------+----------+--------------+ SFJ      Full                                                        +---------+---------------+---------+-----------+----------+--------------+ FV Prox  Full                                                        +---------+---------------+---------+-----------+----------+--------------+  FV Mid   Full                                                        +---------+---------------+---------+-----------+----------+--------------+ FV DistalFull                                                        +---------+---------------+---------+-----------+----------+--------------+ PFV      Full                                                        +---------+---------------+---------+-----------+----------+--------------+ POP      Full           Yes      Yes                                 +---------+---------------+---------+-----------+----------+--------------+ PTV      Full                                                        +---------+---------------+---------+-----------+----------+--------------+ PERO     Full                                                        +---------+---------------+---------+-----------+----------+--------------+   +---------+---------------+---------+-----------+----------+--------------+ LEFT     CompressibilityPhasicitySpontaneityPropertiesThrombus Aging +---------+---------------+---------+-----------+----------+--------------+ CFV      Full           Yes      Yes                                 +---------+---------------+---------+-----------+----------+--------------+ SFJ      Full                                                        +---------+---------------+---------+-----------+----------+--------------+ FV Prox  Full                                                         +---------+---------------+---------+-----------+----------+--------------+ FV Mid   Full                                                        +---------+---------------+---------+-----------+----------+--------------+  FV DistalFull                                                        +---------+---------------+---------+-----------+----------+--------------+ PFV      Full                                                        +---------+---------------+---------+-----------+----------+--------------+ POP      Full           Yes      Yes                                 +---------+---------------+---------+-----------+----------+--------------+ PTV      Full                                                        +---------+---------------+---------+-----------+----------+--------------+ PERO     Full                                                        +---------+---------------+---------+-----------+----------+--------------+     Summary: RIGHT: - There is no evidence of deep vein thrombosis in the lower extremity.  - No cystic structure found in the popliteal fossa.  LEFT: - There is no evidence of deep vein thrombosis in the lower extremity.  - No cystic structure found in the popliteal fossa.  *See table(s) above for measurements and observations. Electronically signed by Servando Snare MD on 08/29/2020 at 3:49:51 PM.    Final    ECHOCARDIOGRAM LIMITED  Result Date: 08/29/2020    ECHOCARDIOGRAM LIMITED REPORT   Patient Name:   Grant Taylor Date of Exam: 08/29/2020 Medical Rec #:  [id number]                Height:       67.0 in Accession #:    [id number]               Weight:       129.6 lb Date of Birth:  12/05/1945                BSA:          1.682 m Patient Age:    70 years                 BP:           121/75 mmHg Patient Gender: M                        HR:           170 bpm. Exam Location:  Inpatient Procedure: Limited Echo, Color Doppler and Cardiac  Doppler Indications:    I48.91* Unspecified atrial fibrillation  History:  Patient has prior history of Echocardiogram examinations, most                 recent 08/09/2020. COPD; Risk Factors:Hypertension, Dyslipidemia                 and Sleep Apnea.  Sonographer:    Raquel Sarna Senior RDCS Referring Phys: [id number] Sharion Dove DANIELS  Sonographer Comments: Heart rate has been significantly elevated since admission per RN. Echo performed to rule out structural cause. IMPRESSIONS  1. Left ventricular ejection fraction, by estimation, is 60 to 65%. The left ventricle has normal function. The left ventricle has no regional wall motion abnormalities. Left ventricular diastolic parameters are indeterminate.  2. Right ventricular systolic function is normal. The right ventricular size is normal. There is normal pulmonary artery systolic pressure. The estimated right ventricular systolic pressure is 17.0 mmHg.  3. The mitral valve is normal in structure. Trivial mitral valve regurgitation. No evidence of mitral stenosis.  4. The aortic valve is tricuspid. Aortic valve regurgitation is not visualized. Mild aortic valve sclerosis is present, with no evidence of aortic valve stenosis.  5. The inferior vena cava is normal in size with greater than 50% respiratory variability, suggesting right atrial pressure of 3 mmHg.  6. The patient was in atrial fibrillation. FINDINGS  Left Ventricle: Left ventricular ejection fraction, by estimation, is 60 to 65%. The left ventricle has normal function. The left ventricle has no regional wall motion abnormalities. The left ventricular internal cavity size was normal in size. There is  no left ventricular hypertrophy. Left ventricular diastolic parameters are indeterminate. Right Ventricle: The right ventricular size is normal. No increase in right ventricular wall thickness. Right ventricular systolic function is normal. There is normal pulmonary artery systolic pressure. The tricuspid  regurgitant velocity is 2.69 m/s, and  with an assumed right atrial pressure of 3 mmHg, the estimated right ventricular systolic pressure is 01.7 mmHg. Left Atrium: Left atrial size was normal in size. Right Atrium: Right atrial size was normal in size. Pericardium: There is no evidence of pericardial effusion. Mitral Valve: The mitral valve is normal in structure. Mild mitral annular calcification. Trivial mitral valve regurgitation. No evidence of mitral valve stenosis. Tricuspid Valve: The tricuspid valve is normal in structure. Tricuspid valve regurgitation is mild. Aortic Valve: The aortic valve is tricuspid. Aortic valve regurgitation is not visualized. Mild aortic valve sclerosis is present, with no evidence of aortic valve stenosis. Venous: The inferior vena cava is normal in size with greater than 50% respiratory variability, suggesting right atrial pressure of 3 mmHg. IAS/Shunts: No atrial level shunt detected by color flow Doppler. AORTIC VALVE LVOT Vmax:   119.50 cm/s LVOT Vmean:  76.867 cm/s LVOT VTI:    0.165 m TRICUSPID VALVE TR Peak grad:   28.9 mmHg TR Vmax:        269.00 cm/s  SHUNTS Systemic VTI: 0.17 m Loralie Champagne MD Electronically signed by Loralie Champagne MD Signature Date/Time: 08/29/2020/4:59:05 PM    Final     Cardiac Studies   Echocardiogram 08/29/2020: Impressions: 1. Left ventricular ejection fraction, by estimation, is 60 to 65%. The  left ventricle has normal function. The left ventricle has no regional  wall motion abnormalities. Left ventricular diastolic parameters are  indeterminate.   2. Right ventricular systolic function is normal. The right ventricular  size is normal. There is normal pulmonary artery systolic pressure. The  estimated right ventricular systolic pressure is 49.4 mmHg.   3. The mitral valve is  normal in structure. Trivial mitral valve  regurgitation. No evidence of mitral stenosis.   4. The aortic valve is tricuspid. Aortic valve regurgitation is not   visualized. Mild aortic valve sclerosis is present, with no evidence of  aortic valve stenosis.   5. The inferior vena cava is normal in size with greater than 50%  respiratory variability, suggesting right atrial pressure of 3 mmHg.   6. The patient was in atrial fibrillation.    Patient Profile     75 y.o. male with a history of COPD, obstructive sleep apnea, hypertension, hyperlipidemia, GERD, prior tobacco use (quit 5-6 years ago), and recently diagnosed metastatic cancer who was admitted on 08/22/2020 for acute hypoxic respiratory failure secondary to pneumonia and metastatic cancer. Cardiology consulted on 08/29/2020 for evaluation of  is being seen 08/29/2020 for the evaluation of new onset atrial fibrillation with RVR at the request of Dr. Grandville Silos.  Assessment & Plan    New Onset Atrial Fibrillation with RVR - now in sinus, continuing amiodarone IV through initial chemotherapy.  - Potassium 4.2 today. - TSH normal. - Echo showed normal LVEF of 60-65%. - CHA2DS2-VASc = 3 (HTN and age x2). After converting to SR [facility] has been held due to hemoptysis. May warrant review prior to hospital dismissal. On sq prophylactic lovenox dosing for DVT prevention.  Acute Hypoxic Respiratory Failure - improved. I don't think he needs any additional IV Lasix today.  - Management per primary team.  Chest Pain - Patient complained of chest pain during episodes of atrial fibrillation with RVR. Resolved with restoration of sinus rhythm. - EKG shows mild ST depression in lateral leads and some T wave inversion in inferior leads when rates were in the 170s to 180s. - High-sensitivity troponin 18 on admission and repeat 11 which is reassuring. - Echo shows normal LV function and wall motion. - Pain secondary to atrial fibrillation with RVR. No ischemic work-up necessary at this time.   Hypertension - BP mildly elevated - Stopping IV Diltiazem and switching to PO 120 mg daily --> will increase the dose  today for better BP control.   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  - COPD - Severe protein-calorie malnutrition  For questions or updates, please contact [facility] Please consult [email or web address] for contact info under     Elouise Munroe, MD 08/31/20 9:13 AM

## 2020-08-31 NOTE — Progress Notes (Signed)
Overall, Mr. Medford is relatively stable.  He is on Lovenox now.  He still on the amiodarone drip.  His monitor seems to show sinus rhythm.  Heart rate is around 80 to.  He still has a BiPAP on.  His oxygen saturation is 96%.  Chest x-ray yesterday showed the bilateral infiltrates.  Because of the incredibly hard work from pharmacy, [place], and ICU, he will start chemotherapy today.  I truly, truly appreciate the incredible effort that everybody made to try to treat him.  I really think that if we get treatment into him, he will improve.  I would think given that this is a small cell carcinoma, that there should be a relatively quick response.  His labs show a white count 22.4. Hemoglobin 11.5.  Platelet count 340,000.  The BUN is 28 creatinine is 0.92.  Calcium 8.8.  Albumin 2.3.  His liver function studies are a little on the elevated side.  Despite his abnormal labs, I do not see a problem with him starting treatment.  He is alert.  He is not having any pain although there is little bit of discomfort over on the left upper abdomen.  I suspect this might be from where he has the large pancreatic tumor.  His sputum has grown Enterococcus.  It is hard to say if this is truly a pathogen.  I would have to suspect that probably is.  I would make sure that this is being treated.  I think he is on Unasyn.  His vital signs all look pretty good.  His temperature is 98.4.  Pulse 85.  Blood pressure 160/97.  His lungs sound great on the left side.  He has good air movement on the left side.  There is decreased on the right side.  His cardiac exam is regular rate and rhythm.  Abdomen is soft.  Bowel sounds are present.  Extremity shows no clubbing.  Neurological exam is nonfocal.  Again, Mr. Miske will start treatment with carboplatinum/etoposide.  I do not see any reason not to start today.  His labs look okay from my perspective.  Again, I really really appreciate the incredibly hard work everybody  is putting in to try to help Mr. Goodrich.  He is a veteran that served in [place].  The least that we could do is help him and try to get him better.     Lattie Haw, MD  Proverbs 11:25

## 2020-08-31 NOTE — Progress Notes (Signed)
PROGRESS NOTE    Grant Taylor  AST:[id number] DOB: December 25, 1945 DOA: 08/21/2020 PCP: [name], MD   Chief Complaint  Patient presents with   Shortness of Breath    Brief Narrative:  75 year old with past medical history significant for hypertension, COPD, HLD, GERD, BPH, OSA who presents with severe cough, inability to eat, abdominal pain, previous diarrhea.  He was recently discharged with plans to follow-up at the [facility] however, he was not doing well at home with the severe cough.  His oxygen requirement increased at home.  He reports nausea. 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   Patient presented with worsening hypoxic respiratory failure, shortness of breath.  CT angio showed interval development of patchy consolidative opacities throughout the right lung which may represent superimposed infectious process or potentially lymphangitic spread of tumor.  Negative for PE.  Bulky mediastinal adenopathy narrows the right lower lobe pulmonary artery.  Interval increase in size of bulky mediastinal and hilar adenopathy as well as multiple bilateral pulmonary metastases.  Redemonstrated partially visualized large pancreatic mass. -Patient placed on IV antibiotics, oncology consulted patient was to start chemo however patient went to A. fib with RVR on 08/29/2020 transferred to the ICU placed on Cardizem drip and subsequently transition to amiodarone drip.  PCCM, cardiology consulted.     Assessment & Plan:   Principal Problem:   Acute on chronic respiratory failure with hypoxia (HCC) Active Problems:   New onset a-fib (HCC)   Essential hypertension   COPD (chronic obstructive pulmonary disease) (HCC)   Pancreatic mass   Pneumonia   Protein-calorie  malnutrition, severe   Small cell carcinoma (HCC)   Leukocytosis   Depression  #1 acute hypoxic respiratory failure likely multifactorial secondary to pneumonia in the setting of worsening metastatic cancer/lymphangitic spread of cancer/COPD -Patient with increasing O2 requirements up to 14 L high flow nasal cannula and nonrebreather the morning of 08/29/2020 and noted to have gone into A. fib with RVR.  -Patient also noted to have gone into A. fib with RVR overnight which likely also contributing to worsening respiratory distress. -Patient back in sinus rhythm and noted to be on BiPAP this morning and on high flow nasal cannula yesterday. -Urine strep pneumococcus antigen negative.  Urine Legionella antigen negative.  MRSA PCR negative. -Sputum with rare Enterococcus faecalis, no staph isolated. -Procalcitonin noted to be borderline. -ABG the morning of 08/29/2020, with a pH of 7.491/PCO2 of 29/PO2 of 63/bicarb of 22. -IV cefepime, IV azithromycin, IV Flagyl, IV vancomycin have been discontinued.  -Continue IV Unasyn . -Continue scheduled nebs, Mucinex, Claritin, PPI, IV Solu-Medrol, Pulmicort nebs.  -Likely taper IV steroids starting tomorrow. -Patient seen in consultation and being followed by oncology Dr. Marin Olp who discussed with patient's oncologist from [facility] and decision made to start inpatient chemotherapy which was to be started on 08/29/2020, however was postponed as patient had gone into A. fib with RVR.   -Patient currently in sinus rhythm.  -Status post 40 mg IV Lasix x1 with good urine output . -Patient with urine output of 2.1 L over the past 24 hours. -Patient currently in the ICU. -Patient was assessed by PCCM who have signed off as of 08/30/2020. -Patient for initiation of chemotherapy today per oncology recommendations.   2.  New onset A. fib with RVR CHA2DS2VASC score 3 -Patient  noted to go into A. fib with RVR the morning of 08/29/2020 with heart rates close into the 200s.    -Likely secondary to acute pulmonary issues as in problem #1. -Cardiac enzymes negative.   -Patient with recent 2D echo (08/09/2020) with a EF of 55 to 25%,KNLZ, grade 1 diastolic dysfunction, normal right ventricular systolic function, normal pulmonary artery systolic pressure, normal mitral valve structure, no mitral stenosis, trivial mitral valve regurgitation, tricuspid aortic valve, mild to moderate aortic valve sclerosis/calcification is present without any evidence of aortic stenosis. -Repeat 2D echo done 08/29/2020 with EF 60 to 65%,NWMA, normal mitral valve, no evidence of mitral stenosis, normal left atrial size. -Patient was on a Cardizem drip and amiodarone drip and subsequently converted back to normal sinus rhythm. -TSH within normal limits -Cardizem drip has been transitioned to oral Cardizem and dose being adjusted per cardiology. -Currently on amiodarone drip which will be continued per cardiology recommendations in anticipation of initiation of chemotherapy which could likely be an irritant per may need to. -PCCM discontinued IV heparin due to hemoptysis and patient currently on prophylactic dose Lovenox. -Per cardiology may need to reassess need for anticoagulation close to discharge.  -Cardiology following and appreciate input and recommendations  3.  Hypertension -BP initially noted to be soft and as such amlodipine initially held and was resumed on 08/28/2020 and subsequently discontinued on 08/29/2020 as patient noted to be in A. fib and was on a Cardizem and amiodarone drip. -Currently on oral Cardizem and amiodarone drip. -Follow.  4.  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 -Patient being followed by oncology was to be started on chemotherapy on 08/29/2020 however was postponed as  patient had gone into A. fib with RVR.   -Patient back in sinus rhythm and currently on amiodarone.   -Patient for chemotherapy today.   -Per oncology.   5.  Depression -Stable.   -Continue Wellbutrin, hydroxyzine for anxiety.    6.  Moderate protein calorie malnutrition in the setting of malignancy -Continue dronabinol -Continue nutritional supplementation with Ensure.  7.  Hyperkalemia -Status post 1 dose IV Lasix.   -Potassium of 4.2.   -Follow.    8.  Leukocytosis -Patient on IV Solu-Medrol. -Leukocytosis fluctuating currently at 22.4 from 17.8 from 20.3 from 22 from 16.4 on admission . -Continue IV antibiotics secondary to problem #1.    9.  Hyperglycemia -Secondary to IV steroids. -CBG at 152 this morning.  -Continue Lantus 10 units daily, SSI.   DVT prophylaxis: Lovenox Code Status: Full Family Communication: Updated patient, ex-wife and daughter at bedside. Disposition:   Status is: Inpatient  Remains inpatient appropriate because:Inpatient level of care appropriate due to severity of illness  Dispo: The patient is from: Home              Anticipated d/c is to: TBD              Patient currently is not medically stable to d/c.   Difficult to place patient No       Consultants:  Oncology: Dr. Marin Olp PCCM: Dr. Chase Caller 08/29/2020 Cardiology: Dr.Acharya 08/29/2020  Procedures:  CT angiogram chest 08/18/2020 Chest x-ray 08/12/2020, 08/28/2020, 08/29/2020 2D echo 08/29/2020  Antimicrobials:  IV Unasyn 08/28/2020>>>>> IV azithromycin 08/26/2020>>>>> 08/28/2020 IV cefepime 07/28/2020>>>>> 08/28/2020 IV Flagyl 08/26/2020>>>> 08/28/2020 IV vancomycin 07/27/2020>>>> 08/26/2020   Subjective: Patient states does not feel as  good as he did yesterday.  Complains of shortness of breath.  On BiPAP.  Denies any chest pain.  Complaining of some left upper abdominal pain.  Currently in normal sinus rhythm still on amiodarone drip.  Patient noted to have some hemoptysis 1 to 2 days ago  and also with some complaints of nasal bleeding this morning which has since resolved.  Patient complains of mild bilateral flank pain.  Objective: Vitals:   08/31/20 0800 08/31/20 0802 08/31/20 0833 08/31/20 0900  BP: (!) 162/98   (!) 165/103  Pulse: 88   82  Resp: 20   18  Temp:   98 F (36.7 C)   TempSrc:   Oral   SpO2: 95% 95%  97%  Weight:      Height:        Intake/Output Summary (Last 24 hours) at 08/31/2020 1017 Last data filed at 08/31/2020 0603 Gross per 24 hour  Intake 1096.87 ml  Output 2100 ml  Net -1003.13 ml    Filed Weights   08/19/2020 1952 08/27/20 1559 08/29/20 0630  Weight: 61.2 kg 58.7 kg 58.8 kg    Examination:  General exam: On BiPAP. Respiratory system: Coarse breath sounds right base.  No wheezing.  Fair air movement.  On BiPAP. Cardiovascular system: Regular rate rhythm no murmurs rubs or gallops.  No JVD.  No lower extremity edema.   Gastrointestinal system: Abdomen is soft, some tenderness to palpation left mid upper abdominal region, nondistended, positive bowel sounds.  No rebound.  No guarding. Central nervous system: Alert and oriented.  No focal neurological deficits.  Moving extremity spontaneously. Extremities: Symmetric 5 x 5 power. Skin: No rashes, lesions or ulcers Psychiatry: Judgement and insight appear normal. Mood & affect appropriate.     Data Reviewed: I have personally reviewed following labs and imaging studies  CBC: Recent Labs  Lab 08/27/20 0301 08/28/20 0919 08/29/20 0420 08/30/20 0255 08/31/20 0400  WBC 16.4* 22.0* 20.3* 17.8* 22.4*  NEUTROABS 15.4* 21.0* 19.1* 16.4* 21.2*  HGB 10.0* 11.1* 11.1* 11.0* 11.5*  HCT 31.0* 35.1* 34.6* 35.1* 36.3*  MCV 82.2 84.0 83.2 83.2 84.2  PLT 317 368 364 339 340     Basic Metabolic Panel: Recent Labs  Lab 08/27/20 0301 08/28/20 0919 08/29/20 0420 08/30/20 0255 08/31/20 0400  NA 130* 138 137 142 142  K 5.4* 4.0 4.4 3.6 4.2  CL 100 99 101 106 105  CO2 23 26 26  21* 28   GLUCOSE 204* 173* 160* 135* 154*  BUN 16 19 23  29* 28*  CREATININE 0.99 0.94 1.01 1.08 0.92  CALCIUM 8.9 9.5 9.2 8.8* 8.8*  MG  --  2.0 2.3 2.3  --   PHOS  --   --  3.5  --   --      GFR: Estimated Creatinine Clearance: 56.8 mL/min (by C-G formula based on SCr of 0.92 mg/dL).  Liver Function Tests: Recent Labs  Lab 08/16/2020 1225 08/25/20 0838 08/29/20 0420 08/30/20 0255 08/31/20 0400  AST 85* 80* 49* 69* 99*  ALT 50* 46* 56* 56* 70*  ALKPHOS 93 89 104 97 100  BILITOT 0.6 0.7 0.4 0.3 0.3  PROT 6.3* 5.9* 6.7 6.1* 6.2*  ALBUMIN 2.0* 1.8* 2.4* 2.3* 2.3*     CBG: Recent Labs  Lab 08/30/20 0733 08/30/20 1111 08/30/20 1607 08/30/20 2111 08/31/20 0828  GLUCAP 155* 148* 322* 97 152*      Recent Results (from the past 240 hour(s))  Resp Panel by RT-PCR (Flu A&B,  Covid) Nasopharyngeal Swab     Status: None   Collection Time: 08/23/2020  1:34 PM   Specimen: Nasopharyngeal Swab; Nasopharyngeal(NP) swabs in vial transport medium  Result Value Ref Range Status   SARS Coronavirus 2 by RT PCR NEGATIVE NEGATIVE Final    Comment: (NOTE) SARS-CoV-2 target nucleic acids are NOT DETECTED.  The SARS-CoV-2 RNA is generally detectable in upper respiratory specimens during the acute phase of infection. The lowest concentration of SARS-CoV-2 viral copies this assay can detect is 138 copies/mL. A negative result does not preclude SARS-Cov-2 infection and should not be used as the sole basis for treatment or other patient management decisions. A negative result may occur with  improper specimen collection/handling, submission of specimen other than nasopharyngeal swab, presence of viral mutation(s) within the areas targeted by this assay, and inadequate number of viral copies(<138 copies/mL). A negative result must be combined with clinical observations, patient history, and epidemiological information. The expected result is Negative.  Fact Sheet for Patients:   EntrepreneurPulse.com.au  Fact Sheet for Healthcare Providers:  IncredibleEmployment.be  This test is no t yet approved or cleared by the [place] FDA and  has been authorized for detection and/or diagnosis of SARS-CoV-2 by FDA under an Emergency Use Authorization (EUA). This EUA will remain  in effect (meaning this test can be used) for the duration of the COVID-19 declaration under Section 564(b)(1) of the Act, 21 U.S.C.section 360bbb-3(b)(1), unless the authorization is terminated  or revoked sooner.       Influenza A by PCR NEGATIVE NEGATIVE Final   Influenza B by PCR NEGATIVE NEGATIVE Final    Comment: (NOTE) The Xpert Xpress SARS-CoV-2/FLU/RSV plus assay is intended as an aid in the diagnosis of influenza from Nasopharyngeal swab specimens and should not be used as a sole basis for treatment. Nasal washings and aspirates are unacceptable for Xpert Xpress SARS-CoV-2/FLU/RSV testing.  Fact Sheet for Patients: EntrepreneurPulse.com.au  Fact Sheet for Healthcare Providers: IncredibleEmployment.be  This test is not yet approved or cleared by the [place] FDA and has been authorized for detection and/or diagnosis of SARS-CoV-2 by FDA under an Emergency Use Authorization (EUA). This EUA will remain in effect (meaning this test can be used) for the duration of the COVID-19 declaration under Section 564(b)(1) of the Act, 21 U.S.C. section 360bbb-3(b)(1), unless the authorization is terminated or revoked.  Performed at [facility], [place] [address], [place], [place] [address]   Blood Culture (routine x 2)     Status: None   Collection Time: 08/12/2020  1:34 PM   Specimen: BLOOD RIGHT FOREARM  Result Value Ref Range Status   Specimen Description BLOOD RIGHT FOREARM  Final   Special Requests   Final    BOTTLES DRAWN AEROBIC AND ANAEROBIC Blood Culture adequate volume   Culture   Final    NO  GROWTH 5 DAYS Performed at [facility], [place] [address] [place], [place] [address]    Report Status 08/29/2020 FINAL  Final  Blood Culture (routine x 2)     Status: None   Collection Time: 07/31/2020  6:10 PM   Specimen: BLOOD  Result Value Ref Range Status   Specimen Description BLOOD PORTA CATH  Final   Special Requests   Final    BOTTLES DRAWN AEROBIC AND ANAEROBIC Blood Culture adequate volume   Culture   Final    NO GROWTH 5 DAYS Performed at [facility], [place] [address], [place], [place] [address]  Report Status 08/29/2020 FINAL  Final  MRSA Next Gen by PCR, Nasal     Status: None   Collection Time: 08/25/20  8:38 AM   Specimen: Nasal Mucosa; Nasal Swab  Result Value Ref Range Status   MRSA by PCR Next Gen NOT DETECTED NOT DETECTED Final    Comment: (NOTE) The GeneXpert MRSA Assay (FDA approved for NASAL specimens only), is one component of a comprehensive MRSA colonization surveillance program. It is not intended to diagnose MRSA infection nor to guide or monitor treatment for MRSA infections. Test performance is not FDA approved in patients less than 57 years old. Performed at [facility], [place] [address], [place], [place] [address]   Expectorated Sputum Assessment w Gram Stain, Rflx to Resp Cult     Status: None   Collection Time: 08/25/20 10:08 AM   Specimen: Expectorated Sputum  Result Value Ref Range Status   Specimen Description EXPECTORATED SPUTUM  Final   Special Requests NONE  Final   Sputum evaluation   Final    THIS SPECIMEN IS ACCEPTABLE FOR SPUTUM CULTURE Performed at [facility], [place] [address] [place], [place] [address]    Report Status 08/26/2020 FINAL  Final  Culture, Respiratory w Gram Stain     Status: None   Collection Time: 08/25/20 10:08 AM  Result Value Ref Range Status   Specimen Description EXPECTORATED SPUTUM  Final   Special Requests NONE Reflexed from [id number]  Final   Gram Stain   Final    RARE WBC  PRESENT,BOTH PMN AND MONONUCLEAR RARE GRAM POSITIVE COCCI IN PAIRS    Culture   Final    RARE ENTEROCOCCUS FAECALIS NO STAPHYLOCOCCUS AUREUS ISOLATED No Pseudomonas species isolated Performed at [facility], [place] [address] [place], [place] [address]    Report Status 08/28/2020 FINAL  Final   Organism ID, Bacteria ENTEROCOCCUS FAECALIS  Final      Susceptibility   Enterococcus faecalis - MIC*    AMPICILLIN <=2 SENSITIVE Sensitive     VANCOMYCIN 1 SENSITIVE Sensitive     GENTAMICIN SYNERGY SENSITIVE Sensitive     * RARE ENTEROCOCCUS FAECALIS          Radiology Studies: DG CHEST PORT 1 VIEW  Result Date: 08/30/2020 CLINICAL DATA:  Hypoxia EXAM: PORTABLE CHEST 1 VIEW COMPARISON:  08/29/2020 FINDINGS: Multiple pulmonary nodules as noted on CT. Bilateral airspace infiltrates right greater than left stable. No pneumothorax or pleural effusion. Heart size and vascularity normal. Port-A-Cath in the SVC. IMPRESSION: Stable chest x-ray. Bilateral lung nodules and bilateral infiltrates right greater than left. Electronically Signed   By: Franchot Gallo M.D.   On: 08/30/2020 08:07   VAS [place] LOWER EXTREMITY VENOUS (DVT)  Result Date: 08/29/2020  Lower Venous DVT Study Patient Name:  Grant Taylor  Date of Exam:   08/29/2020 Medical Rec #: [id number]                 Accession #:    [id number] Date of Birth: 1945-09-16                 Patient Gender: M Patient Age:   75Y Exam Location:  [facility] Procedure:      VAS [place] LOWER EXTREMITY VENOUS (DVT) Referring Phys: GRACE BOWSER --------------------------------------------------------------------------------  Indications: SOB, history of metastatic cancer.  Comparison Study: No prior studies. Performing Technologist: Darlin Coco RDMS,RVT  Examination Guidelines: A complete evaluation includes B-mode imaging, spectral Doppler, color Doppler, and  power Doppler as needed of all accessible portions of each vessel. Bilateral testing  is considered an integral part of a complete examination. Limited examinations for reoccurring indications may be performed as noted. The reflux portion of the exam is performed with the patient in reverse Trendelenburg.  +---------+---------------+---------+-----------+----------+--------------+ RIGHT    CompressibilityPhasicitySpontaneityPropertiesThrombus Aging +---------+---------------+---------+-----------+----------+--------------+ CFV      Full           Yes      Yes                                 +---------+---------------+---------+-----------+----------+--------------+ SFJ      Full                                                        +---------+---------------+---------+-----------+----------+--------------+ FV Prox  Full                                                        +---------+---------------+---------+-----------+----------+--------------+ FV Mid   Full                                                        +---------+---------------+---------+-----------+----------+--------------+ FV DistalFull                                                        +---------+---------------+---------+-----------+----------+--------------+ PFV      Full                                                        +---------+---------------+---------+-----------+----------+--------------+ POP      Full           Yes      Yes                                 +---------+---------------+---------+-----------+----------+--------------+ PTV      Full                                                        +---------+---------------+---------+-----------+----------+--------------+ PERO     Full                                                        +---------+---------------+---------+-----------+----------+--------------+   +---------+---------------+---------+-----------+----------+--------------+  LEFT      CompressibilityPhasicitySpontaneityPropertiesThrombus Aging +---------+---------------+---------+-----------+----------+--------------+ CFV      Full           Yes      Yes                                 +---------+---------------+---------+-----------+----------+--------------+ SFJ      Full                                                        +---------+---------------+---------+-----------+----------+--------------+ FV Prox  Full                                                        +---------+---------------+---------+-----------+----------+--------------+ FV Mid   Full                                                        +---------+---------------+---------+-----------+----------+--------------+ FV DistalFull                                                        +---------+---------------+---------+-----------+----------+--------------+ PFV      Full                                                        +---------+---------------+---------+-----------+----------+--------------+ POP      Full           Yes      Yes                                 +---------+---------------+---------+-----------+----------+--------------+ PTV      Full                                                        +---------+---------------+---------+-----------+----------+--------------+ PERO     Full                                                        +---------+---------------+---------+-----------+----------+--------------+     Summary: RIGHT: - There is no evidence of deep vein thrombosis in the lower extremity.  - No cystic structure found in the popliteal fossa.  LEFT: - There is no evidence of deep vein thrombosis in the lower extremity.  - No  cystic structure found in the popliteal fossa.  *See table(s) above for measurements and observations. Electronically signed by Servando Snare MD on 08/29/2020 at 3:49:51 PM.    Final    ECHOCARDIOGRAM  LIMITED  Result Date: 08/29/2020    ECHOCARDIOGRAM LIMITED REPORT   Patient Name:   Grant Taylor Date of Exam: 08/29/2020 Medical Rec #:  [id number]                Height:       67.0 in Accession #:    [id number]               Weight:       129.6 lb Date of Birth:  [birth year]                BSA:          1.682 m Patient Age:    63 years                 BP:           121/75 mmHg Patient Gender: M                        HR:           170 bpm. Exam Location:  Inpatient Procedure: Limited Echo, Color Doppler and Cardiac Doppler Indications:    I48.91* Unspecified atrial fibrillation  History:        Patient has prior history of Echocardiogram examinations, most                 recent 08/09/2020. COPD; Risk Factors:Hypertension, Dyslipidemia                 and Sleep Apnea.  Sonographer:    Raquel Sarna Senior RDCS Referring Phys: [id number] Sharion Dove DANIELS  Sonographer Comments: Heart rate has been significantly elevated since admission per RN. Echo performed to rule out structural cause. IMPRESSIONS  1. Left ventricular ejection fraction, by estimation, is 60 to 65%. The left ventricle has normal function. The left ventricle has no regional wall motion abnormalities. Left ventricular diastolic parameters are indeterminate.  2. Right ventricular systolic function is normal. The right ventricular size is normal. There is normal pulmonary artery systolic pressure. The estimated right ventricular systolic pressure is 30.8 mmHg.  3. The mitral valve is normal in structure. Trivial mitral valve regurgitation. No evidence of mitral stenosis.  4. The aortic valve is tricuspid. Aortic valve regurgitation is not visualized. Mild aortic valve sclerosis is present, with no evidence of aortic valve stenosis.  5. The inferior vena cava is normal in size with greater than 50% respiratory variability, suggesting right atrial pressure of 3 mmHg.  6. The patient was in atrial fibrillation. FINDINGS  Left Ventricle: Left ventricular  ejection fraction, by estimation, is 60 to 65%. The left ventricle has normal function. The left ventricle has no regional wall motion abnormalities. The left ventricular internal cavity size was normal in size. There is  no left ventricular hypertrophy. Left ventricular diastolic parameters are indeterminate. Right Ventricle: The right ventricular size is normal. No increase in right ventricular wall thickness. Right ventricular systolic function is normal. There is normal pulmonary artery systolic pressure. The tricuspid regurgitant velocity is 2.69 m/s, and  with an assumed right atrial pressure of 3 mmHg, the estimated right ventricular systolic pressure is 65.7 mmHg. Left Atrium: Left atrial size was normal in size. Right Atrium: Right atrial size was normal in  size. Pericardium: There is no evidence of pericardial effusion. Mitral Valve: The mitral valve is normal in structure. Mild mitral annular calcification. Trivial mitral valve regurgitation. No evidence of mitral valve stenosis. Tricuspid Valve: The tricuspid valve is normal in structure. Tricuspid valve regurgitation is mild. Aortic Valve: The aortic valve is tricuspid. Aortic valve regurgitation is not visualized. Mild aortic valve sclerosis is present, with no evidence of aortic valve stenosis. Venous: The inferior vena cava is normal in size with greater than 50% respiratory variability, suggesting right atrial pressure of 3 mmHg. IAS/Shunts: No atrial level shunt detected by color flow Doppler. AORTIC VALVE LVOT Vmax:   119.50 cm/s LVOT Vmean:  76.867 cm/s LVOT VTI:    0.165 m TRICUSPID VALVE TR Peak grad:   28.9 mmHg TR Vmax:        269.00 cm/s  SHUNTS Systemic VTI: 0.17 m Loralie Champagne MD Electronically signed by Loralie Champagne MD Signature Date/Time: 08/29/2020/4:59:05 PM    Final         Scheduled Meds:  budesonide (PULMICORT) nebulizer solution  0.5 mg Nebulization BID   buPROPion  300 mg Oral Daily   CARBOplatin  290 mg Intravenous Once    chlorhexidine  15 mL Mouth Rinse BID   Chlorhexidine Gluconate Cloth  6 each Topical Daily   diltiazem  240 mg Oral Daily   dronabinol  2.5 mg Oral BID AC   enoxaparin (LOVENOX) injection  40 mg Subcutaneous Q24H   etoposide  96 mg/m2 (Treatment Plan Recorded) Intravenous Once   [START ON 09/01/2020] etoposide  96 mg/m2 (Treatment Plan Recorded) Intravenous Once   feeding supplement  237 mL Oral TID BM   gabapentin  300 mg Oral TID   guaiFENesin  1,200 mg Oral BID   insulin aspart  0-15 Units Subcutaneous TID WC   insulin glargine-yfgn  10 Units Subcutaneous Daily   ipratropium  0.5 mg Nebulization TID   levalbuterol  0.63 mg Nebulization TID   loratadine  10 mg Oral Daily   mouth rinse  15 mL Mouth Rinse q12n4p   methylPREDNISolone (SOLU-MEDROL) injection  80 mg Intravenous Q6H   multivitamin with minerals  1 tablet Oral Daily   palonosetron  0.25 mg Intravenous Once   pantoprazole  40 mg Oral Daily   polyethylene glycol  17 g Oral Daily   sodium chloride flush  10-40 mL Intracatheter Q12H   sodium chloride flush  3 mL Intravenous Q12H   Continuous Infusions:  sodium chloride     [START ON 09/01/2020] sodium chloride     amiodarone 30 mg/hr (08/31/20 0603)   ampicillin-sulbactam (UNASYN) IV Stopped (08/31/20 0441)   fosaprepitant (EMEND) IV infusion 150 mg       LOS: 7 days    Time spent: 45 minutes    Irine Seal, MD [facility]   To contact the attending provider between 7A-7P or the covering provider during after hours 7P-7A, please log into the web site [email or web address] and access using universal [place] password for that web site. If you do not have the password, please call the hospital operator.  08/31/2020, 10:17 AM

## 2020-09-01 DIAGNOSIS — J9621 Acute and chronic respiratory failure with hypoxia: Secondary | ICD-10-CM | POA: Diagnosis not present

## 2020-09-01 DIAGNOSIS — F32A Depression, unspecified: Secondary | ICD-10-CM | POA: Diagnosis not present

## 2020-09-01 DIAGNOSIS — I10 Essential (primary) hypertension: Secondary | ICD-10-CM | POA: Diagnosis not present

## 2020-09-01 DIAGNOSIS — J440 Chronic obstructive pulmonary disease with acute lower respiratory infection: Secondary | ICD-10-CM | POA: Diagnosis not present

## 2020-09-01 LAB — COMPREHENSIVE METABOLIC PANEL
ALT: 68 U/L — ABNORMAL HIGH (ref 0–44)
AST: 96 U/L — ABNORMAL HIGH (ref 15–41)
Albumin: 2.2 g/dL — ABNORMAL LOW (ref 3.5–5.0)
Alkaline Phosphatase: 90 U/L (ref 38–126)
Anion gap: 8 (ref 5–15)
BUN: 27 mg/dL — ABNORMAL HIGH (ref 8–23)
CO2: 28 mmol/L (ref 22–32)
Calcium: 8.5 mg/dL — ABNORMAL LOW (ref 8.9–10.3)
Chloride: 104 mmol/L (ref 98–111)
Creatinine, Ser: 0.92 mg/dL (ref 0.61–1.24)
GFR, Estimated: >60 mL/min (ref >60)
Glucose, Bld: 139 mg/dL — ABNORMAL HIGH (ref 70–99)
Potassium: 3.8 mmol/L (ref 3.5–5.1)
Sodium: 140 mmol/L (ref 135–145)
Total Bilirubin: 0.5 mg/dL (ref 0.3–1.2)
Total Protein: 6.1 g/dL — ABNORMAL LOW (ref 6.5–8.1)

## 2020-09-01 LAB — GLUCOSE, CAPILLARY
Glucose-Capillary: 137 mg/dL — ABNORMAL HIGH (ref 70–99)
Glucose-Capillary: 291 mg/dL — ABNORMAL HIGH (ref 70–99)
Glucose-Capillary: 197 mg/dL — ABNORMAL HIGH (ref 70–99)
Glucose-Capillary: 88 mg/dL (ref 70–99)

## 2020-09-01 LAB — CBC WITH DIFFERENTIAL/PLATELET
Abs Immature Granulocytes: 0.19 10*3/uL — ABNORMAL HIGH (ref 0.00–0.07)
Basophils Absolute: 0 10*3/uL (ref 0.0–0.1)
Basophils Relative: 0 %
Eosinophils Absolute: 0 10*3/uL (ref 0.0–0.5)
Eosinophils Relative: 0 %
HCT: 36.2 % — ABNORMAL LOW (ref 39.0–52.0)
Hemoglobin: 11.4 g/dL — ABNORMAL LOW (ref 13.0–17.0)
Immature Granulocytes: 1 %
Lymphocytes Relative: 1 %
Lymphs Abs: 0.3 10*3/uL — ABNORMAL LOW (ref 0.7–4.0)
MCH: 26.3 pg (ref 26.0–34.0)
MCHC: 31.5 g/dL (ref 30.0–36.0)
MCV: 83.6 fL (ref 80.0–100.0)
Monocytes Absolute: 0.5 10*3/uL (ref 0.1–1.0)
Monocytes Relative: 2 %
Neutro Abs: 23.2 10*3/uL — ABNORMAL HIGH (ref 1.7–7.7)
Neutrophils Relative %: 96 %
Platelets: 269 10*3/uL (ref 150–400)
RBC: 4.33 MIL/uL (ref 4.22–5.81)
RDW: 17.8 % — ABNORMAL HIGH (ref 11.5–15.5)
WBC: 24.2 10*3/uL — ABNORMAL HIGH (ref 4.0–10.5)
nRBC: 0 % (ref 0.0–0.2)

## 2020-09-01 LAB — MAGNESIUM: Magnesium: 2.4 mg/dL (ref 1.7–2.4)

## 2020-09-01 MED ORDER — METHYLPREDNISOLONE SODIUM SUCC 125 MG IJ SOLR
120.0000 mg | Freq: Two times a day (BID) | INTRAMUSCULAR | Status: DC
  Administered 2020-09-01 – 2020-09-02 (×3): 120 mg via INTRAVENOUS
  Filled 2020-09-01 (×3): qty 2

## 2020-09-01 MED ORDER — MORPHINE SULFATE (PF) 2 MG/ML IV SOLN
2.0000 mg | INTRAVENOUS | Status: DC | PRN
Start: 2020-09-01 — End: 2020-09-01
  Administered 2020-09-01 (×2): 2 mg via INTRAVENOUS
  Filled 2020-09-01 (×2): qty 1

## 2020-09-01 MED ORDER — HYDROMORPHONE HCL 1 MG/ML IJ SOLN
1.0000 mg | INTRAMUSCULAR | Status: DC | PRN
Start: 2020-09-01 — End: 2020-09-04
  Administered 2020-09-01: 2 mg via INTRAVENOUS
  Administered 2020-09-02 – 2020-09-04 (×7): 1 mg via INTRAVENOUS
  Filled 2020-09-01 (×2): qty 1
  Filled 2020-09-01: qty 2
  Filled 2020-09-01: qty 1
  Filled 2020-09-01: qty 2
  Filled 2020-09-01 (×3): qty 1

## 2020-09-01 NOTE — Progress Notes (Signed)
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 The patient knows to call the clinic with any problems, questions or concerns.   The total time spent in the appointment was 25 minutes encounter with patients including review of chart and various tests results, discussions about plan of care and coordination of care plan  Heath Lark, MD 09/01/2020 1:31 PM  Subjective:  The patient is seen on behalf of of his primary oncologist who is absent He is started on cycle 1 of carboplatin and etoposide At the time of visit, he appears comfortable He has high flow oxygen in situ He denies pain or nausea  Objective:  Vitals:   09/01/20 1300 09/01/20 1311  BP: (!) 163/102   Pulse: 88   Resp: 14   Temp:    SpO2: 91% 90%     Intake/Output Summary (Last 24 hours) at 09/01/2020 1331 Last data filed at 09/01/2020 1156 Gross per 24 hour  Intake 1537.14 ml  Output 2550 ml  Net -1012.86 ml    GENERAL:alert, no distress and comfortable.  Noted mildly increased work of breathing SKIN: skin color, texture, turgor are normal, no rashes or significant lesions EYES: normal, Conjunctiva are pink and non-injected, sclera  clear OROPHARYNX:no exudate, no erythema and lips, buccal mucosa, and tongue normal  NECK: supple, thyroid normal size, non-tender, without nodularity LYMPH: Palpable lymphadenopathy in the left supraclavicular region  LUNGS: Coarse breath sounds bilaterally HEART: regular rate & rhythm and no murmurs and no lower extremity edema ABDOMEN:abdomen soft, non-tender and normal bowel sounds Musculoskeletal:no cyanosis of digits and no clubbing  NEURO: alert & oriented x 3 with fluent speech, no focal motor/sensory deficits   Labs:  Recent Labs    08/30/20 0255 08/31/20 0400 09/01/20 0500  NA 142 142 140  K 3.6 4.2 3.8  CL 106 105 104  CO2 21* 28 28  GLUCOSE 135* 154* 139*  BUN 29* 28* 27*  CREATININE 1.08 0.92 0.92  CALCIUM 8.8* 8.8* 8.5*  GFRNONAA >60 >60 >60  PROT 6.1* 6.2* 6.1*  ALBUMIN 2.3* 2.3* 2.2*  AST 69* 99* 96*  ALT 56* 70* 68*  ALKPHOS 97 100 90  BILITOT 0.3 0.3 0.5    Studies: I have reviewed his imaging studies DG Chest 2 View  Result Date: 08/28/2020 CLINICAL DATA:  Hypoxemic respiratory failure with sepsis and pneumonia. EXAM: CHEST - 2 VIEW COMPARISON:  08/23/2020 FINDINGS: Nodular density is seen throughout both lungs. There is bibasilar airspace disease right greater than left. Small left effusion. Port-A-Cath tip in the SVC. IMPRESSION: No significant interval change. Bilateral lung nodules and bilateral airspace disease right greater than left. Electronically Signed   By: Franchot Gallo M.D.   On: 08/28/2020 13:35   DG Chest 2  View  Result Date: 08/13/2020 CLINICAL DATA:  Acute shortness of breath EXAM: CHEST - 2 VIEW COMPARISON:  08/09/2020 FINDINGS: Cardiac shadow is stable. Lungs again demonstrate multiple bilateral nodular densities similar to that seen on the prior exam. Some superimposed acute infiltrate is noted particularly in the right base. Small effusions are noted bilaterally. No bony abnormality is seen. IMPRESSION: Stable metastatic disease  bilaterally. New superimposed right basilar infiltrate Electronically Signed   By: Inez Catalina M.D.   On: 08/13/2020 12:53   CT Angio Chest PE W and/or Wo Contrast  Result Date: 08/02/2020 CLINICAL DATA:  Patient with lung and pancreatic cancer. EXAM: CT ANGIOGRAPHY CHEST WITH CONTRAST TECHNIQUE: Multidetector CT imaging of the chest was performed using the standard protocol during bolus administration of intravenous contrast. Multiplanar CT image reconstructions and MIPs were obtained to evaluate the vascular anatomy. CONTRAST:  68mL OMNIPAQUE IOHEXOL 350 MG/ML SOLN COMPARISON:  Chest CT August 09, 2020. FINDINGS: Cardiovascular: Right anterior chest wall Port-A-Cath is present with tip terminating in the superior vena cava. Normal heart size. Thoracic aortic vascular calcifications. Adequate opacification of the pulmonary arteries. Narrowing of the proximal aspect of the right lower lobe pulmonary artery secondary to bulky mediastinal masses. Mediastinum/Nodes: There is a large poorly visualized left supraclavicular lymph node measuring up to 2.5 cm. No axillary adenopathy. Bulky mediastinal and bilateral hilar lymphadenopathy. Index prevascular lymph node has increased in size measuring 3.1 cm (image 41; series 7), previously 2.7 cm. Index right infrahilar lymph node measures 2.1 cm (image 62; series 7), previously 1.8 cm. Normal appearance of the esophagus. Lungs/Pleura: Central airways are patent. Interval increase in size of multiple of the extensive bilateral pulmonary metastasis. Reference subpleural left upper lobe nodule measures 2.0 cm (image 34; series 4), previously 1.5 cm. Reference left lower lobe nodule measures 2.8 cm (image 61; series 7), previously 1.9 cm. Interval development of patchy consolidative opacities throughout the right upper, right middle and right lower lobes. No pleural effusion or pneumothorax. Upper Abdomen: Incompletely visualized large pancreatic mass. Musculoskeletal: No  acute osseous lesions. Review of the MIP images confirms the above findings. IMPRESSION: Interval development of patchy consolidative opacities throughout the right lung which may represent superimposed infectious process or potentially lymphangitic spread of tumor. No evidence for acute pulmonary embolus. Bulky mediastinal adenopathy narrows the right lower lobe pulmonary artery. Interval increase in size of bulky mediastinal and hilar adenopathy as well as multiple bilateral pulmonary metastasis. Redemonstrated partially visualized large pancreatic mass. Electronically Signed   By: Lovey Newcomer M.D.   On: 07/31/2020 16:23   CT Angio Chest PE W/Cm &/Or Wo Cm  Result Date: 08/09/2020 CLINICAL DATA:  Shortness of breath.  Pancreatic mass. EXAM: CT ANGIOGRAPHY CHEST WITH CONTRAST TECHNIQUE: Multidetector CT imaging of the chest was performed using the standard protocol during bolus administration of intravenous contrast. Multiplanar CT image reconstructions and MIPs were obtained to evaluate the vascular anatomy. CONTRAST:  167mL OMNIPAQUE IOHEXOL 350 MG/ML SOLN COMPARISON:  None. FINDINGS: Cardiovascular: No filling defects in the pulmonary arteries to suggest pulmonary emboli. Heart is normal size. Aorta is normal caliber. Scattered aortic calcifications. Mediastinum/Nodes: Mediastinal and bilateral hilar adenopathy. No axillary adenopathy. Index prevascular lymph node has a short axis diameter of 2.7 cm. Lungs/Pleura: Extensive innumerable bilateral pulmonary nodules and masses. Index right lower lobe mass on image 111 measures 2.6 cm. Index left lower lobe mass on image 86 measures 2.5 cm. Small left pleural effusion. Upper Abdomen: Probable peritoneal implants in the left upper lobe  anterior to the spleen. See abdominal CT report for further discussion. Musculoskeletal: Chest wall soft tissues are unremarkable. No acute bony abnormality. Review of the MIP images confirms the above findings. IMPRESSION: No  evidence of pulmonary embolus. Innumerable bilateral pulmonary nodules and masses compatible metastases. Bulky mediastinal and bilateral hilar adenopathy. Small left pleural effusion. Electronically Signed   By: Rolm Baptise M.D.   On: 08/09/2020 08:55   CT Abdomen Pelvis W Contrast  Result Date: 08/09/2020 CLINICAL DATA:  Metastatic disease evaluation.  Pancreatic tumor. EXAM: CT ABDOMEN AND PELVIS WITH CONTRAST TECHNIQUE: Multidetector CT imaging of the abdomen and pelvis was performed using the standard protocol following bolus administration of intravenous contrast. CONTRAST:  134mL OMNIPAQUE IOHEXOL 350 MG/ML SOLN COMPARISON:  Chest CT today.  CT abdomen and pelvis 12/27/2019 FINDINGS: Lower chest: Innumerable masses in the lung bases. Small left pleural effusion. Hepatobiliary: Scattered low-density lesions throughout the liver which do not appear to be cystic and are concerning for metastases. Gallbladder unremarkable. Pancreas: Large mass in the pancreatic tail measures up to approximately 10 cm with extension anterior to the spleen and possible involvement of the anterior upper pole of the left kidney. Spleen: No focal abnormality.  Normal size. Adrenals/Urinary Tract: Tumor from the pancreatic tail appears to extend to and possibly involve the anterior to mid pole of the left kidney. No hydronephrosis. Right adrenal gland unremarkable. Left adrenal gland not visualized and likely encased by the pancreatic tumor. Urinary bladder unremarkable. Stomach/Bowel: Pancreatic tumor abuts the proximal descending thoracic aorta at the splenic flexure. No evidence of bowel obstruction. Vascular/Lymphatic: Aortic atherosclerosis. Large left periaortic lymph node with a short axis diameter of 3.3 cm. Other enlarged upper abdominal retroperitoneal lymph nodes. Reproductive: No visible focal abnormality. Other: Small amount of free fluid in the pelvis. Musculoskeletal: No acute bony abnormality. IMPRESSION: Large mass  involving the pancreatic tail measuring up to 10 cm with extension in the left retroperitoneum with possible involvement of the left kidney. Mass extends anterior to the spleen. Retroperitoneal adenopathy. Low-density lesions in the liver, likely metastases. Innumerable masses in the pulmonary bases compatible with metastases. Small left pleural effusion. Small amount of free fluid in the pelvis. Aortic atherosclerosis. Electronically Signed   By: Rolm Baptise M.D.   On: 08/09/2020 08:59   DG CHEST PORT 1 VIEW  Result Date: 08/31/2020 CLINICAL DATA:  Increased shortness of breath EXAM: PORTABLE CHEST 1 VIEW COMPARISON:  08/30/2020 FINDINGS: Patchy bilateral airspace disease and numerous pulmonary nodules again noted, unchanged. Heart is normal size. Right Port-A-Cath remains in place, unchanged. IMPRESSION: Patchy bilateral lung nodules and infiltrates. No significant change since prior study. Electronically Signed   By: Rolm Baptise M.D.   On: 08/31/2020 17:28   DG CHEST PORT 1 VIEW  Result Date: 08/30/2020 CLINICAL DATA:  Hypoxia EXAM: PORTABLE CHEST 1 VIEW COMPARISON:  08/29/2020 FINDINGS: Multiple pulmonary nodules as noted on CT. Bilateral airspace infiltrates right greater than left stable. No pneumothorax or pleural effusion. Heart size and vascularity normal. Port-A-Cath in the SVC. IMPRESSION: Stable chest x-ray. Bilateral lung nodules and bilateral infiltrates right greater than left. Electronically Signed   By: Franchot Gallo M.D.   On: 08/30/2020 08:07   DG Chest Port 1 View  Result Date: 08/29/2020 CLINICAL DATA:  Increasing shortness of breath EXAM: PORTABLE CHEST 1 VIEW COMPARISON:  Yesterday FINDINGS: Patchy right more than left pulmonary infiltrates with multiple nodules by chest CT. Normal heart size and stable mediastinal contours. Right port with tip at the  SVC. No visible effusion or pneumothorax. IMPRESSION: Stable pattern of pulmonary infiltrates and nodules. Electronically Signed    By: Monte Fantasia M.D.   On: 08/29/2020 05:13   DG Chest Port 1 View  Result Date: 08/22/2020 CLINICAL DATA:  Shortness of breath in a patient with a history of metastatic pancreatic cancer. EXAM: PORTABLE CHEST 1 VIEW COMPARISON:  Single-view of the chest 08/18/2020. FINDINGS: Port-A-Cath is unchanged. Multiple pulmonary nodules and masses are again seen. Coarsening of the pulmonary interstitium in the mid and lower lung zones is also again seen. No new airspace disease. No pneumothorax. Very small bilateral pleural effusions. No acute bony abnormality. IMPRESSION: No acute disease in patient with extensive metastatic pulmonary nodules. Electronically Signed   By: Inge Rise M.D.   On: 08/08/2020 13:35   DG CHEST PORT 1 VIEW  Result Date: 08/18/2020 CLINICAL DATA:  Worsening cough. EXAM: PORTABLE CHEST 1 VIEW COMPARISON:  08/13/2020 FINDINGS: 0841 hours. Peripheral airspace disease seen on the previous x-ray appears improved in the interval. There are persistent numeral bilateral pulmonary nodules with a mid and lower lung predominance. No substantial pleural effusion. Interstitial markings are diffusely coarsened with chronic features. Cardiopericardial silhouette is at upper limits of normal for size. Telemetry leads overlie the chest. IMPRESSION: 1. Interval improvement in peripheral airspace disease seen on the previous x-ray. 2. Persistent widespread bilateral pulmonary nodules compatible with metastatic disease. Electronically Signed   By: Misty Stanley M.D.   On: 08/18/2020 10:36   DG Chest Portable 1 View  Result Date: 08/09/2020 CLINICAL DATA:  75 year old male with shortness of breath. History of pancreatic tumor. EXAM: PORTABLE CHEST 1 VIEW COMPARISON:  No priors. FINDINGS: Widespread areas of interstitial prominence are noted throughout the lungs bilaterally, most evident in the mid to lower lungs, strongly suggestive of interstitial lung disease. Lung volumes are normal. In  addition, there are extensive areas of nodularity throughout the lungs bilaterally also most evident throughout the mid to lower lungs, concerning for widespread metastatic disease. Small left pleural effusion. No definite right pleural effusion. No pneumothorax. No evidence of pulmonary edema. Heart size is normal. Upper mediastinal contours are within normal limits. Orthopedic fixation hardware in the lower cervical spine incompletely imaged. IMPRESSION: 1. The appearance the chest suggest widespread metastatic disease to the lungs, superimposed upon a background of chronic interstitial lung disease, as above. 2. Small left pleural effusion. Electronically Signed   By: Vinnie Langton M.D.   On: 08/09/2020 07:16   ECHOCARDIOGRAM COMPLETE  Result Date: 08/09/2020    ECHOCARDIOGRAM REPORT   Patient Name:   PADRAIG NHAN Date of Exam: 08/09/2020 Medical Rec #:  [id number]                Height:       67.0 in Accession #:    [id number]               Weight:       140.7 lb Date of Birth:  15-Aug-1945                BSA:          1.741 m Patient Age:    75 years                 BP:           136/96 mmHg Patient Gender: M  HR:           111 bpm. Exam Location:  Inpatient Procedure: 2D Echo, Cardiac Doppler and Color Doppler Indications:    Dyspnea  History:        Patient has no prior history of Echocardiogram examinations.  Sonographer:    Merrie Roof RDCS Referring Phys: [place]  1. Left ventricular ejection fraction, by estimation, is 55 to 60%. The left ventricle has normal function. The left ventricle has no regional wall motion abnormalities. Left ventricular diastolic parameters are consistent with Grade I diastolic dysfunction (impaired relaxation).  2. Right ventricular systolic function is normal. The right ventricular size is normal. There is normal pulmonary artery systolic pressure.  3. The mitral valve is normal in structure. Trivial mitral  valve regurgitation. No evidence of mitral stenosis.  4. The aortic valve is tricuspid. There is mild calcification of the aortic valve. There is mild thickening of the aortic valve. Aortic valve regurgitation is not visualized. Mild to moderate aortic valve sclerosis/calcification is present, without any evidence of aortic stenosis.  5. The inferior vena cava is normal in size with greater than 50% respiratory variability, suggesting right atrial pressure of 3 mmHg. Comparison(s): No prior Echocardiogram. FINDINGS  Left Ventricle: Left ventricular ejection fraction, by estimation, is 55 to 60%. The left ventricle has normal function. The left ventricle has no regional wall motion abnormalities. The left ventricular internal cavity size was normal in size. There is  borderline concentric left ventricular hypertrophy. Left ventricular diastolic parameters are consistent with Grade I diastolic dysfunction (impaired relaxation). Normal left ventricular filling pressure. Right Ventricle: The right ventricular size is normal. No increase in right ventricular wall thickness. Right ventricular systolic function is normal. There is normal pulmonary artery systolic pressure. The tricuspid regurgitant velocity is 2.80 m/s, and  with an assumed right atrial pressure of 3 mmHg, the estimated right ventricular systolic pressure is 94.1 mmHg. Left Atrium: Left atrial size was normal in size. Right Atrium: Right atrial size was normal in size. Pericardium: There is no evidence of pericardial effusion. Mitral Valve: The mitral valve is normal in structure. There is mild thickening of the mitral valve leaflet(s). There is mild calcification of the mitral valve leaflet(s). Mild mitral annular calcification. Trivial mitral valve regurgitation. No evidence  of mitral valve stenosis. Tricuspid Valve: The tricuspid valve is normal in structure. Tricuspid valve regurgitation is trivial. Aortic Valve: The aortic valve is tricuspid. There  is mild calcification of the aortic valve. There is mild thickening of the aortic valve. Aortic valve regurgitation is not visualized. Mild to moderate aortic valve sclerosis/calcification is present, without any evidence of aortic stenosis. Pulmonic Valve: The pulmonic valve was normal in structure. Pulmonic valve regurgitation is trivial. Aorta: The aortic root and ascending aorta are structurally normal, with no evidence of dilitation. Venous: The inferior vena cava is normal in size with greater than 50% respiratory variability, suggesting right atrial pressure of 3 mmHg. IAS/Shunts: No atrial level shunt detected by color flow Doppler.  LEFT VENTRICLE PLAX 2D LVIDd:         2.97 cm  Diastology LVIDs:         2.18 cm  LV e' medial:  8.05 cm/s LV PW:         1.06 cm  LV e' lateral: 8.59 cm/s LV IVS:        0.87 cm LVOT diam:     1.90 cm LV SV:  66 LV SV Index:   38 LVOT Area:     2.84 cm  RIGHT VENTRICLE RV Basal diam:  3.60 cm LEFT ATRIUM             Index       RIGHT ATRIUM           Index LA diam:        2.80 cm 1.61 cm/m  RA Area:     16.40 cm LA Vol (A2C):   49.4 ml 28.37 ml/m RA Volume:   37.50 ml  21.54 ml/m LA Vol (A4C):   57.5 ml 33.02 ml/m LA Biplane Vol: 53.2 ml 30.55 ml/m  AORTIC VALVE LVOT Vmax:   154.00 cm/s LVOT Vmean:  113.000 cm/s LVOT VTI:    0.232 m  AORTA Ao Root diam: 3.10 cm Ao Asc diam:  3.50 cm TRICUSPID VALVE TR Peak grad:   31.4 mmHg TR Vmax:        280.00 cm/s  SHUNTS Systemic VTI:  0.23 m Systemic Diam: 1.90 cm Gwyndolyn Kaufman MD Electronically signed by Gwyndolyn Kaufman MD Signature Date/Time: 08/09/2020/5:00:32 PM    Final    [place] CORE BIOPSY (LYMPH NODES)  Result Date: 08/13/2020 INDICATION: Pancreatic mass, liver lesions, pulmonary masses and lymphadenopathy including palpable enlarged left supraclavicular lymph node. EXAM: ULTRASOUND GUIDED CORE BIOPSY OF LEFT SUPRACLAVICULAR LYMPH NODE MEDICATIONS: None. ANESTHESIA/SEDATION: Fentanyl 50 mcg IV; Versed 1.0 mg IV  Moderate Sedation Time:  10 minutes. The patient was continuously monitored during the procedure by the interventional radiology nurse under my direct supervision. PROCEDURE: The procedure, risks, benefits, and alternatives were explained to the patient. Questions regarding the procedure were encouraged and answered. The patient understands and consents to the procedure. A time-out was performed prior to initiating the procedure. The left neck was prepped with chlorhexidine in a sterile fashion, and a sterile drape was applied covering the operative field. A sterile gown and sterile gloves were used for the procedure. Local anesthesia was provided with 1% Lidocaine. After localizing abnormal enlarged left lower neck/supraclavicular lymph nodes, core biopsy samples were obtained with an 18 gauge core biopsy device. Four samples were obtained and submitted in formalin. COMPLICATIONS: None immediate. FINDINGS: Enlarged lymph node mass in the left supraclavicular region measures at least 5.2 cm in greatest diameter. Solid tissue was obtained. IMPRESSION: Ultrasound-guided core biopsy performed of enlarged lymph node mass in the left supraclavicular region. Electronically Signed   By: Aletta Edouard M.D.   On: 08/13/2020 09:19   IR IMAGING GUIDED PORT INSERTION  Result Date: 08/21/2020 INDICATION: 75 year old with metastatic pancreatic cancer. Port-A-Cath needed for therapy. EXAM: FLUOROSCOPIC AND ULTRASOUND GUIDED PLACEMENT OF A SUBCUTANEOUS PORT COMPARISON:  None. MEDICATIONS: Moderate sedation ANESTHESIA/SEDATION: Versed 2.0 mg IV; Fentanyl 100 mcg IV; Moderate Sedation Time:  26 minutes The patient was continuously monitored during the procedure by the interventional radiology nurse under my direct supervision. FLUOROSCOPY TIME:  54 seconds, 4 mGy COMPLICATIONS: None immediate. PROCEDURE: The procedure, risks, benefits, and alternatives were explained to the patient. Questions regarding the procedure were  encouraged and answered. The patient understands and consents to the procedure. Patient was placed supine on the interventional table. Ultrasound confirmed a patent right internal jugular vein. Ultrasound image was saved for documentation. The right chest and neck were cleaned with a skin antiseptic and a sterile drape was placed. Maximal barrier sterile technique was utilized including caps, mask, sterile gowns, sterile gloves, sterile drape, hand hygiene and skin antiseptic. The right neck was anesthetized  with 1% lidocaine. Small incision was made in the right neck with a blade. Micropuncture set was placed in the right internal jugular vein with ultrasound guidance. The micropuncture wire was used for measurement purposes. The right chest was anesthetized with 1% lidocaine with epinephrine. #15 blade was used to make an incision and a subcutaneous port pocket was formed. [place] was assembled. Subcutaneous tunnel was formed with a stiff tunneling device. The port catheter was brought through the subcutaneous tunnel. The port was placed in the subcutaneous pocket. The micropuncture set was exchanged for a peel-away sheath. The catheter was placed through the peel-away sheath and the tip was positioned at the superior cavoatrial junction. Catheter placement was confirmed with fluoroscopy. The port was accessed and flushed with saline. The port pocket was closed using two layers of absorbable sutures and Dermabond. The vein skin site was closed using a single layer of absorbable suture and Dermabond. Port-A-Cath was left accessed at the end of the procedure and flushed with normal saline. Sterile dressings were applied. Patient tolerated the procedure well without an immediate complication. Ultrasound and fluoroscopic images were taken and saved for this procedure. IMPRESSION: Placement of a subcutaneous port device. Catheter tip is positioned at the superior cavoatrial junction. Electronically Signed    By: Markus Daft M.D.   On: 08/21/2020 12:23   VAS [place] LOWER EXTREMITY VENOUS (DVT)  Result Date: 08/29/2020  Lower Venous DVT Study Patient Name:  SHAUNAK KREIS  Date of Exam:   08/29/2020 Medical Rec #: [id number]                 Accession #:    [id number] Date of Birth: [birth year]                 Patient Gender: M Patient Age:   75Y Exam Location:  [facility] Procedure:      VAS [place] LOWER EXTREMITY VENOUS (DVT) Referring Phys: GRACE BOWSER --------------------------------------------------------------------------------  Indications: SOB, history of metastatic cancer.  Comparison Study: No prior studies. Performing Technologist: Darlin Coco RDMS,RVT  Examination Guidelines: A complete evaluation includes B-mode imaging, spectral Doppler, color Doppler, and power Doppler as needed of all accessible portions of each vessel. Bilateral testing is considered an integral part of a complete examination. Limited examinations for reoccurring indications may be performed as noted. The reflux portion of the exam is performed with the patient in reverse Trendelenburg.  +---------+---------------+---------+-----------+----------+--------------+ RIGHT    CompressibilityPhasicitySpontaneityPropertiesThrombus Aging +---------+---------------+---------+-----------+----------+--------------+ CFV      Full           Yes      Yes                                 +---------+---------------+---------+-----------+----------+--------------+ SFJ      Full                                                        +---------+---------------+---------+-----------+----------+--------------+ FV Prox  Full                                                        +---------+---------------+---------+-----------+----------+--------------+  FV Mid   Full                                                        +---------+---------------+---------+-----------+----------+--------------+ FV DistalFull                                                         +---------+---------------+---------+-----------+----------+--------------+ PFV      Full                                                        +---------+---------------+---------+-----------+----------+--------------+ POP      Full           Yes      Yes                                 +---------+---------------+---------+-----------+----------+--------------+ PTV      Full                                                        +---------+---------------+---------+-----------+----------+--------------+ PERO     Full                                                        +---------+---------------+---------+-----------+----------+--------------+   +---------+---------------+---------+-----------+----------+--------------+ LEFT     CompressibilityPhasicitySpontaneityPropertiesThrombus Aging +---------+---------------+---------+-----------+----------+--------------+ CFV      Full           Yes      Yes                                 +---------+---------------+---------+-----------+----------+--------------+ SFJ      Full                                                        +---------+---------------+---------+-----------+----------+--------------+ FV Prox  Full                                                        +---------+---------------+---------+-----------+----------+--------------+ FV Mid   Full                                                        +---------+---------------+---------+-----------+----------+--------------+  FV DistalFull                                                        +---------+---------------+---------+-----------+----------+--------------+ PFV      Full                                                        +---------+---------------+---------+-----------+----------+--------------+ POP      Full           Yes      Yes                                  +---------+---------------+---------+-----------+----------+--------------+ PTV      Full                                                        +---------+---------------+---------+-----------+----------+--------------+ PERO     Full                                                        +---------+---------------+---------+-----------+----------+--------------+     Summary: RIGHT: - There is no evidence of deep vein thrombosis in the lower extremity.  - No cystic structure found in the popliteal fossa.  LEFT: - There is no evidence of deep vein thrombosis in the lower extremity.  - No cystic structure found in the popliteal fossa.  *See table(s) above for measurements and observations. Electronically signed by Servando Snare MD on 08/29/2020 at 3:49:51 PM.    Final    ECHOCARDIOGRAM LIMITED  Result Date: 08/29/2020    ECHOCARDIOGRAM LIMITED REPORT   Patient Name:   KEIONDRE COLEE Date of Exam: 08/29/2020 Medical Rec #:  [id number]                Height:       67.0 in Accession #:    [id number]               Weight:       129.6 lb Date of Birth:  30-Nov-1945                BSA:          1.682 m Patient Age:    70 years                 BP:           121/75 mmHg Patient Gender: M                        HR:           170 bpm. Exam Location:  Inpatient Procedure: Limited Echo, Color Doppler and Cardiac Doppler Indications:    I48.91* Unspecified atrial fibrillation  History:  Patient has prior history of Echocardiogram examinations, most                 recent 08/09/2020. COPD; Risk Factors:Hypertension, Dyslipidemia                 and Sleep Apnea.  Sonographer:    Raquel Sarna Senior RDCS Referring Phys: [id number] Sharion Dove DANIELS  Sonographer Comments: Heart rate has been significantly elevated since admission per RN. Echo performed to rule out structural cause. IMPRESSIONS  1. Left ventricular ejection fraction, by estimation, is 60 to 65%. The left ventricle has normal function. The left  ventricle has no regional wall motion abnormalities. Left ventricular diastolic parameters are indeterminate.  2. Right ventricular systolic function is normal. The right ventricular size is normal. There is normal pulmonary artery systolic pressure. The estimated right ventricular systolic pressure is 61.6 mmHg.  3. The mitral valve is normal in structure. Trivial mitral valve regurgitation. No evidence of mitral stenosis.  4. The aortic valve is tricuspid. Aortic valve regurgitation is not visualized. Mild aortic valve sclerosis is present, with no evidence of aortic valve stenosis.  5. The inferior vena cava is normal in size with greater than 50% respiratory variability, suggesting right atrial pressure of 3 mmHg.  6. The patient was in atrial fibrillation. FINDINGS  Left Ventricle: Left ventricular ejection fraction, by estimation, is 60 to 65%. The left ventricle has normal function. The left ventricle has no regional wall motion abnormalities. The left ventricular internal cavity size was normal in size. There is  no left ventricular hypertrophy. Left ventricular diastolic parameters are indeterminate. Right Ventricle: The right ventricular size is normal. No increase in right ventricular wall thickness. Right ventricular systolic function is normal. There is normal pulmonary artery systolic pressure. The tricuspid regurgitant velocity is 2.69 m/s, and  with an assumed right atrial pressure of 3 mmHg, the estimated right ventricular systolic pressure is 83.7 mmHg. Left Atrium: Left atrial size was normal in size. Right Atrium: Right atrial size was normal in size. Pericardium: There is no evidence of pericardial effusion. Mitral Valve: The mitral valve is normal in structure. Mild mitral annular calcification. Trivial mitral valve regurgitation. No evidence of mitral valve stenosis. Tricuspid Valve: The tricuspid valve is normal in structure. Tricuspid valve regurgitation is mild. Aortic Valve: The aortic  valve is tricuspid. Aortic valve regurgitation is not visualized. Mild aortic valve sclerosis is present, with no evidence of aortic valve stenosis. Venous: The inferior vena cava is normal in size with greater than 50% respiratory variability, suggesting right atrial pressure of 3 mmHg. IAS/Shunts: No atrial level shunt detected by color flow Doppler. AORTIC VALVE LVOT Vmax:   119.50 cm/s LVOT Vmean:  76.867 cm/s LVOT VTI:    0.165 m TRICUSPID VALVE TR Peak grad:   28.9 mmHg TR Vmax:        269.00 cm/s  SHUNTS Systemic VTI: 0.17 m Loralie Champagne MD Electronically signed by Loralie Champagne MD Signature Date/Time: 08/29/2020/4:59:05 PM    Final

## 2020-09-01 NOTE — Progress Notes (Signed)
Progress Note  Patient Name: Grant Taylor Date of Encounter: 09/01/2020  [facility] Cardiologist: Elouise Munroe, MD   Subjective   No CP, continues to require BiPap intermittently due to fatiguing on HFNC.   Inpatient Medications    Scheduled Meds:  budesonide (PULMICORT) nebulizer solution  0.5 mg Nebulization BID   buPROPion  300 mg Oral Daily   chlorhexidine  15 mL Mouth Rinse BID   Chlorhexidine Gluconate Cloth  6 each Topical Daily   diltiazem  240 mg Oral Daily   dronabinol  2.5 mg Oral BID AC   enoxaparin (LOVENOX) injection  40 mg Subcutaneous Q24H   etoposide  96 mg/m2 (Treatment Plan Recorded) Intravenous Once   feeding supplement  237 mL Oral TID BM   gabapentin  300 mg Oral TID   guaiFENesin  1,200 mg Oral BID   insulin aspart  0-15 Units Subcutaneous TID WC   insulin glargine-yfgn  10 Units Subcutaneous Daily   ipratropium  0.5 mg Nebulization TID   levalbuterol  0.63 mg Nebulization TID   loratadine  10 mg Oral Daily   mouth rinse  15 mL Mouth Rinse q12n4p   methylPREDNISolone (SOLU-MEDROL) injection  80 mg Intravenous Q6H   multivitamin with minerals  1 tablet Oral Daily   pantoprazole  40 mg Oral Daily   polyethylene glycol  17 g Oral Daily   sodium chloride flush  10-40 mL Intracatheter Q12H   sodium chloride flush  3 mL Intravenous Q12H   Continuous Infusions:  sodium chloride     amiodarone 30 mg/hr (09/01/20 0438)   ampicillin-sulbactam (UNASYN) IV Stopped (09/01/20 0417)   PRN Meds: acetaminophen, albuterol, benzonatate, docusate sodium, fluticasone, guaiFENesin-codeine, hydrALAZINE, hydrOXYzine, morphine injection, nitroGLYCERIN, ondansetron (ZOFRAN) IV, oxyCODONE, polyvinyl alcohol, sodium chloride, sodium chloride flush, tiZANidine   Vital Signs    Vitals:   09/01/20 0500 09/01/20 0600 09/01/20 0720 09/01/20 0721  BP: (!) 156/102 (!) 164/99    Pulse: 76 80    Resp: 13 17    Temp:      TempSrc:      SpO2: 97% 97% 97%  97%  Weight:      Height:        Intake/Output Summary (Last 24 hours) at 09/01/2020 0807 Last data filed at 09/01/2020 0500 Gross per 24 hour  Intake 1319 ml  Output 2550 ml  Net -1231 ml   Last 3 Weights 08/29/2020 08/27/2020 08/21/2020  Weight (lbs) 129 lb 10.1 oz 129 lb 4.8 oz 135 lb  Weight (kg) 58.8 kg 58.65 kg 61.236 kg      Telemetry   Normal sinus rhythm with rates in the 80s. - Personally Reviewed  ECG  8/5 - SR, short PR, . - Personally Reviewed  Physical Exam   GEN: No acute distress.   Neck: No JVD Cardiac: RRR, no murmurs, rubs, or gallops.  Respiratory: Clear to auscultation bilaterally. GI: Soft, nontender, non-distended  MS: No edema; No deformity. Neuro:  Nonfocal  Psych: Normal affect    Labs    High Sensitivity Troponin:   Recent Labs  Lab 08/09/20 0636 08/09/20 1002 07/27/2020 1225 08/11/2020 1545 08/29/20 0819  TROPONINIHS 10 10 16  18* 11      Chemistry Recent Labs  Lab 08/30/20 0255 08/31/20 0400 09/01/20 0500  NA 142 142 140  K 3.6 4.2 3.8  CL 106 105 104  CO2 21* 28 28  GLUCOSE 135* 154* 139*  BUN 29* 28* 27*  CREATININE 1.08 0.92  0.92  CALCIUM 8.8* 8.8* 8.5*  PROT 6.1* 6.2* 6.1*  ALBUMIN 2.3* 2.3* 2.2*  AST 69* 99* 96*  ALT 56* 70* 68*  ALKPHOS 97 100 90  BILITOT 0.3 0.3 0.5  GFRNONAA >60 >60 >60  ANIONGAP 15 9 8      Hematology Recent Labs  Lab 08/30/20 0255 08/31/20 0400 09/01/20 0500  WBC 17.8* 22.4* 24.2*  RBC 4.22 4.31 4.33  HGB 11.0* 11.5* 11.4*  HCT 35.1* 36.3* 36.2*  MCV 83.2 84.2 83.6  MCH 26.1 26.7 26.3  MCHC 31.3 31.7 31.5  RDW 17.5* 17.5* 17.8*  PLT 339 340 269    BNP Recent Labs  Lab 08/29/20 0430  BNP 219.4*     DDimer  Recent Labs  Lab 08/29/20 0400  DDIMER 7.68*     Radiology    DG CHEST PORT 1 VIEW  Result Date: 08/31/2020 CLINICAL DATA:  Increased shortness of breath EXAM: PORTABLE CHEST 1 VIEW COMPARISON:  08/30/2020 FINDINGS: Patchy bilateral airspace disease and numerous  pulmonary nodules again noted, unchanged. Heart is normal size. Right Port-A-Cath remains in place, unchanged. IMPRESSION: Patchy bilateral lung nodules and infiltrates. No significant change since prior study. Electronically Signed   By: Rolm Baptise M.D.   On: 08/31/2020 17:28    Cardiac Studies   Echocardiogram 08/29/2020: Impressions: 1. Left ventricular ejection fraction, by estimation, is 60 to 65%. The  left ventricle has normal function. The left ventricle has no regional  wall motion abnormalities. Left ventricular diastolic parameters are  indeterminate.   2. Right ventricular systolic function is normal. The right ventricular  size is normal. There is normal pulmonary artery systolic pressure. The  estimated right ventricular systolic pressure is 93.7 mmHg.   3. The mitral valve is normal in structure. Trivial mitral valve  regurgitation. No evidence of mitral stenosis.   4. The aortic valve is tricuspid. Aortic valve regurgitation is not  visualized. Mild aortic valve sclerosis is present, with no evidence of  aortic valve stenosis.   5. The inferior vena cava is normal in size with greater than 50%  respiratory variability, suggesting right atrial pressure of 3 mmHg.   6. The patient was in atrial fibrillation.    Patient Profile     75 y.o. male with a history of COPD, obstructive sleep apnea, hypertension, hyperlipidemia, GERD, prior tobacco use (quit 5-6 years ago), and recently diagnosed metastatic cancer who was admitted on 07/31/2020 for acute hypoxic respiratory failure secondary to pneumonia and metastatic cancer. Cardiology consulted on 08/29/2020 for evaluation of  is being seen 08/29/2020 for the evaluation of new onset atrial fibrillation with RVR at the request of Dr. Grandville Silos.  Assessment & Plan    New Onset Atrial Fibrillation with RVR - now in sinus, continuing amiodarone IV through initial chemotherapy, chemo started yesterday. - Echo showed normal LVEF of  60-65%. - CHA2DS2-VASc = 3 (HTN and age x2). After converting to SR, AC has been held due to hemoptysis. May warrant review prior to hospital dismissal. On sq prophylactic lovenox dosing for DVT prevention.  Acute Hypoxic Respiratory Failure - waxing and waning, per nurse Liane Comber he is requiring Bipap frequently.  - he is net negative 6 liters. We could consider trying one dose of IV lasix, but I am not as suspicious for decompensated HF as the source of his hypoxia, suspect primarily pulmonary process.  - Management per primary team.  Chest Pain - resolved.  Hypertension - BP mildly elevated - on oral diltiazem - did  not receive yesterday. Please give diltiazem 240 mg today and see how he responds from BP standpoint.    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  - COPD - Severe protein-calorie malnutrition  For questions or updates, please contact [facility] Please consult [email or web address] for contact info under     Elouise Munroe, MD 09/01/20 8:07 AM

## 2020-09-01 NOTE — Progress Notes (Signed)
PROGRESS NOTE    Grant Taylor  OJJ:[id number] DOB: 25-Apr-1945 DOA: 07/26/2020 PCP: [name], MD   Chief Complaint  Patient presents with   Shortness of Breath    Brief Narrative:  75 year old with past medical history significant for hypertension, COPD, HLD, GERD, BPH, OSA who presents with severe cough, inability to eat, abdominal pain, previous diarrhea.  He was recently discharged with plans to follow-up at the [facility] however, he was not doing well at home with the severe cough.  His oxygen requirement increased at home.  He reports nausea. 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   Patient presented with worsening hypoxic respiratory failure, shortness of breath.  CT angio showed interval development of patchy consolidative opacities throughout the right lung which may represent superimposed infectious process or potentially lymphangitic spread of tumor.  Negative for PE.  Bulky mediastinal adenopathy narrows the right lower lobe pulmonary artery.  Interval increase in size of bulky mediastinal and hilar adenopathy as well as multiple bilateral pulmonary metastases.  Redemonstrated partially visualized large pancreatic mass. -Patient placed on IV antibiotics, oncology consulted patient was to start chemo however patient went to A. fib with RVR on 08/29/2020 transferred to the ICU placed on Cardizem drip and subsequently transition to amiodarone drip.  PCCM, cardiology consulted. -Patient subsequently converted to normal sinus rhythm, maintained on amiodarone drip with initiation of chemotherapy which was started 08/31/2020.  Patient on BiPAP nightly.     Assessment & Plan:   Principal Problem:   Acute on chronic respiratory failure with hypoxia  (HCC) Active Problems:   New onset a-fib (HCC)   Essential hypertension   COPD (chronic obstructive pulmonary disease) (HCC)   Pancreatic mass   Pneumonia   Protein-calorie malnutrition, severe   Small cell carcinoma (HCC)   Leukocytosis   Depression  #1 acute hypoxic respiratory failure likely multifactorial secondary to pneumonia in the setting of worsening metastatic cancer/lymphangitic spread of cancer/COPD -Patient with increasing O2 requirements up to 14 L high flow nasal cannula and nonrebreather the morning of 08/29/2020 and noted to have gone into A. fib with RVR.  -Patient also noted to have gone into A. fib with RVR which likely also contributing to worsening respiratory distress. -Patient back in sinus rhythm and noted to be on BiPAP throughout the day yesterday and at bedtime.   -Patient chronically on BiPAP at bedtime prior to admission. -Patient currently on 15 L high flow nasal cannula. -Urine strep pneumococcus antigen negative.  Urine Legionella antigen negative.  MRSA PCR negative. -Sputum with rare Enterococcus faecalis, no staph isolated. -Procalcitonin noted to be borderline. -ABG the morning of 08/29/2020, with a pH of 7.491/PCO2 of 29/PO2 of 63/bicarb of 22. -IV cefepime, IV azithromycin, IV Flagyl, IV vancomycin have been discontinued.  -Continue IV Unasyn . -Continue scheduled nebs, Mucinex, Claritin, PPI, IV Solu-Medrol, Pulmicort nebs.  -Decrease IV Solu-Medrol to 80 mg IV every 8 hours. -Patient seen in consultation and being followed by oncology Dr. Marin Olp who discussed with patient's oncologist from [facility] and decision made to start inpatient chemotherapy which was to be started on 08/29/2020, however was postponed as patient had gone into A. fib with RVR.   -Patient currently in sinus rhythm.  -Status post 40 mg IV Lasix x1 with good urine output . -Patient with urine output of 2.550 L  over the past 24 hours.   -Currently in the ICU.   -Patient states feeling  much better than he did yesterday.  -Patient was assessed by PCCM who have signed off as of 08/30/2020. -Patient was started on chemotherapy 08/31/2020 which she seems to have tolerated.   -Oncology following.     2.  New onset A. fib with RVR CHA2DS2VASC score 3 -Patient noted to go into A. fib with RVR the morning of 08/29/2020 with heart rates close into the 200s.   -Likely secondary to acute pulmonary issues as in problem #1. -Cardiac enzymes negative.   -Patient with recent 2D echo (08/09/2020) with a EF of 55 to 19%,FXTK, grade 1 diastolic dysfunction, normal right ventricular systolic function, normal pulmonary artery systolic pressure, normal mitral valve structure, no mitral stenosis, trivial mitral valve regurgitation, tricuspid aortic valve, mild to moderate aortic valve sclerosis/calcification is present without any evidence of aortic stenosis. -Repeat 2D echo done 08/29/2020 with EF 60 to 65%,NWMA, normal mitral valve, no evidence of mitral stenosis, normal left atrial size. -Patient was on a Cardizem drip and amiodarone drip and subsequently converted back to normal sinus rhythm. -TSH within normal limits -Cardizem drip has been transitioned to oral Cardizem and dose being adjusted per cardiology. -Currently on amiodarone drip which will be continued through initial chemotherapy per cardiology recommendations.   -Chemotherapy started yesterday 08/31/2020.  -PCCM discontinued IV heparin due to hemoptysis and patient currently on prophylactic dose Lovenox. -Per cardiology may need to reassess need for anticoagulation close to discharge.  -Cardiology following and appreciate input and recommendations  3.  Hypertension -BP initially noted to be soft and as such amlodipine initially held and was resumed on 08/28/2020 and subsequently discontinued on 08/29/2020 as patient noted to be in A. fib and was on a Cardizem and amiodarone drip. -Oral Cardizem dose increased to 240 mg daily per cardiology and  patient on amiodarone drip.   -Follow.    4.  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 -Patient being followed by oncology was to be started on chemotherapy on 08/29/2020 however was postponed as patient had gone into A. fib with RVR.   -Patient back in sinus rhythm and currently on amiodarone.   -Patient status post initiation of chemotherapy yesterday 08/31/2020.  -Patient seems to have tolerated chemotherapy okay.   -Per oncology.    5.  Depression -Wellbutrin, hydroxyzine for anxiety.    6.  Moderate protein calorie malnutrition in the setting of malignancy -Continue dronabinol -Continue nutritional supplementation with Ensure.  7.  Hyperkalemia -Status post 1 dose IV Lasix.   -Potassium at 3.8.   -Follow.    8.  Leukocytosis -Patient on IV steroids likely contributing to leukocytosis. -Leukocytosis fluctuating currently at 24.2 from 22.4 from 17.8 from 20.3 from 22 from 16.4 on admission . -Continue IV antibiotics secondary to problem #1.    9.  Hyperglycemia -Secondary to IV steroids. -CBG 137 this morning.   -Continue Lantus 10 units daily, SSI.     DVT prophylaxis: Lovenox Code Status: Full Family Communication: Updated patient.  No family at bedside. Disposition:   Status is: Inpatient  Remains inpatient appropriate because:Inpatient level of care appropriate due to severity of illness  Dispo: The patient is from: Home              Anticipated d/c is to: TBD  Patient currently is not medically stable to d/c.   Difficult to place patient No       Consultants:  Oncology: Dr. Marin Olp PCCM: Dr. Chase Caller 08/29/2020 Cardiology: Dr.Acharya 08/29/2020  Procedures:  CT angiogram chest 08/18/2020 Chest x-ray 08/04/2020, 08/28/2020, 08/29/2020 2D echo  08/29/2020  Antimicrobials:  IV Unasyn 08/28/2020>>>>> IV azithromycin 08/26/2020>>>>> 08/28/2020 IV cefepime 08/07/2020>>>>> 08/28/2020 IV Flagyl 08/26/2020>>>> 08/28/2020 IV vancomycin 07/27/2020>>>> 08/26/2020   Subjective: On 15 L high flow nasal cannula.  Noted to have been on BiPAP throughout the day yesterday and last night.   -Patient states he is feeling significantly better today than he did yesterday.   -Feels shortness of breath is improving.   -No chest pain, no abdominal pain.   -In normal sinus rhythm  Objective: Vitals:   09/01/20 0700 09/01/20 0720 09/01/20 0721 09/01/20 0800  BP: (!) 169/100   (!) 148/92  Pulse: 77   80  Resp: 18   14  Temp:    97.8 F (36.6 C)  TempSrc:    Oral  SpO2: 97% 97% 97% 91%  Weight:      Height:        Intake/Output Summary (Last 24 hours) at 09/01/2020 1010 Last data filed at 09/01/2020 0500 Gross per 24 hour  Intake 1319 ml  Output 2550 ml  Net -1231 ml    Filed Weights   08/05/2020 1952 08/27/20 1559 08/29/20 0630  Weight: 61.2 kg 58.7 kg 58.8 kg    Examination:  General exam: On 15 L high flow nasal cannula.  Left supraclavicular lymph node Respiratory system: Decreasing diffuse coarse breath sounds.  No wheezing.  Fair air movement.  On 15 L high flow nasal cannula.  Cardiovascular system: RRR no murmurs rubs or gallops.  No JVD.  No lower extremity edema.   Gastrointestinal system: Abdomen is soft, nontender, nondistended, positive bowel sounds.  No rebound.  No guarding.  Central nervous system: Alert and oriented.  Moving extremities spontaneously.  No focal neurological deficits.   Extremities: No clubbing cyanosis or edema. Skin: No rashes, lesions or ulcers Psychiatry: Judgement and insight appear normal. Mood & affect appropriate.     Data Reviewed: I have personally reviewed following labs and imaging studies  CBC: Recent Labs  Lab 08/28/20 0919 08/29/20 0420 08/30/20 0255 08/31/20 0400 09/01/20 0500  WBC 22.0*  20.3* 17.8* 22.4* 24.2*  NEUTROABS 21.0* 19.1* 16.4* 21.2* 23.2*  HGB 11.1* 11.1* 11.0* 11.5* 11.4*  HCT 35.1* 34.6* 35.1* 36.3* 36.2*  MCV 84.0 83.2 83.2 84.2 83.6  PLT 368 364 339 340 269     Basic Metabolic Panel: Recent Labs  Lab 08/28/20 0919 08/29/20 0420 08/30/20 0255 08/31/20 0400 09/01/20 0500  NA 138 137 142 142 140  K 4.0 4.4 3.6 4.2 3.8  CL 99 101 106 105 104  CO2 26 26 21* 28 28  GLUCOSE 173* 160* 135* 154* 139*  BUN 19 23 29* 28* 27*  CREATININE 0.94 1.01 1.08 0.92 0.92  CALCIUM 9.5 9.2 8.8* 8.8* 8.5*  MG 2.0 2.3 2.3  --  2.4  PHOS  --  3.5  --   --   --      GFR: Estimated Creatinine Clearance: 56.8 mL/min (by C-G formula based on SCr of 0.92 mg/dL).  Liver Function Tests: Recent Labs  Lab 08/29/20 0420 08/30/20 0255 08/31/20 0400 09/01/20 0500  AST 49* 69* 99* 96*  ALT 56* 56* 70* 68*  ALKPHOS 104 97 100 90  BILITOT 0.4 0.3 0.3  0.5  PROT 6.7 6.1* 6.2* 6.1*  ALBUMIN 2.4* 2.3* 2.3* 2.2*     CBG: Recent Labs  Lab 08/31/20 0828 08/31/20 1219 08/31/20 1546 08/31/20 2027 09/01/20 0803  GLUCAP 152* 120* 135* 107* 137*      Recent Results (from the past 240 hour(s))  Resp Panel by RT-PCR (Flu A&B, Covid) Nasopharyngeal Swab     Status: None   Collection Time: 08/01/2020  1:34 PM   Specimen: Nasopharyngeal Swab; Nasopharyngeal(NP) swabs in vial transport medium  Result Value Ref Range Status   SARS Coronavirus 2 by RT PCR NEGATIVE NEGATIVE Final    Comment: (NOTE) SARS-CoV-2 target nucleic acids are NOT DETECTED.  The SARS-CoV-2 RNA is generally detectable in upper respiratory specimens during the acute phase of infection. The lowest concentration of SARS-CoV-2 viral copies this assay can detect is 138 copies/mL. A negative result does not preclude SARS-Cov-2 infection and should not be used as the sole basis for treatment or other patient management decisions. A negative result may occur with  improper specimen collection/handling,  submission of specimen other than nasopharyngeal swab, presence of viral mutation(s) within the areas targeted by this assay, and inadequate number of viral copies(<138 copies/mL). A negative result must be combined with clinical observations, patient history, and epidemiological information. The expected result is Negative.  Fact Sheet for Patients:  EntrepreneurPulse.com.au  Fact Sheet for Healthcare Providers:  IncredibleEmployment.be  This test is no t yet approved or cleared by the [place] FDA and  has been authorized for detection and/or diagnosis of SARS-CoV-2 by FDA under an Emergency Use Authorization (EUA). This EUA will remain  in effect (meaning this test can be used) for the duration of the COVID-19 declaration under Section 564(b)(1) of the Act, 21 U.S.C.section 360bbb-3(b)(1), unless the authorization is terminated  or revoked sooner.       Influenza A by PCR NEGATIVE NEGATIVE Final   Influenza B by PCR NEGATIVE NEGATIVE Final    Comment: (NOTE) The Xpert Xpress SARS-CoV-2/FLU/RSV plus assay is intended as an aid in the diagnosis of influenza from Nasopharyngeal swab specimens and should not be used as a sole basis for treatment. Nasal washings and aspirates are unacceptable for Xpert Xpress SARS-CoV-2/FLU/RSV testing.  Fact Sheet for Patients: EntrepreneurPulse.com.au  Fact Sheet for Healthcare Providers: IncredibleEmployment.be  This test is not yet approved or cleared by the [place] FDA and has been authorized for detection and/or diagnosis of SARS-CoV-2 by FDA under an Emergency Use Authorization (EUA). This EUA will remain in effect (meaning this test can be used) for the duration of the COVID-19 declaration under Section 564(b)(1) of the Act, 21 U.S.C. section 360bbb-3(b)(1), unless the authorization is terminated or revoked.  Performed at [facility], [place] [address] [place], [place] [address]   Blood Culture (routine x 2)     Status: None   Collection Time: 08/20/2020  1:34 PM   Specimen: BLOOD RIGHT FOREARM  Result Value Ref Range Status   Specimen Description BLOOD RIGHT FOREARM  Final   Special Requests   Final    BOTTLES DRAWN AEROBIC AND ANAEROBIC Blood Culture adequate volume   Culture   Final    NO GROWTH 5 DAYS Performed at [facility], [place] [address], [place], [place] [address]    Report Status 08/29/2020 FINAL  Final  Blood Culture (routine x 2)     Status: None   Collection Time: 08/21/2020  6:10 PM   Specimen: BLOOD  Result Value  Ref Range Status   Specimen Description BLOOD PORTA CATH  Final   Special Requests   Final    BOTTLES DRAWN AEROBIC AND ANAEROBIC Blood Culture adequate volume   Culture   Final    NO GROWTH 5 DAYS Performed at [facility], [address], [place], [place] [address]    Report Status 08/29/2020 FINAL  Final  MRSA Next Gen by PCR, Nasal     Status: None   Collection Time: 08/25/20  8:38 AM   Specimen: Nasal Mucosa; Nasal Swab  Result Value Ref Range Status   MRSA by PCR Next Gen NOT DETECTED NOT DETECTED Final    Comment: (NOTE) The GeneXpert MRSA Assay (FDA approved for NASAL specimens only), is one component of a comprehensive MRSA colonization surveillance program. It is not intended to diagnose MRSA infection nor to guide or monitor treatment for MRSA infections. Test performance is not FDA approved in patients less than 42 years old. Performed at [facility], [place] [address], [place], [place] [address]   Expectorated Sputum Assessment w Gram Stain, Rflx to Resp Cult     Status: None   Collection Time: 08/25/20 10:08 AM   Specimen: Expectorated Sputum  Result Value Ref Range Status   Specimen Description EXPECTORATED SPUTUM  Final   Special Requests NONE  Final   Sputum evaluation   Final    THIS SPECIMEN IS ACCEPTABLE FOR SPUTUM CULTURE Performed at [facility], [place] [address], [place], [place] [address]    Report Status 08/26/2020 FINAL  Final  Culture, Respiratory w Gram Stain     Status: None   Collection Time: 08/25/20 10:08 AM  Result Value Ref Range Status   Specimen Description EXPECTORATED SPUTUM  Final   Special Requests NONE Reflexed from [id number]  Final   Gram Stain   Final    RARE WBC PRESENT,BOTH PMN AND MONONUCLEAR RARE GRAM POSITIVE COCCI IN PAIRS    Culture   Final    RARE ENTEROCOCCUS FAECALIS NO STAPHYLOCOCCUS AUREUS ISOLATED No Pseudomonas species isolated Performed at [facility], [place] [address] [place], [place] [address]    Report Status 08/28/2020 FINAL  Final   Organism ID, Bacteria ENTEROCOCCUS FAECALIS  Final      Susceptibility   Enterococcus faecalis - MIC*    AMPICILLIN <=2 SENSITIVE Sensitive     VANCOMYCIN 1 SENSITIVE Sensitive     GENTAMICIN SYNERGY SENSITIVE Sensitive     * RARE ENTEROCOCCUS FAECALIS          Radiology Studies: DG CHEST PORT 1 VIEW  Result Date: 08/31/2020 CLINICAL DATA:  Increased shortness of breath EXAM: PORTABLE CHEST 1 VIEW COMPARISON:  08/30/2020 FINDINGS: Patchy bilateral airspace disease and numerous pulmonary nodules again noted, unchanged. Heart is normal size. Right Port-A-Cath remains in place, unchanged. IMPRESSION: Patchy bilateral lung nodules and infiltrates. No significant change since prior study. Electronically Signed   By: Rolm Baptise M.D.   On: 08/31/2020 17:28        Scheduled Meds:  budesonide (PULMICORT) nebulizer solution  0.5 mg Nebulization BID   buPROPion  300 mg Oral Daily   chlorhexidine  15 mL Mouth Rinse BID   Chlorhexidine Gluconate Cloth  6 each Topical Daily   diltiazem  240 mg Oral Daily   dronabinol  2.5 mg Oral BID AC   enoxaparin (LOVENOX) injection  40 mg Subcutaneous Q24H   etoposide  96 mg/m2 (Treatment Plan Recorded) Intravenous Once  feeding supplement  237 mL Oral TID BM   gabapentin  300 mg Oral TID   guaiFENesin   1,200 mg Oral BID   insulin aspart  0-15 Units Subcutaneous TID WC   insulin glargine-yfgn  10 Units Subcutaneous Daily   ipratropium  0.5 mg Nebulization TID   levalbuterol  0.63 mg Nebulization TID   loratadine  10 mg Oral Daily   mouth rinse  15 mL Mouth Rinse q12n4p   methylPREDNISolone (SOLU-MEDROL) injection  80 mg Intravenous Q6H   multivitamin with minerals  1 tablet Oral Daily   pantoprazole  40 mg Oral Daily   polyethylene glycol  17 g Oral Daily   sodium chloride flush  10-40 mL Intracatheter Q12H   sodium chloride flush  3 mL Intravenous Q12H   Continuous Infusions:  sodium chloride     amiodarone 30 mg/hr (09/01/20 0438)   ampicillin-sulbactam (UNASYN) IV Stopped (09/01/20 0417)     LOS: 8 days    Time spent: 45 minutes    Irine Seal, MD [facility]   To contact the attending provider between 7A-7P or the covering provider during after hours 7P-7A, please log into the web site [email or web address] and access using universal [place] password for that web site. If you do not have the password, please call the hospital operator.  09/01/2020, 10:10 AM

## 2020-09-01 NOTE — Progress Notes (Signed)
PT Cancellation Note  Patient Details Name: Grant Taylor MRN: [id number] DOB: Jul 11, 1945   Cancelled Treatment:     PT eval attempted but deferred at request of RN 2* pt exhaustion.  Will follow.   Leyana Whidden 09/01/2020, 12:40 PM

## 2020-09-02 DIAGNOSIS — J9621 Acute and chronic respiratory failure with hypoxia: Secondary | ICD-10-CM | POA: Diagnosis not present

## 2020-09-02 LAB — CBC WITH DIFFERENTIAL/PLATELET
Band Neutrophils: 1 %
Basophils Relative: 0 %
Blasts: NONE SEEN %
Eosinophils Relative: 0 %
HCT: 36 % — ABNORMAL LOW (ref 39.0–52.0)
Hemoglobin: 11.3 g/dL — ABNORMAL LOW (ref 13.0–17.0)
Lymphocytes Relative: 2 %
MCH: 26.4 pg (ref 26.0–34.0)
MCHC: 31.4 g/dL (ref 30.0–36.0)
MCV: 84.1 fL (ref 80.0–100.0)
Metamyelocytes Relative: NONE SEEN %
Monocytes Relative: 0 %
Myelocytes: NONE SEEN %
Neutrophils Relative %: 97 %
Platelets: 258 10*3/uL (ref 150–400)
Promyelocytes Relative: NONE SEEN %
RBC Morphology: NORMAL
RBC: 4.28 MIL/uL (ref 4.22–5.81)
RDW: 18.2 % — ABNORMAL HIGH (ref 11.5–15.5)
WBC Morphology: NORMAL
WBC: 26.4 10*3/uL — ABNORMAL HIGH (ref 4.0–10.5)
nRBC: 0 % (ref 0.0–0.2)
nRBC: NONE SEEN /100 WBC

## 2020-09-02 LAB — COMPREHENSIVE METABOLIC PANEL
ALT: 65 U/L — ABNORMAL HIGH (ref 0–44)
AST: 94 U/L — ABNORMAL HIGH (ref 15–41)
Albumin: 2 g/dL — ABNORMAL LOW (ref 3.5–5.0)
Alkaline Phosphatase: 84 U/L (ref 38–126)
Anion gap: 9 (ref 5–15)
BUN: 29 mg/dL — ABNORMAL HIGH (ref 8–23)
CO2: 28 mmol/L (ref 22–32)
Calcium: 8.9 mg/dL (ref 8.9–10.3)
Chloride: 104 mmol/L (ref 98–111)
Creatinine, Ser: 0.85 mg/dL (ref 0.61–1.24)
GFR, Estimated: >60 mL/min (ref >60)
Glucose, Bld: 136 mg/dL — ABNORMAL HIGH (ref 70–99)
Potassium: 3.8 mmol/L (ref 3.5–5.1)
Sodium: 141 mmol/L (ref 135–145)
Total Bilirubin: 0.3 mg/dL (ref 0.3–1.2)
Total Protein: 5.9 g/dL — ABNORMAL LOW (ref 6.5–8.1)

## 2020-09-02 LAB — GLUCOSE, CAPILLARY
Glucose-Capillary: 150 mg/dL — ABNORMAL HIGH (ref 70–99)
Glucose-Capillary: 240 mg/dL — ABNORMAL HIGH (ref 70–99)
Glucose-Capillary: 127 mg/dL — ABNORMAL HIGH (ref 70–99)
Glucose-Capillary: 162 mg/dL — ABNORMAL HIGH (ref 70–99)

## 2020-09-02 LAB — MAGNESIUM: Magnesium: 2.4 mg/dL (ref 1.7–2.4)

## 2020-09-02 MED ORDER — AMIODARONE HCL 200 MG PO TABS
200.0000 mg | ORAL_TABLET | Freq: Every day | ORAL | Status: DC
  Administered 2020-09-09 – 2020-09-10 (×2): 200 mg via ORAL
  Filled 2020-09-02 (×2): qty 1

## 2020-09-02 MED ORDER — PALONOSETRON HCL INJECTION 0.25 MG/5ML
0.2500 mg | Freq: Once | INTRAVENOUS | Status: AC
  Administered 2020-09-02: 0.25 mg via INTRAVENOUS
  Filled 2020-09-02: qty 5

## 2020-09-02 MED ORDER — SODIUM CHLORIDE 0.9 % IV SOLN
96.0000 mg/m2 | Freq: Once | INTRAVENOUS | Status: AC
  Administered 2020-09-02: 160 mg via INTRAVENOUS
  Filled 2020-09-02: qty 8

## 2020-09-02 MED ORDER — LIDOCAINE 5 % EX PTCH
2.0000 | MEDICATED_PATCH | CUTANEOUS | Status: DC
  Administered 2020-09-02 – 2020-09-04 (×3): 2 via TRANSDERMAL
  Filled 2020-09-02 (×4): qty 2

## 2020-09-02 MED ORDER — AMIODARONE HCL 200 MG PO TABS
400.0000 mg | ORAL_TABLET | Freq: Every day | ORAL | Status: AC
  Administered 2020-09-02 – 2020-09-08 (×7): 400 mg via ORAL
  Filled 2020-09-02 (×7): qty 2

## 2020-09-02 NOTE — Progress Notes (Signed)
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   Patient presented with worsening hypoxic respiratory failure, shortness of breath.  CT angio showed interval development of patchy consolidative opacities throughout the right lung which may represent superimposed infectious process or potentially lymphangitic spread of tumor.  Negative for PE.  Bulky mediastinal adenopathy narrows the right lower lobe pulmonary artery.  Interval increase in size of bulky mediastinal and hilar adenopathy as well as multiple bilateral pulmonary metastases.  Redemonstrated partially visualized large pancreatic mass. -Patient placed on IV antibiotics, oncology consulted patient was to start chemo however patient went to A. fib with RVR on 08/29/2020 transferred to the ICU placed on Cardizem drip and subsequently transition to amiodarone drip.  PCCM, cardiology consulted. -Patient subsequently converted to normal sinus rhythm, maintained on amiodarone drip with initiation of chemotherapy which was started 08/31/2020.  Patient on BiPAP nightly.     Assessment & Plan:   Principal Problem:   Acute on chronic respiratory failure with hypoxia (HCC) Active Problems:   Essential hypertension   COPD (chronic obstructive pulmonary disease) (HCC)   Pancreatic mass   Pneumonia   Protein-calorie malnutrition, severe   Small cell carcinoma (HCC)   Leukocytosis   Depression   New onset a-fib (HCC)    acute hypoxic respiratory failure likely multifactorial secondary to pneumonia in the setting of worsening  metastatic cancer/lymphangitic spread of cancer/COPD -increasing O2 requirements when in a fib with RVR -Patient chronically on BiPAP at bedtime prior to admission. -high flow nasal cannula when not on Bipap -Urine strep pneumococcus antigen negative.  Urine Legionella antigen negative.  MRSA PCR negative. -Sputum with rare Enterococcus faecalis, no staph isolated. -Continue IV Unasyn with stop date of 7 days placed -Continue scheduled nebs, Mucinex, Claritin, PPI, IV Solu-Medrol, Pulmicort nebs.  -Decrease IV Solu-Medrol -Patient seen in consultation and being followed by oncology Dr. Marin Olp who discussed with patient's oncologist from [facility] and decision made to start inpatient chemotherapy which was to be started on 08/29/2020 -Status post 40 mg IV Lasix x1 with good urine output .   -Patient was assessed by PCCM who have signed off as of 08/30/2020. -Oncology following.    New onset A. fib with RVR CHA2DS2VASC score 3 -Patient noted to go into A. fib with RVR the morning of 08/29/2020 with heart rates close into the 200s.   -Likely secondary to acute pulmonary issues as in problem #1. -Cardiac enzymes negative.   -Patient with recent 2D echo (08/09/2020) with a EF of 55 to 78%,MVEH, grade 1 diastolic dysfunction, normal right ventricular systolic function, normal pulmonary artery systolic pressure, normal mitral valve structure, no mitral stenosis, trivial mitral valve regurgitation, tricuspid aortic valve, mild to moderate aortic valve sclerosis/calcification is present without any evidence of aortic stenosis. -Repeat 2D echo done 08/29/2020 with EF 60 to 65%,NWMA, normal mitral valve, no evidence of mitral stenosis, normal left atrial size. -Patient was on a Cardizem drip and amiodarone drip and subsequently converted back to normal sinus rhythm. -TSH within normal limits -Cardizem drip has been transitioned to oral Cardizem and dose being adjusted per cardiology. -Currently on amiodarone drip :  per  cards: Will transition from IV Amiodarone to PO Amiodarone - 400mg  daily for 1 week and can then reduce to 200mg  daily. -Chemotherapy started yesterday 08/31/2020.  -PCCM discontinued IV heparin due to hemoptysis and patient currently on prophylactic dose Lovenox. -Per cardiology may need to reassess need for anticoagulation close to discharge.  -Cardiology following and appreciate input and recommendations  Hypertension -BP initially noted to be soft and as such amlodipine initially held and was resumed on 08/28/2020 and subsequently discontinued on 08/29/2020 as patient noted to be in A. fib and was on a Cardizem and amiodarone drip. 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 -Patient being followed by oncology was to be started on chemotherapy on 08/29/2020 however was postponed as patient had gone into A. fib with RVR.   -Patient back in sinus rhythm and currently on amiodarone.   -Patient status post initiation of chemotherapy  08/31/2020.   -Patient seems to have tolerated chemotherapy okay.   -Per oncology.     Depression -Wellbutrin, hydroxyzine for anxiety.    Moderate protein calorie malnutrition in the setting of malignancy -Continue dronabinol -Continue nutritional supplementation with Ensure.    Hyperkalemia -Status post 1 dose IV Lasix.     Leukocytosis -Patient on IV steroids likely contributing to leukocytosis. -Leukocytosis fluctuating  -Continue IV antibiotics secondary to problem #1.     Hyperglycemia -Secondary to IV steroids. -CBG 137 this morning.   -Continue Lantus 10 units daily, SSI.     DVT prophylaxis: Lovenox Code Status: Full Family Communication:  No family at bedside. Disposition:   Status is: Inpatient  Remains  inpatient appropriate because:Inpatient level of care appropriate due to severity of illness  Dispo: The patient is from: Home              Anticipated d/c is to: TBD              Patient currently is not medically stable to d/c.   Difficult to place patient No       Consultants:  Oncology: Dr. Marin Olp PCCM: Dr. Chase Caller 08/29/2020 Cardiology: Dr.Acharya 08/29/2020  Procedures:  CT angiogram chest 08/13/2020 Chest x-ray 08/10/2020, 08/28/2020, 08/29/2020 2D echo 08/29/2020  Antimicrobials:  IV Unasyn 08/28/2020>>>>> IV azithromycin 08/26/2020>>>>> 08/28/2020 IV cefepime 07/27/2020>>>>> 08/28/2020 IV Flagyl 08/26/2020>>>> 08/28/2020 IV vancomycin 08/19/2020>>>> 08/26/2020   Subjective: C/o neck and rib pain  Objective: Vitals:   09/02/20 0832 09/02/20 0900 09/02/20 1000 09/02/20 1157  BP:  (!) 177/96 (!) 168/100   Pulse: 79 77 79   Resp: (!) 21 13 (!) 22   Temp:    98.1 F (36.7 C)  TempSrc:    Axillary  SpO2: 93% 91% 95%   Weight:      Height:        Intake/Output Summary (Last 24 hours) at 09/02/2020 1233 Last data filed at 09/02/2020 1017 Gross per 24 hour  Intake 1221.87 ml  Output 1975 ml  Net -753.13 ml   Filed Weights   08/25/2020 1952 08/27/20 1559 08/29/20 0630  Weight: 61.2 kg 58.7 kg 58.8 kg    Examination:   General: Appearance:    Ill appearing male who appears uncomfortable     Lungs:     Diminished on right  Heart:    Normal heart rate.   MS:   All  extremities are intact.    Neurologic:   Awake, alert, oriented x 3. No apparent focal neurological           defect.         Data Reviewed: I have personally reviewed following labs and imaging studies  CBC: Recent Labs  Lab 08/28/20 0919 08/29/20 0420 08/30/20 0255 08/31/20 0400 09/01/20 0500 09/02/20 0345  WBC 22.0* 20.3* 17.8* 22.4* 24.2* 26.4*  NEUTROABS 21.0* 19.1* 16.4* 21.2* 23.2*  --   HGB 11.1* 11.1* 11.0* 11.5* 11.4* 11.3*  HCT 35.1* 34.6* 35.1* 36.3* 36.2* 36.0*  MCV 84.0 83.2 83.2 84.2 83.6  84.1  PLT 368 364 339 340 269 176    Basic Metabolic Panel: Recent Labs  Lab 08/28/20 0919 08/29/20 0420 08/30/20 0255 08/31/20 0400 09/01/20 0500 09/02/20 0345  NA 138 137 142 142 140 141  K 4.0 4.4 3.6 4.2 3.8 3.8  CL 99 101 106 105 104 104  CO2 26 26 21* 28 28 28   GLUCOSE 173* 160* 135* 154* 139* 136*  BUN 19 23 29* 28* 27* 29*  CREATININE 0.94 1.01 1.08 0.92 0.92 0.85  CALCIUM 9.5 9.2 8.8* 8.8* 8.5* 8.9  MG 2.0 2.3 2.3  --  2.4 2.4  PHOS  --  3.5  --   --   --   --     GFR: Estimated Creatinine Clearance: 61.5 mL/min (by C-G formula based on SCr of 0.85 mg/dL).  Liver Function Tests: Recent Labs  Lab 08/29/20 0420 08/30/20 0255 08/31/20 0400 09/01/20 0500 09/02/20 0345  AST 49* 69* 99* 96* 94*  ALT 56* 56* 70* 68* 65*  ALKPHOS 104 97 100 90 84  BILITOT 0.4 0.3 0.3 0.5 0.3  PROT 6.7 6.1* 6.2* 6.1* 5.9*  ALBUMIN 2.4* 2.3* 2.3* 2.2* 2.0*    CBG: Recent Labs  Lab 09/01/20 1214 09/01/20 1600 09/01/20 2136 09/02/20 0802 09/02/20 1208  GLUCAP 291* 197* 88 150* 240*     Recent Results (from the past 240 hour(s))  Resp Panel by RT-PCR (Flu A&B, Covid) Nasopharyngeal Swab     Status: None   Collection Time: 08/02/2020  1:34 PM   Specimen: Nasopharyngeal Swab; Nasopharyngeal(NP) swabs in vial transport medium  Result Value Ref Range Status   SARS Coronavirus 2 by RT PCR NEGATIVE NEGATIVE Final    Comment: (NOTE) SARS-CoV-2 target nucleic acids are NOT DETECTED.  The SARS-CoV-2 RNA is generally detectable in upper respiratory specimens during the acute phase of infection. The lowest concentration of SARS-CoV-2 viral copies this assay can detect is 138 copies/mL. A negative result does not preclude SARS-Cov-2 infection and should not be used as the sole basis for treatment or other patient management decisions. A negative result may occur with  improper specimen collection/handling, submission of specimen other than nasopharyngeal swab, presence of viral  mutation(s) within the areas targeted by this assay, and inadequate number of viral copies(<138 copies/mL). A negative result must be combined with clinical observations, patient history, and epidemiological information. The expected result is Negative.  Fact Sheet for Patients:  EntrepreneurPulse.com.au  Fact Sheet for Healthcare Providers:  IncredibleEmployment.be  This test is no t yet approved or cleared by the [place] FDA and  has been authorized for detection and/or diagnosis of SARS-CoV-2 by FDA under an Emergency Use Authorization (EUA). This EUA will remain  in effect (meaning this test can be used) for the duration of the COVID-19 declaration under Section 564(b)(1) of the Act, 21 U.S.C.section 360bbb-3(b)(1), unless the authorization  is terminated  or revoked sooner.       Influenza A by PCR NEGATIVE NEGATIVE Final   Influenza B by PCR NEGATIVE NEGATIVE Final    Comment: (NOTE) The Xpert Xpress SARS-CoV-2/FLU/RSV plus assay is intended as an aid in the diagnosis of influenza from Nasopharyngeal swab specimens and should not be used as a sole basis for treatment. Nasal washings and aspirates are unacceptable for Xpert Xpress SARS-CoV-2/FLU/RSV testing.  Fact Sheet for Patients: EntrepreneurPulse.com.au  Fact Sheet for Healthcare Providers: IncredibleEmployment.be  This test is not yet approved or cleared by the [place] FDA and has been authorized for detection and/or diagnosis of SARS-CoV-2 by FDA under an Emergency Use Authorization (EUA). This EUA will remain in effect (meaning this test can be used) for the duration of the COVID-19 declaration under Section 564(b)(1) of the Act, 21 U.S.C. section 360bbb-3(b)(1), unless the authorization is terminated or revoked.  Performed at [facility], [place] [address], [place], [place] [address]   Blood Culture (routine x 2)      Status: None   Collection Time: 08/03/2020  1:34 PM   Specimen: BLOOD RIGHT FOREARM  Result Value Ref Range Status   Specimen Description BLOOD RIGHT FOREARM  Final   Special Requests   Final    BOTTLES DRAWN AEROBIC AND ANAEROBIC Blood Culture adequate volume   Culture   Final    NO GROWTH 5 DAYS Performed at [facility], [place] [address], [place], [place] [address]    Report Status 08/29/2020 FINAL  Final  Blood Culture (routine x 2)     Status: None   Collection Time: 08/19/2020  6:10 PM   Specimen: BLOOD  Result Value Ref Range Status   Specimen Description BLOOD PORTA CATH  Final   Special Requests   Final    BOTTLES DRAWN AEROBIC AND ANAEROBIC Blood Culture adequate volume   Culture   Final    NO GROWTH 5 DAYS Performed at [facility], [place] [address], [place], [place] [address]    Report Status 08/29/2020 FINAL  Final  MRSA Next Gen by PCR, Nasal     Status: None   Collection Time: 08/25/20  8:38 AM   Specimen: Nasal Mucosa; Nasal Swab  Result Value Ref Range Status   MRSA by PCR Next Gen NOT DETECTED NOT DETECTED Final    Comment: (NOTE) The GeneXpert MRSA Assay (FDA approved for NASAL specimens only), is one component of a comprehensive MRSA colonization surveillance program. It is not intended to diagnose MRSA infection nor to guide or monitor treatment for MRSA infections. Test performance is not FDA approved in patients less than 15 years old. Performed at [facility], [place] [address] [place], [place] [address]   Expectorated Sputum Assessment w Gram Stain, Rflx to Resp Cult     Status: None   Collection Time: 08/25/20 10:08 AM   Specimen: Expectorated Sputum  Result Value Ref Range Status   Specimen Description EXPECTORATED SPUTUM  Final   Special Requests NONE  Final   Sputum evaluation   Final    THIS SPECIMEN IS ACCEPTABLE FOR SPUTUM CULTURE Performed at [facility], [place] [address], [place], [place] [address]    Report Status  08/26/2020 FINAL  Final  Culture, Respiratory w Gram Stain     Status: None   Collection Time: 08/25/20 10:08 AM  Result Value Ref Range Status   Specimen Description EXPECTORATED SPUTUM  Final  Special Requests NONE Reflexed from [id number]  Final   Gram Stain   Final    RARE WBC PRESENT,BOTH PMN AND MONONUCLEAR RARE GRAM POSITIVE COCCI IN PAIRS    Culture   Final    RARE ENTEROCOCCUS FAECALIS NO STAPHYLOCOCCUS AUREUS ISOLATED No Pseudomonas species isolated Performed at [facility], [address], [place], [place] [address]    Report Status 08/28/2020 FINAL  Final   Organism ID, Bacteria ENTEROCOCCUS FAECALIS  Final      Susceptibility   Enterococcus faecalis - MIC*    AMPICILLIN <=2 SENSITIVE Sensitive     VANCOMYCIN 1 SENSITIVE Sensitive     GENTAMICIN SYNERGY SENSITIVE Sensitive     * RARE ENTEROCOCCUS FAECALIS          Radiology Studies: DG CHEST PORT 1 VIEW  Result Date: 08/31/2020 CLINICAL DATA:  Increased shortness of breath EXAM: PORTABLE CHEST 1 VIEW COMPARISON:  08/30/2020 FINDINGS: Patchy bilateral airspace disease and numerous pulmonary nodules again noted, unchanged. Heart is normal size. Right Port-A-Cath remains in place, unchanged. IMPRESSION: Patchy bilateral lung nodules and infiltrates. No significant change since prior study. Electronically Signed   By: Rolm Baptise M.D.   On: 08/31/2020 17:28        Scheduled Meds:  amiodarone  400 mg Oral Daily   Followed by   Derrill Memo ON 09/09/2020] amiodarone  200 mg Oral Daily   budesonide (PULMICORT) nebulizer solution  0.5 mg Nebulization BID   buPROPion  300 mg Oral Daily   chlorhexidine  15 mL Mouth Rinse BID   Chlorhexidine Gluconate Cloth  6 each Topical Daily   diltiazem  240 mg Oral Daily   dronabinol  2.5 mg Oral BID AC   enoxaparin (LOVENOX) injection  40 mg Subcutaneous Q24H   etoposide  96 mg/m2 (Treatment Plan Recorded) Intravenous Once   feeding supplement  237 mL Oral TID BM   gabapentin   300 mg Oral TID   guaiFENesin  1,200 mg Oral BID   insulin aspart  0-15 Units Subcutaneous TID WC   insulin glargine-yfgn  10 Units Subcutaneous Daily   ipratropium  0.5 mg Nebulization TID   levalbuterol  0.63 mg Nebulization TID   lidocaine  2 patch Transdermal Q24H   loratadine  10 mg Oral Daily   mouth rinse  15 mL Mouth Rinse q12n4p   methylPREDNISolone (SOLU-MEDROL) injection  120 mg Intravenous Q12H   multivitamin with minerals  1 tablet Oral Daily   palonosetron  0.25 mg Intravenous Once   pantoprazole  40 mg Oral Daily   polyethylene glycol  17 g Oral Daily   sodium chloride flush  10-40 mL Intracatheter Q12H   sodium chloride flush  3 mL Intravenous Q12H   Continuous Infusions:  sodium chloride     ampicillin-sulbactam (UNASYN) IV Stopped (09/02/20 1029)     LOS: 9 days    Time spent: 45 minutes    Geradine Girt, DO [facility]   To contact the attending provider between 7A-7P or the covering provider during after hours 7P-7A, please log into the web site [email or web address] and access using universal [place] password for that web site. If you do not have the password, please call the hospital operator.  09/02/2020, 12:33 PM

## 2020-09-02 NOTE — Progress Notes (Signed)
So far, so good with the chemotherapy.  He will have his third day of chemotherapy today.  So far, he is tolerated fairly well.  He still is on the high flow oxygen.  His O2 saturation is still pretty good.  His heart rate is still normal sinus rhythm.  But I think the lymph node in the left supraclavicular region is what we can monitor to see if he is responding to treatment.  Given that this is a small cell carcinoma, I would have to think that in 10-14 days, we should see this shrinking.  The labs today show white cell count 26.4.  Hemoglobin 11.3.  Platelet count 258,000.  His BUN is 29 creatinine 0.85.  Calcium is 8.9 with an albumin of 2.  LFTs are slightly elevated.  I will think nausea is too much of a problem right now.  We will have to make sure that he is on a good antiemetic regimen.  He is quite deconditioned.  He is going to need quite a bit of physical therapy.  His vital signs all look pretty good.  Temperature 97.2.  Pulse 72.  Blood pressure 163/91.  His lungs still sound clear over on the left side.  There is still decreased on the right side.  Cardiac exam regular rate and rhythm.  Abdomen is soft.  He has no fluid wave.  There is no guarding or rebound tenderness.  Extremity shows no clubbing, cyanosis or edema.  Neurological exam is nonfocal.  Mr. Malone will finish of the first cycle of chemotherapy today.  I do very much appreciate the outstanding work that was done over the weekend by everybody to try to do the chemotherapy.  I have to believe that this will enhance his outcome.  Lattie Haw, MD  Proverbs 11:2

## 2020-09-02 NOTE — TOC Progression Note (Signed)
Transition of Care [facility]) - Progression Note    Patient Details  Name: Grant Taylor MRN: [id number] Date of Birth: Jun 28, 1945  Transition of Care [facility]) CM/SW Contact  Leeroy Cha, RN Phone Number: 09/02/2020, 9:24 AM  Clinical Narrative:    #1 acute hypoxic respiratory failure likely multifactorial secondary to pneumonia in the setting of worsening metastatic cancer/lymphangitic spread of cancer/COPD -Patient with increasing O2 requirements up to 14 L high flow nasal cannula and nonrebreather the morning of 08/29/2020 and noted to have gone into A. fib with RVR. -Patient also noted to have gone into A. fib with RVR which likely also contributing to worsening respiratory distress. -Patient back in sinus rhythm and noted to be on BiPAP throughout the day yesterday and at bedtime.   -Patient chronically on BiPAP at bedtime prior to admission. -Patient currently on 15 L high flow nasal cannula. -Urine strep pneumococcus antigen negative.  Urine Legionella antigen negative.  MRSA PCR negative. -Sputum with rare Enterococcus faecalis, no staph isolated. -Procalcitonin noted to be borderline. -ABG the morning of 08/29/2020, with a pH of 7.491/PCO2 of 29/PO2 of 63/bicarb of 22. -IV cefepime, IV azithromycin, IV Flagyl, IV vancomycin have been discontinued. -Continue IV Unasyn . -Continue scheduled nebs, Mucinex, Claritin, PPI, IV Solu-Medrol, Pulmicort nebs.  -Decrease IV Solu-Medrol to 80 mg IV every 8 hours. -Patient seen in consultation and being followed by oncology Dr. Marin Olp who discussed with patient's oncologist from [facility] and decision made to start inpatient chemotherapy which was to be started on 08/29/2020, however was postponed as patient had gone into A. fib with RVR.   -Patient currently in sinus rhythm. -Status post 40 mg IV Lasix x1 with good urine output . -Patient with urine output of 2.550 L over the past 24 hours.   -Currently in the ICU.   -Patient states feeling  much better than he did yesterday.  -Patient was assessed by PCCM who have signed off as of 08/30/2020. -Patient was started on chemotherapy 08/31/2020 which she seems to have tolerated.   -Oncology following.     2.  New onset A. fib with RVR CHA2DS2VASC score 3 -Patient noted to go into A. fib with RVR the morning of 08/29/2020 with heart rates close into the 200s.   -Likely secondary to acute pulmonary issues as in problem #1. -Cardiac enzymes negative.   -Patient with recent 2D echo (08/09/2020) with a EF of 55 to 93%,OIZT, grade 1 diastolic dysfunction, normal right ventricular systolic function, normal pulmonary artery systolic pressure, normal mitral valve structure, no mitral stenosis, trivial mitral valve regurgitation, tricuspid aortic valve, mild to moderate aortic valve sclerosis/calcification is present without any evidence of aortic stenosis. -Repeat 2D echo done 08/29/2020 with EF 60 to 65%,NWMA, normal mitral valve, no evidence of mitral stenosis, normal left atrial size. -Patient was on a Cardizem drip and amiodarone drip and subsequently converted back to normal sinus rhythm. -TSH within normal limits -Cardizem drip has been transitioned to oral Cardizem and dose being adjusted per cardiology. -Currently on amiodarone drip which will be continued through initial chemotherapy per cardiology recommendations.   -Chemotherapy started yesterday 08/31/2020.  -PCCM discontinued IV heparin due to hemoptysis and patient currently on prophylactic dose Lovenox. -Per cardiology may need to reassess need for anticoagulation close to discharge. -Cardiology following and appreciate input and recommendations  3.  Hypertension -BP initially noted to be soft and as such amlodipine initially held and was resumed on 08/28/2020 and subsequently discontinued on 08/29/2020 as patient noted  to be in A. fib and was on a Cardizem and amiodarone drip. -Oral Cardizem dose increased to 240 mg daily per cardiology and  patient on amiodarone drip.   -Follow.    4.  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 -Patient being followed by oncology was to be started on chemotherapy on 08/29/2020 however was postponed as patient had gone into A. fib with RVR.   -Patient back in sinus rhythm and currently on amiodarone.   -Patient status post initiation of chemotherapy yesterday 08/31/2020.  -Patient seems to have tolerated chemotherapy okay.   -Per oncology.    5.  Depression -Wellbutrin, hydroxyzine for anxiety.    6.  Moderate protein calorie malnutrition in the setting of malignancy -Continue dronabinol -Continue nutritional supplementation with Ensure.  7.  Hyperkalemia -Status post 1 dose IV Lasix.   -Potassium at 3.8.   -Follow.    8.  Leukocytosis -Patient on IV steroids likely contributing to leukocytosis. -Leukocytosis fluctuating currently at 24.2 from 22.4 from 17.8 from 20.3 from 22 from 16.4 on admission . -Continue IV antibiotics secondary to problem #1.    9.  Hyperglycemia -Secondary to IV steroids. -CBG 137 this morning.   -Continue Lantus 10 units daily, SSI.       DVT prophylaxis: Lovenox Code Status: Full Family Communication: Updated patient.  No family at bedside. Disposition:    Status is: Inpatient   Remains inpatient appropriate because:Inpatient level of care appropriate due to severity of illness   Dispo: The patient is from: Home              Anticipated d/c is to: TBD              Patient currently is not medically stable to d/c.              Difficult to place patient No  TOC PLAN OF CARE: folloqing the above aND FOR TOC NEEDS    Expected Discharge Plan: [place] Barriers to Discharge: Continued Medical Work up  Expected Discharge Plan and  Services Expected Discharge Plan: [place]   Discharge Planning Services: CM Consult Post Acute Care Choice: Durable Medical Equipment, Home Health Living arrangements for the past 2 months: Single Family Home                                       Social Determinants of Health (SDOH) Interventions    Readmission Risk Interventions No flowsheet data found.

## 2020-09-02 NOTE — Progress Notes (Addendum)
Progress Note  Patient Name: Grant Taylor Date of Encounter: 09/02/2020  [facility] Cardiologist: Elouise Munroe, MD   Subjective   No acute overnight events. Doing well from a cardiac standpoint. Maintaining sinus rhythm with rates in the 70s to 90s. He notes mild pleuritic chest pain if he takes a really deep breathing. Intermittent on BiPAP. Currently on high flow nasal cannula. Main complaint this morning is right neck pain that improves with massaging. Also has some intermittent left flank pain.  Inpatient Medications    Scheduled Meds:  budesonide (PULMICORT) nebulizer solution  0.5 mg Nebulization BID   buPROPion  300 mg Oral Daily   chlorhexidine  15 mL Mouth Rinse BID   Chlorhexidine Gluconate Cloth  6 each Topical Daily   diltiazem  240 mg Oral Daily   dronabinol  2.5 mg Oral BID AC   enoxaparin (LOVENOX) injection  40 mg Subcutaneous Q24H   etoposide  96 mg/m2 (Treatment Plan Recorded) Intravenous Once   feeding supplement  237 mL Oral TID BM   gabapentin  300 mg Oral TID   guaiFENesin  1,200 mg Oral BID   insulin aspart  0-15 Units Subcutaneous TID WC   insulin glargine-yfgn  10 Units Subcutaneous Daily   ipratropium  0.5 mg Nebulization TID   levalbuterol  0.63 mg Nebulization TID   loratadine  10 mg Oral Daily   mouth rinse  15 mL Mouth Rinse q12n4p   methylPREDNISolone (SOLU-MEDROL) injection  120 mg Intravenous Q12H   multivitamin with minerals  1 tablet Oral Daily   palonosetron  0.25 mg Intravenous Once   pantoprazole  40 mg Oral Daily   polyethylene glycol  17 g Oral Daily   sodium chloride flush  10-40 mL Intracatheter Q12H   sodium chloride flush  3 mL Intravenous Q12H   Continuous Infusions:  sodium chloride     amiodarone 30 mg/hr (09/02/20 0738)   ampicillin-sulbactam (UNASYN) IV 3 g (09/02/20 0959)   PRN Meds: acetaminophen, albuterol, benzonatate, docusate sodium, fluticasone, guaiFENesin-codeine, hydrALAZINE, HYDROmorphone  (DILAUDID) injection, hydrOXYzine, nitroGLYCERIN, ondansetron (ZOFRAN) IV, oxyCODONE, polyvinyl alcohol, sodium chloride, sodium chloride flush, tiZANidine   Vital Signs    Vitals:   09/02/20 0800 09/02/20 0813 09/02/20 0814 09/02/20 0832  BP: (!) 155/96  (!) 155/96   Pulse: 75  77 79  Resp: 14  19 (!) 21  Temp: (!) 97.4 F (36.3 C)     TempSrc: Oral     SpO2: 94% 94% 94% 93%  Weight:      Height:        Intake/Output Summary (Last 24 hours) at 09/02/2020 1023 Last data filed at 09/02/2020 1017 Gross per 24 hour  Intake 1440.01 ml  Output 1975 ml  Net -534.99 ml   Last 3 Weights 08/29/2020 08/27/2020 08/01/2020  Weight (lbs) 129 lb 10.1 oz 129 lb 4.8 oz 135 lb  Weight (kg) 58.8 kg 58.65 kg 61.236 kg      Telemetry    Normal sinus rhythm with rates in the 70s to 90s. - Personally Reviewed  ECG   No new ECG tracing today. - Personally Reviewed  Physical Exam   GEN: Frail African American male on high flow nasal cannula. Neck: No JVD. Cardiac: RRR. No murmur, gallops, or rubs. Respiratory: Clear to auscultation bilaterally. GI: Soft, non-distended, and non-tender. MS: No lower extremity edema. No deformity. Skin: Warm and dry. Neuro:  No focal deficits. Psych: Normal affect.  Labs    High Sensitivity  Troponin:   Recent Labs  Lab 08/09/20 0636 08/09/20 1002 07/30/2020 1225 08/06/2020 1545 08/29/20 0819  TROPONINIHS 10 10 16  18* 11      Chemistry Recent Labs  Lab 08/31/20 0400 09/01/20 0500 09/02/20 0345  NA 142 140 141  K 4.2 3.8 3.8  CL 105 104 104  CO2 28 28 28   GLUCOSE 154* 139* 136*  BUN 28* 27* 29*  CREATININE 0.92 0.92 0.85  CALCIUM 8.8* 8.5* 8.9  PROT 6.2* 6.1* 5.9*  ALBUMIN 2.3* 2.2* 2.0*  AST 99* 96* 94*  ALT 70* 68* 65*  ALKPHOS 100 90 84  BILITOT 0.3 0.5 0.3  GFRNONAA >60 >60 >60  ANIONGAP 9 8 9      Hematology Recent Labs  Lab 08/31/20 0400 09/01/20 0500 09/02/20 0345  WBC 22.4* 24.2* 26.4*  RBC 4.31 4.33 4.28  HGB 11.5* 11.4*  11.3*  HCT 36.3* 36.2* 36.0*  MCV 84.2 83.6 84.1  MCH 26.7 26.3 26.4  MCHC 31.7 31.5 31.4  RDW 17.5* 17.8* 18.2*  PLT 340 269 258    BNP Recent Labs  Lab 08/29/20 0430  BNP 219.4*     DDimer  Recent Labs  Lab 08/29/20 0400  DDIMER 7.68*     Radiology    DG CHEST PORT 1 VIEW  Result Date: 08/31/2020 CLINICAL DATA:  Increased shortness of breath EXAM: PORTABLE CHEST 1 VIEW COMPARISON:  08/30/2020 FINDINGS: Patchy bilateral airspace disease and numerous pulmonary nodules again noted, unchanged. Heart is normal size. Right Port-A-Cath remains in place, unchanged. IMPRESSION: Patchy bilateral lung nodules and infiltrates. No significant change since prior study. Electronically Signed   By: Rolm Baptise M.D.   On: 08/31/2020 17:28    Cardiac Studies   Echocardiogram 08/29/2020: Impressions: 1. Left ventricular ejection fraction, by estimation, is 60 to 65%. The  left ventricle has normal function. The left ventricle has no regional  wall motion abnormalities. Left ventricular diastolic parameters are  indeterminate.   2. Right ventricular systolic function is normal. The right ventricular  size is normal. There is normal pulmonary artery systolic pressure. The  estimated right ventricular systolic pressure is 12.8 mmHg.   3. The mitral valve is normal in structure. Trivial mitral valve  regurgitation. No evidence of mitral stenosis.   4. The aortic valve is tricuspid. Aortic valve regurgitation is not  visualized. Mild aortic valve sclerosis is present, with no evidence of  aortic valve stenosis.   5. The inferior vena cava is normal in size with greater than 50%  respiratory variability, suggesting right atrial pressure of 3 mmHg.   6. The patient was in atrial fibrillation.   Patient Profile     75 y.o. male with a history of COPD, obstructive sleep apnea, hypertension, hyperlipidemia, GERD, prior tobacco use (quit 5-6 years ago), and recently diagnosed metastatic cancer  who was admitted on 08/13/2020 for acute hypoxic respiratory failure secondary to pneumonia and metastatic cancer. Cardiology consulted on 08/29/2020 for evaluation of  is being seen 08/29/2020 for the evaluation of new onset atrial fibrillation with RVR at the request of Dr. Grandville Silos.  Assessment & Plan    New Onset Atrial Fibrillation with RVR - Patient presented with acute hypoxic respiratory failure secondary to pneumonia and metastatic pancreatic cancer with mets to the lungs. Developed rapid atrial fibrillation on the morning of 8/4 but converted back to sinus rhythm after being started on IV Cardizem and IV Amiodarone.He went into rapid atrial fibrillation early this morning with rates as high as  the 180s. - Electrolytes and TSH normal. - Echo showed normal LVEF of 60-65%. - Currently on IV Amiodarone and PO Cardizem 240mg  daily. Will continue PO Cardizem. Will transition from IV Amiodarone to PO Amiodarone - 400mg  daily for 1 week and can then reduce to 200mg  daily. - CHA2DS2-VASc = 3 (HTN and age x2). Initially started on IV Heparin but this was stopped after he converted back to sinus rhythm. Has not been started on oral anticoagulation due to reports of hemoptysis.    Acute Hypoxic Respiratory Failure - Felt to be multifactorial secondary to pneumonia in the setting of worsening metastatic cancer and underlying COPD. Requiring intermittent BiPAP. - LV function normal on recent Echo last month. - BNP mildly elevated at 219. - Most recent chest x-ray on 08/31/2020 showed patchy bilateral lung nodules and infiltrates. - Received a couple dose of IV Lasix this admission. Last dose on 8/4. Net negative 6.8 L this admission. - Suspect respiratory failure due to underlying pneumonia and metastatic disease not CHF. He appears euvolemic on exam. I don't think he needs any additional IV Lasix.  - Management per primary team.   Chest Pain - Patient complained of chest pain during episodes of atrial  fibrillation with RVR. Resolved with restoration of sinus rhythm. He notes some mild pleuritic chest pain when taking a really deep breath now but nothing that sounds like angina. - No acute ST/T changes when in sinus rhythm. - High-sensitivity troponin 18 >>> 11.  - Echo shows normal LV function and wall motion. - No ischemic work-up necessary.   Hypertension - BP elevated in the 140s-160s/90s. - Continue PO Cardizem 240mg  daily. - Consider restarting home Amlodipine.   Otherwise, per primary team: - Pneumonia - Metastatic pancreatic cancer with mets to the lung/small cell carcinoma: Oncology following - COPD - Severe protein-calorie malnutrition  For questions or updates, please contact [facility] Please consult [email or web address] for contact info under        Signed, Darreld Mclean, PA-C  09/02/2020, 10:23 AM    I have seen and examined the patient along with Darreld Mclean, PA-C .  I have reviewed the chart, notes and new data.  I agree with PA/NP's note.  Key new complaints: no CV symptoms Key examination changes: maintaining NSR Key new findings / data: essentially normal echo  PLAN: [place] will sign off.   Medication Recommendations:  stop iv AMIO. Amiodarone PO 400 mg daily for one week, then 200 mg daily. No anticoagulation due to hemoptysis and ongoing oncological workup Other recommendations (labs, testing, etc):  note that transaminases 2-3 x ULN precede amiodarone therapy. Follow closely. TSH was normal, repeat in no more than 6 months Follow up as an outpatient:  will arrange follow up in one month   Sanda Klein, MD, [place] [phone] 09/02/2020, 11:21 AM

## 2020-09-03 DIAGNOSIS — J9621 Acute and chronic respiratory failure with hypoxia: Secondary | ICD-10-CM | POA: Diagnosis not present

## 2020-09-03 LAB — CBC WITH DIFFERENTIAL/PLATELET
Abs Immature Granulocytes: 0.23 10*3/uL — ABNORMAL HIGH (ref 0.00–0.07)
Basophils Absolute: 0 10*3/uL (ref 0.0–0.1)
Basophils Relative: 0 %
Eosinophils Absolute: 0 10*3/uL (ref 0.0–0.5)
Eosinophils Relative: 0 %
HCT: 34.3 % — ABNORMAL LOW (ref 39.0–52.0)
Hemoglobin: 10.8 g/dL — ABNORMAL LOW (ref 13.0–17.0)
Immature Granulocytes: 1 %
Lymphocytes Relative: 1 %
Lymphs Abs: 0.1 10*3/uL — ABNORMAL LOW (ref 0.7–4.0)
MCH: 26.6 pg (ref 26.0–34.0)
MCHC: 31.5 g/dL (ref 30.0–36.0)
MCV: 84.5 fL (ref 80.0–100.0)
Monocytes Absolute: 0.1 10*3/uL (ref 0.1–1.0)
Monocytes Relative: 0 %
Neutro Abs: 20.2 10*3/uL — ABNORMAL HIGH (ref 1.7–7.7)
Neutrophils Relative %: 98 %
Platelets: 228 10*3/uL (ref 150–400)
RBC: 4.06 MIL/uL — ABNORMAL LOW (ref 4.22–5.81)
RDW: 18 % — ABNORMAL HIGH (ref 11.5–15.5)
WBC: 20.7 10*3/uL — ABNORMAL HIGH (ref 4.0–10.5)
nRBC: 0 % (ref 0.0–0.2)

## 2020-09-03 LAB — COMPREHENSIVE METABOLIC PANEL
ALT: 59 U/L — ABNORMAL HIGH (ref 0–44)
AST: 84 U/L — ABNORMAL HIGH (ref 15–41)
Albumin: 2 g/dL — ABNORMAL LOW (ref 3.5–5.0)
Alkaline Phosphatase: 86 U/L (ref 38–126)
Anion gap: 4 — ABNORMAL LOW (ref 5–15)
BUN: 29 mg/dL — ABNORMAL HIGH (ref 8–23)
CO2: 30 mmol/L (ref 22–32)
Calcium: 8.7 mg/dL — ABNORMAL LOW (ref 8.9–10.3)
Chloride: 106 mmol/L (ref 98–111)
Creatinine, Ser: 0.92 mg/dL (ref 0.61–1.24)
GFR, Estimated: >60 mL/min (ref >60)
Glucose, Bld: 118 mg/dL — ABNORMAL HIGH (ref 70–99)
Potassium: 3.8 mmol/L (ref 3.5–5.1)
Sodium: 140 mmol/L (ref 135–145)
Total Bilirubin: 0.4 mg/dL (ref 0.3–1.2)
Total Protein: 5.6 g/dL — ABNORMAL LOW (ref 6.5–8.1)

## 2020-09-03 LAB — GLUCOSE, CAPILLARY
Glucose-Capillary: 111 mg/dL — ABNORMAL HIGH (ref 70–99)
Glucose-Capillary: 249 mg/dL — ABNORMAL HIGH (ref 70–99)
Glucose-Capillary: 147 mg/dL — ABNORMAL HIGH (ref 70–99)
Glucose-Capillary: 88 mg/dL (ref 70–99)

## 2020-09-03 MED ORDER — METHYLPREDNISOLONE SODIUM SUCC 125 MG IJ SOLR
60.0000 mg | INTRAMUSCULAR | Status: DC
  Administered 2020-09-03 – 2020-09-06 (×4): 60 mg via INTRAVENOUS
  Filled 2020-09-03 (×4): qty 2

## 2020-09-03 MED ORDER — METHYLPREDNISOLONE SODIUM SUCC 125 MG IJ SOLR: 120.0000 mg | INTRAMUSCULAR | Status: DC

## 2020-09-03 NOTE — Progress Notes (Signed)
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   Patient presented with worsening hypoxic respiratory failure, shortness of breath.  CT angio showed interval development of patchy consolidative opacities throughout the right lung which may represent superimposed infectious process or potentially lymphangitic spread of tumor.  Negative for PE.  Bulky mediastinal adenopathy narrows the right lower lobe pulmonary artery.  Interval increase in size of bulky mediastinal and hilar adenopathy as well as multiple bilateral pulmonary metastases.  Redemonstrated partially visualized large pancreatic mass. -Patient placed on IV antibiotics, oncology consulted patient was to start chemo however patient went to A. fib with RVR on 08/29/2020 transferred to the ICU placed on Cardizem drip and subsequently transition to amiodarone drip.  PCCM, cardiology consulted. -Patient subsequently converted to normal sinus rhythm, maintained on amiodarone drip with initiation of chemotherapy which was started 08/31/2020.  Patient on BiPAP nightly.     Assessment & Plan:   Principal Problem:   Acute on chronic respiratory failure with hypoxia (HCC) Active Problems:   Essential hypertension   COPD (chronic obstructive pulmonary disease) (HCC)   Pancreatic mass   Pneumonia   Protein-calorie malnutrition, severe   Small cell carcinoma (HCC)   Leukocytosis   Depression   New onset a-fib (HCC)    acute hypoxic respiratory failure likely multifactorial secondary to pneumonia in the setting of worsening  metastatic cancer/lymphangitic spread of cancer/COPD -increased O2 requirements when in a fib with RVR -Patient chronically on BiPAP at bedtime prior to admission. -heated high flow nasal cannula when not on Bipap -Urine strep pneumococcus antigen negative.  Urine Legionella antigen negative.  MRSA PCR negative. -Sputum with rare Enterococcus faecalis, no staph isolated. -Continue IV Unasyn with stop date of 7 days placed -Continue scheduled nebs, Mucinex, Claritin, PPI, IV Solu-Medrol, Pulmicort nebs.  -wean steroids -Patient seen in consultation and being followed by oncology Dr. Marin Olp who discussed with patient's oncologist from [facility] and decision made to start inpatient chemotherapy -Patient was assessed by PCCM who have signed off as of 08/30/2020. -Oncology following.    New onset A. fib with RVR CHA2DS2VASC score 3 -Patient noted to go into A. fib with RVR the morning of 08/29/2020 with heart rates close into the 200s.   -Likely secondary to acute pulmonary issues as in problem #1. -Cardiac enzymes negative.   -Patient with recent 2D echo (08/09/2020) with a EF of 55 to 46%,NGEX, grade 1 diastolic dysfunction, normal right ventricular systolic function, normal pulmonary artery systolic pressure, normal mitral valve structure, no mitral stenosis, trivial mitral valve regurgitation, tricuspid aortic valve, mild to moderate aortic valve sclerosis/calcification is present without any evidence of aortic stenosis. -Repeat 2D echo done 08/29/2020 with EF 60 to 65%,NWMA, normal mitral valve, no evidence of mitral stenosis, normal left atrial size. -Patient was on a Cardizem drip and amiodarone drip and subsequently converted back to normal sinus rhythm. -TSH within normal limits -Cardizem drip has been transitioned to oral Cardizem and dose being adjusted per cardiology. -per cards: Will transition from IV Amiodarone to PO Amiodarone - 400mg  daily for 1 week and can then reduce to 200mg  daily. -PCCM  discontinued IV heparin due to hemoptysis and patient currently on prophylactic dose Lovenox. -Per cardiology may need to reassess need for anticoagulation close to discharge.  -Cardiology following and appreciate input and recommendations  Hypertension -BP initially noted to be soft and as such amlodipine initially held and was resumed on 08/28/2020 and subsequently discontinued on 08/29/2020 as patient noted to be in A. fib and was on a Cardizem and amiodarone drip. 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 -initiation of chemotherapy  08/31/2020.   -Patient seems to have tolerated chemotherapy okay.   -Per oncology.     Depression -Wellbutrin, hydroxyzine for anxiety.    Moderate protein calorie malnutrition in the setting of malignancy -Continue dronabinol -Continue nutritional supplementation with Ensure.    Hyperkalemia -Status post 1 dose IV Lasix.     Leukocytosis -Patient on IV steroids likely contributing to leukocytosis. -Leukocytosis fluctuating  -Continue IV antibiotics secondary to problem #1.     Hyperglycemia -Secondary to IV steroids. -CBG 137 this morning.   -Continue Lantus 10 units daily, SSI.     DVT prophylaxis: Lovenox Code Status: Full Family Communication:  No family at bedside. Disposition:   Status is: Inpatient  Remains inpatient appropriate because:Inpatient level of care appropriate due to severity of illness  Dispo: The patient is from: Home              Anticipated d/c is to: TBD              Patient currently is not medically stable to d/c.   Difficult to place patient No       Consultants:  Oncology: Dr. Marin Olp PCCM Cardiology  Procedures:  CT angiogram chest 08/23/2020 Chest x-ray 08/19/2020,  08/28/2020, 08/29/2020 2D echo 08/29/2020    Subjective: Lidocaine patches helped pain  Objective: Vitals:   09/03/20 0744 09/03/20 0755 09/03/20 0800 09/03/20 0816  BP:      Pulse:  82  86  Resp:  19  19  Temp:   98.4 F (36.9 C)   TempSrc:   Axillary   SpO2: 96% 93%  93%  Weight:      Height:        Intake/Output Summary (Last 24 hours) at 09/03/2020 0858 Last data filed at 09/03/2020 6222 Gross per 24 hour  Intake 2257.42 ml  Output 2175 ml  Net 82.42 ml   Filed Weights   08/01/2020 1952 08/27/20 1559 08/29/20 0630  Weight: 61.2 kg 58.7 kg 58.8 kg    Examination:   General: Appearance:    Ill appearing/frail male in no acute distress     Lungs:     Breath sounds improved, respirations unlabored on HFNC  Heart:    Normal heart rate.   MS:   All extremities are intact.    Neurologic:   Awake, alert, cooperative          Data Reviewed: I have personally reviewed following labs and imaging studies  CBC: Recent Labs  Lab 08/29/20 0420 08/30/20 0255 08/31/20 0400 09/01/20 0500 09/02/20 0345 09/03/20 0640  WBC 20.3* 17.8* 22.4* 24.2* 26.4* 20.7*  NEUTROABS 19.1* 16.4* 21.2* 23.2*  --  20.2*  HGB 11.1* 11.0* 11.5* 11.4* 11.3* 10.8*  HCT 34.6* 35.1* 36.3* 36.2* 36.0* 34.3*  MCV 83.2 83.2 84.2 83.6 84.1 84.5  PLT 364 339 340  269 258 458    Basic Metabolic Panel: Recent Labs  Lab 08/28/20 0919 08/29/20 0420 08/30/20 0255 08/31/20 0400 09/01/20 0500 09/02/20 0345 09/03/20 0640  NA 138 137 142 142 140 141 140  K 4.0 4.4 3.6 4.2 3.8 3.8 3.8  CL 99 101 106 105 104 104 106  CO2 26 26 21* 28 28 28 30   GLUCOSE 173* 160* 135* 154* 139* 136* 118*  BUN 19 23 29* 28* 27* 29* 29*  CREATININE 0.94 1.01 1.08 0.92 0.92 0.85 0.92  CALCIUM 9.5 9.2 8.8* 8.8* 8.5* 8.9 8.7*  MG 2.0 2.3 2.3  --  2.4 2.4  --   PHOS  --  3.5  --   --   --   --   --     GFR: Estimated Creatinine Clearance: 56.8 mL/min (by C-G formula based on SCr of 0.92 mg/dL).  Liver Function  Tests: Recent Labs  Lab 08/30/20 0255 08/31/20 0400 09/01/20 0500 09/02/20 0345 09/03/20 0640  AST 69* 99* 96* 94* 84*  ALT 56* 70* 68* 65* 59*  ALKPHOS 97 100 90 84 86  BILITOT 0.3 0.3 0.5 0.3 0.4  PROT 6.1* 6.2* 6.1* 5.9* 5.6*  ALBUMIN 2.3* 2.3* 2.2* 2.0* 2.0*    CBG: Recent Labs  Lab 09/02/20 0802 09/02/20 1208 09/02/20 1734 09/02/20 2059 09/03/20 0747  GLUCAP 150* 240* 127* 162* 111*     Recent Results (from the past 240 hour(s))  Resp Panel by RT-PCR (Flu A&B, Covid) Nasopharyngeal Swab     Status: None   Collection Time: 07/27/2020  1:34 PM   Specimen: Nasopharyngeal Swab; Nasopharyngeal(NP) swabs in vial transport medium  Result Value Ref Range Status   SARS Coronavirus 2 by RT PCR NEGATIVE NEGATIVE Final    Comment: (NOTE) SARS-CoV-2 target nucleic acids are NOT DETECTED.  The SARS-CoV-2 RNA is generally detectable in upper respiratory specimens during the acute phase of infection. The lowest concentration of SARS-CoV-2 viral copies this assay can detect is 138 copies/mL. A negative result does not preclude SARS-Cov-2 infection and should not be used as the sole basis for treatment or other patient management decisions. A negative result may occur with  improper specimen collection/handling, submission of specimen other than nasopharyngeal swab, presence of viral mutation(s) within the areas targeted by this assay, and inadequate number of viral copies(<138 copies/mL). A negative result must be combined with clinical observations, patient history, and epidemiological information. The expected result is Negative.  Fact Sheet for Patients:  EntrepreneurPulse.com.au  Fact Sheet for Healthcare Providers:  IncredibleEmployment.be  This test is no t yet approved or cleared by the [place] FDA and  has been authorized for detection and/or diagnosis of SARS-CoV-2 by FDA under an Emergency Use Authorization (EUA). This EUA  will remain  in effect (meaning this test can be used) for the duration of the COVID-19 declaration under Section 564(b)(1) of the Act, 21 U.S.C.section 360bbb-3(b)(1), unless the authorization is terminated  or revoked sooner.       Influenza A by PCR NEGATIVE NEGATIVE Final   Influenza B by PCR NEGATIVE NEGATIVE Final    Comment: (NOTE) The Xpert Xpress SARS-CoV-2/FLU/RSV plus assay is intended as an aid in the diagnosis of influenza from Nasopharyngeal swab specimens and should not be used as a sole basis for treatment. Nasal washings and aspirates are unacceptable for Xpert Xpress SARS-CoV-2/FLU/RSV testing.  Fact Sheet for Patients: EntrepreneurPulse.com.au  Fact Sheet for Healthcare Providers: IncredibleEmployment.be  This test is not yet approved or cleared  by the [place] and has been authorized for detection and/or diagnosis of SARS-CoV-2 by FDA under an Emergency Use Authorization (EUA). This EUA will remain in effect (meaning this test can be used) for the duration of the COVID-19 declaration under Section 564(b)(1) of the Act, 21 U.S.C. section 360bbb-3(b)(1), unless the authorization is terminated or revoked.  Performed at [facility], [place] [address], [place], [place] [address]   Blood Culture (routine x 2)     Status: None   Collection Time: 07/27/2020  1:34 PM   Specimen: BLOOD RIGHT FOREARM  Result Value Ref Range Status   Specimen Description BLOOD RIGHT FOREARM  Final   Special Requests   Final    BOTTLES DRAWN AEROBIC AND ANAEROBIC Blood Culture adequate volume   Culture   Final    NO GROWTH 5 DAYS Performed at [facility], [place] [address], [place], [place] [address]    Report Status 08/29/2020 FINAL  Final  Blood Culture (routine x 2)     Status: None   Collection Time: 07/31/2020  6:10 PM   Specimen: BLOOD  Result Value Ref Range Status   Specimen Description BLOOD PORTA CATH  Final   Special  Requests   Final    BOTTLES DRAWN AEROBIC AND ANAEROBIC Blood Culture adequate volume   Culture   Final    NO GROWTH 5 DAYS Performed at [facility], [place] [address], [place], [place] [address]    Report Status 08/29/2020 FINAL  Final  MRSA Next Gen by PCR, Nasal     Status: None   Collection Time: 08/25/20  8:38 AM   Specimen: Nasal Mucosa; Nasal Swab  Result Value Ref Range Status   MRSA by PCR Next Gen NOT DETECTED NOT DETECTED Final    Comment: (NOTE) The GeneXpert MRSA Assay (FDA approved for NASAL specimens only), is one component of a comprehensive MRSA colonization surveillance program. It is not intended to diagnose MRSA infection nor to guide or monitor treatment for MRSA infections. Test performance is not FDA approved in patients less than 51 years old. Performed at [facility], [place] [address] [place], [place] [address]   Expectorated Sputum Assessment w Gram Stain, Rflx to Resp Cult     Status: None   Collection Time: 08/25/20 10:08 AM   Specimen: Expectorated Sputum  Result Value Ref Range Status   Specimen Description EXPECTORATED SPUTUM  Final   Special Requests NONE  Final   Sputum evaluation   Final    THIS SPECIMEN IS ACCEPTABLE FOR SPUTUM CULTURE Performed at [facility], [place] [address], [place], [place] [address]    Report Status 08/26/2020 FINAL  Final  Culture, Respiratory w Gram Stain     Status: None   Collection Time: 08/25/20 10:08 AM  Result Value Ref Range Status   Specimen Description EXPECTORATED SPUTUM  Final   Special Requests NONE Reflexed from [id number]  Final   Gram Stain   Final    RARE WBC PRESENT,BOTH PMN AND MONONUCLEAR RARE GRAM POSITIVE COCCI IN PAIRS    Culture   Final    RARE ENTEROCOCCUS FAECALIS NO STAPHYLOCOCCUS AUREUS ISOLATED No Pseudomonas species isolated Performed at [facility], [place] [address], [place], [place] [address]    Report Status 08/28/2020 FINAL  Final   Organism ID, Bacteria  ENTEROCOCCUS FAECALIS  Final      Susceptibility   Enterococcus faecalis - MIC*  AMPICILLIN <=2 SENSITIVE Sensitive     VANCOMYCIN 1 SENSITIVE Sensitive     GENTAMICIN SYNERGY SENSITIVE Sensitive     * RARE ENTEROCOCCUS FAECALIS          Radiology Studies: No results found.      Scheduled Meds:  amiodarone  400 mg Oral Daily   Followed by   Derrill Memo ON 09/09/2020] amiodarone  200 mg Oral Daily   budesonide (PULMICORT) nebulizer solution  0.5 mg Nebulization BID   buPROPion  300 mg Oral Daily   chlorhexidine  15 mL Mouth Rinse BID   Chlorhexidine Gluconate Cloth  6 each Topical Daily   diltiazem  240 mg Oral Daily   dronabinol  2.5 mg Oral BID AC   enoxaparin (LOVENOX) injection  40 mg Subcutaneous Q24H   feeding supplement  237 mL Oral TID BM   gabapentin  300 mg Oral TID   guaiFENesin  1,200 mg Oral BID   insulin aspart  0-15 Units Subcutaneous TID WC   insulin glargine-yfgn  10 Units Subcutaneous Daily   ipratropium  0.5 mg Nebulization TID   levalbuterol  0.63 mg Nebulization TID   lidocaine  2 patch Transdermal Q24H   loratadine  10 mg Oral Daily   mouth rinse  15 mL Mouth Rinse q12n4p   methylPREDNISolone (SOLU-MEDROL) injection  120 mg Intravenous Q24H   multivitamin with minerals  1 tablet Oral Daily   pantoprazole  40 mg Oral Daily   polyethylene glycol  17 g Oral Daily   sodium chloride flush  10-40 mL Intracatheter Q12H   sodium chloride flush  3 mL Intravenous Q12H   Continuous Infusions:  sodium chloride     ampicillin-sulbactam (UNASYN) IV Stopped (09/03/20 0417)     LOS: 10 days    Time spent: 75 minutes    Geradine Girt, DO [facility]   To contact the attending provider between 7A-7P or the covering provider during after hours 7P-7A, please log into the web site [email or web address] and access using universal [place] password for that web site. If you do not have the password, please call the hospital operator.  09/03/2020, 8:58 AM

## 2020-09-03 NOTE — Progress Notes (Signed)
Physical Therapy Discharge Patient Details Name: Grant Taylor MRN: [id number] DOB: March 07, 1945 Today's Date: 09/03/2020 Time:  -     Patient discharged from PT services secondary to medical decline - will need to re-order PT to resume therapy services.  Please see latest therapy progress note for current level of functioning and progress toward goals.    Progress and discharge plan discussed with RN who feels patient is not stable  to  mobilize from  bed at this time.  GP     Claretha Cooper 09/03/2020, 9:25 AM Tresa Endo PT Acute Rehabilitation Services Pager [phone] Office [phone]

## 2020-09-03 NOTE — Progress Notes (Signed)
Grant Taylor seems to doing pretty well right now.  He is would not complain of any nausea.  He does not complain of any pain.  He is still on the high flow oxygen.  Hopefully, this will be able to come down.  He completed his first cycle of chemotherapy yesterday.  I will start him on Neupogen tomorrow.  I really think we have to get Neupogen on board so that we will not have problems with infection risk.  His labs back so far show white count 20.7.  Hemoglobin 10.8.  Platelet count 228,000.    Again, the left supraclavicular lymph node will be a good marker for [place] to assess for response.  His lungs sound better on the right side.  There is been no obvious bleeding.  I know he is on anticoagulation at a prophylactic dose right now.  Looks like his heart monitor is in sinus rhythm.  I think he is on amiodarone.  I am not sure that he is eating all that much.    His vital signs show temperature of 98.9.  Pulse 77.  Blood pressure 151/83.  Oxygen saturation is 96%.  His lungs sound good on the left side.  On the right side he has better air movement.  He still has little bit of wheezing.  Cardiac exam regular rate and rhythm.  Abdomen is soft.  Bowel sounds are decreased.  He has no guarding or rebound tenderness.  Extremity shows some muscle atrophy in upper and lower extremities.  No obvious swelling or venous cords noted.  Neurological exam is nonfocal.  Grant Taylor has the metastatic small cell carcinoma.  He has had a first cycle of chemotherapy with carboplatinum/etoposide.  I will start Neupogen tomorrow.  Hopefully, he will be able to come out of the ICU soon.  I do appreciate the outstanding care he is getting from all the staff in the ICU.  Lattie Haw, MD  Proverbs 12:14

## 2020-09-03 NOTE — Progress Notes (Signed)
Post breathing treatment patients SATs holding at 87-89%, despite turning the flow to 40 Lpm HFNC. Placed patient on BiPAP at this time. SATs 95%. Will continue to monitor.

## 2020-09-03 NOTE — TOC Progression Note (Signed)
Transition of Care [facility]) - Progression Note    Patient Details  Name: Grant Taylor MRN: [id number] Date of Birth: 01/01/1946  Transition of Care [facility]) CM/SW Contact  Leeroy Cha, RN Phone Number: 09/03/2020, 1:42 PM  Clinical Narrative:    [phone] He does live alone and the daughter is out of state.   Expected Discharge Plan: [place] Barriers to Discharge: Continued Medical Work up  Expected Discharge Plan and Services Expected Discharge Plan: [place]   Discharge Planning Services: CM Consult Post Acute Care Choice: Durable Medical Equipment, Home Health Living arrangements for the past 2 months: Single Family Home                                       Social Determinants of Health (SDOH) Interventions    Readmission Risk Interventions No flowsheet data found.

## 2020-09-03 NOTE — Progress Notes (Addendum)
Nutrition Follow-up  DOCUMENTATION CODES:   Severe malnutrition in context of chronic illness  INTERVENTION:  - continue Ensure Plus TID, each supplement provides 350 kcal and 13 grams protein.    NUTRITION DIAGNOSIS:   Severe Malnutrition related to chronic illness (metastatic cancer) as evidenced by severe fat depletion, severe muscle depletion. -ongoing, slowly improving  GOAL:   Patient will meet greater than or equal to 90% of their needs -met  MONITOR:   PO intake, Supplement acceptance, Labs, Weight trends, I & O's  ASSESSMENT:   Pt readmitted for acute hypoxic respiratory failure 2/2 COPD exacerbation vs PNA vs worsening metastatic cancer/lymphangitic spread of cancer. 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  He has been eating mainly 75-100% since dinner on 8/2. Able to talk to RN prior to visiting patient. RN reported that patient has been requiring BiPAP at night. She had patient yesterday and he had applesauce for lunch but ate 100% of a large dinner. Reports he enjoys strawberry-flavored Ensure. RN reports patient ate 75% of breakfast this AM without issue.  Patient laying in bed with no family or visitors present. He reports that he has a good appetite, has been eating well, and confirms that he enjoys strawberry Ensure. Reports he also greatly enjoys applesauce. He denies any difficulty with chewing or swallowing. He denies any nutrition-related questions or concerns at this time. He has been accepting all bottles of Ensure Plus offered to him.   He has not been weighed since 8/4. No information documented in the edema section of flow sheet.   Per MD note today, patient is from home and  discharge plan/location is unknown at this time.    Labs reviewed; CBGs: 111 and 249 mg/dl, BUN: 29 mg/dl, Ca: 8.7 mg/dl, LFTs elevated.  Medications reviewed; 2.5 mg marinol BID, sliding scale novolog, 10 units semglee/day, 60 mg solu-medrol/day, 1 tablet multivitamin with minerals/day, 40 mg oral protonix/day, 17 g miralax/day.   Diet Order:   Diet Order             DIET SOFT Room service appropriate? Yes; Fluid consistency: Thin  Diet effective now                   EDUCATION NEEDS:   No education needs have been identified at this time  Skin:  Skin Assessment: Reviewed RN Assessment  Last BM:  7/31  Height:   Ht Readings from Last 1 Encounters:  08/29/20 $RemoveB'5\' 7"'VzLhuryu$  (1.702 m)    Weight:   Wt Readings from Last 1 Encounters:  08/29/20 58.8 kg     Estimated Nutritional Needs:  Kcal:  1950-2150 Protein:  95-110 grams Fluid:  >2L     Jarome Matin, MS, RD, LDN, CNSC Inpatient Clinical Dietitian RD pager # available in AMION  After hours/weekend pager # available in [facility]

## 2020-09-04 ENCOUNTER — Inpatient Hospital Stay (HOSPITAL_COMMUNITY): Payer: No Typology Code available for payment source

## 2020-09-04 DIAGNOSIS — J9621 Acute and chronic respiratory failure with hypoxia: Secondary | ICD-10-CM | POA: Diagnosis not present

## 2020-09-04 LAB — COMPREHENSIVE METABOLIC PANEL
ALT: 54 U/L — ABNORMAL HIGH (ref 0–44)
AST: 74 U/L — ABNORMAL HIGH (ref 15–41)
Albumin: 2 g/dL — ABNORMAL LOW (ref 3.5–5.0)
Alkaline Phosphatase: 85 U/L (ref 38–126)
Anion gap: 6 (ref 5–15)
BUN: 26 mg/dL — ABNORMAL HIGH (ref 8–23)
CO2: 28 mmol/L (ref 22–32)
Calcium: 8.6 mg/dL — ABNORMAL LOW (ref 8.9–10.3)
Chloride: 105 mmol/L (ref 98–111)
Creatinine, Ser: 0.77 mg/dL (ref 0.61–1.24)
GFR, Estimated: >60 mL/min (ref >60)
Glucose, Bld: 139 mg/dL — ABNORMAL HIGH (ref 70–99)
Potassium: 4 mmol/L (ref 3.5–5.1)
Sodium: 139 mmol/L (ref 135–145)
Total Bilirubin: 0.3 mg/dL (ref 0.3–1.2)
Total Protein: 5.4 g/dL — ABNORMAL LOW (ref 6.5–8.1)

## 2020-09-04 LAB — CBC WITH DIFFERENTIAL/PLATELET
Abs Immature Granulocytes: 0.23 10*3/uL — ABNORMAL HIGH (ref 0.00–0.07)
Basophils Absolute: 0 10*3/uL (ref 0.0–0.1)
Basophils Relative: 0 %
Eosinophils Absolute: 0 10*3/uL (ref 0.0–0.5)
Eosinophils Relative: 0 %
HCT: 33.5 % — ABNORMAL LOW (ref 39.0–52.0)
Hemoglobin: 10.5 g/dL — ABNORMAL LOW (ref 13.0–17.0)
Immature Granulocytes: 1 %
Lymphocytes Relative: 1 %
Lymphs Abs: 0.1 10*3/uL — ABNORMAL LOW (ref 0.7–4.0)
MCH: 26.6 pg (ref 26.0–34.0)
MCHC: 31.3 g/dL (ref 30.0–36.0)
MCV: 85 fL (ref 80.0–100.0)
Monocytes Absolute: 0 10*3/uL — ABNORMAL LOW (ref 0.1–1.0)
Monocytes Relative: 0 %
Neutro Abs: 18.8 10*3/uL — ABNORMAL HIGH (ref 1.7–7.7)
Neutrophils Relative %: 98 %
Platelets: 212 10*3/uL (ref 150–400)
RBC: 3.94 MIL/uL — ABNORMAL LOW (ref 4.22–5.81)
RDW: 17.9 % — ABNORMAL HIGH (ref 11.5–15.5)
WBC: 19.2 10*3/uL — ABNORMAL HIGH (ref 4.0–10.5)
nRBC: 0 % (ref 0.0–0.2)

## 2020-09-04 LAB — GLUCOSE, CAPILLARY
Glucose-Capillary: 104 mg/dL — ABNORMAL HIGH (ref 70–99)
Glucose-Capillary: 257 mg/dL — ABNORMAL HIGH (ref 70–99)
Glucose-Capillary: 196 mg/dL — ABNORMAL HIGH (ref 70–99)
Glucose-Capillary: 95 mg/dL (ref 70–99)

## 2020-09-04 MED ORDER — HYDROMORPHONE HCL 1 MG/ML IJ SOLN
0.5000 mg | INTRAMUSCULAR | Status: DC | PRN
  Administered 2020-09-04 – 2020-09-05 (×4): 1 mg via INTRAVENOUS
  Filled 2020-09-04 (×4): qty 1

## 2020-09-04 MED ORDER — INSULIN GLARGINE-YFGN 100 UNIT/ML ~~LOC~~ SOLN
8.0000 [IU] | Freq: Every day | SUBCUTANEOUS | Status: DC
  Administered 2020-09-04: 8 [IU] via SUBCUTANEOUS
  Filled 2020-09-04 (×2): qty 0.08

## 2020-09-04 MED ORDER — FUROSEMIDE 10 MG/ML IJ SOLN
20.0000 mg | Freq: Once | INTRAMUSCULAR | Status: AC
  Administered 2020-09-04: 20 mg via INTRAVENOUS
  Filled 2020-09-04: qty 2

## 2020-09-04 NOTE — Progress Notes (Signed)
Patient was taken off BIPAP and place on heated high flow. 40L 100%. He is tolerating well at this time with no distress. RT will continue to monitor

## 2020-09-04 NOTE — Progress Notes (Signed)
So far, Grant Taylor is doing okay.  He tolerated chemotherapy well.  His white cell count is still not too bad.  I will still hold off on Neupogen on him.  His right left lung field sounds better.  It would be nice to get a chest x-ray to see how the infiltrates look over on the right side.  He is still on high flow oxygen.  Hopefully this can also be changed to make life limit easier for him.  His white cell count is 19.2.  Hemoglobin 10.5.  Platelet count 212,000.  His BUN is 26 creatinine 0.77.  His albumin is 2.0.  His LFTs are slowly improving.  His vital signs all look stable.  Temperature 98.3.  Pulse 97.  Blood pressure 164/91.  His lungs sound good on the left side.  He has better breath sounds on the right side.  Cardiac exam regular rate and rhythm.  Abdomen is soft.  He has no guarding or rebound tenderness.  Extremities shows no clubbing, cyanosis or edema.  He does have some symmetric muscle atrophy.  Neurological exam is nonfocal.  In the left supraclavicular area he still has the large node.  I am just happy that Grant Taylor is doing well.  I just hope that he can get off the BiPAP.  Again, I think the left supraclavicular lymph node will be our guide as to whether or not he is responding.  I will still hold off on the Neupogen for him.  I do appreciate the outstanding care that he is getting from the staff in the ICU.  Grant Haw, MD  Psalm 5:12

## 2020-09-04 NOTE — Progress Notes (Signed)
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   Patient presented with worsening hypoxic respiratory failure, shortness of breath.  CT angio showed interval development of patchy consolidative opacities throughout the right lung which may represent superimposed infectious process or potentially lymphangitic spread of tumor.  Negative for PE.  Bulky mediastinal adenopathy narrows the right lower lobe pulmonary artery.  Interval increase in size of bulky mediastinal and hilar adenopathy as well as multiple bilateral pulmonary metastases.  Redemonstrated partially visualized large pancreatic mass. -Patient placed on IV antibiotics, oncology consulted patient was to start chemo however patient went to A. fib with RVR on 08/29/2020 transferred to the ICU placed on Cardizem drip and subsequently transition to amiodarone drip.  PCCM, cardiology consulted. -Patient subsequently converted to normal sinus rhythm, maintained on amiodarone drip with initiation of chemotherapy which was started 08/31/2020.  Patient on BiPAP nightly.     Assessment & Plan:   Principal Problem:   Acute on chronic respiratory failure with hypoxia (HCC) Active Problems:   Essential hypertension   COPD (chronic obstructive pulmonary disease) (HCC)   Pancreatic mass   Pneumonia   Protein-calorie malnutrition, severe   Small cell carcinoma (HCC)   Leukocytosis   Depression   New onset a-fib (HCC)    acute hypoxic respiratory failure likely multifactorial secondary to pneumonia in the setting of worsening  metastatic cancer/lymphangitic spread of cancer/COPD -increased O2 requirements when in a fib with RVR -Patient chronically on BiPAP at bedtime prior to admission. -heated high flow nasal cannula when not on Bipap -Urine strep pneumococcus antigen negative.  Urine Legionella antigen negative.  MRSA PCR negative. -Sputum with rare Enterococcus faecalis, no staph isolated. -IV Unasyn with stop date of 7 days placed -Continue scheduled nebs, Mucinex, Claritin, PPI, IV Solu-Medrol, Pulmicort nebs.  -wean steroids -Patient seen in consultation and being followed by oncology Dr. Marin Olp who discussed with patient's oncologist from [facility] and decision made to start inpatient chemotherapy -Patient was assessed by PCCM who have signed off as of 08/30/2020. -Oncology following.    New onset A. fib with RVR CHA2DS2VASC score 3 -Likely secondary to acute pulmonary issues  -Cardiac enzymes negative.   -Repeat 2D echo done 08/29/2020 with EF 60 to 65%,NWMA, normal mitral valve, no evidence of mitral stenosis, normal left atrial size. -Patient was on a Cardizem drip and amiodarone drip and subsequently converted back to normal sinus rhythm. -TSH within normal limits -Cardizem drip has been transitioned to oral Cardizem and dose being adjusted per cardiology. -per cards: Will transition from IV Amiodarone to PO Amiodarone - 400mg  daily for 1 week and can then reduce to 200mg  daily. -PCCM discontinued IV heparin due to hemoptysis and patient currently on prophylactic dose Lovenox. -Per cardiology may need to reassess need for anticoagulation close to discharge.  -Cardiology following and appreciate input and recommendations  Hypertension -BP initially noted to be soft and as such amlodipine initially held and was resumed on 08/28/2020 and subsequently discontinued on 08/29/2020 as patient noted to be in A. fib and was on a Cardizem and amiodarone drip. 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 -initiation of chemotherapy  08/31/2020.   -Patient seems to have tolerated chemotherapy okay.   -Per oncology.     Depression -Wellbutrin, hydroxyzine for anxiety.    Moderate protein calorie malnutrition in the setting of malignancy -Continue dronabinol -Continue nutritional supplementation with Ensure.    Hyperkalemia -treated   Leukocytosis -Patient on IV steroids likely contributing to leukocytosis. -Leukocytosis fluctuating  -Continue IV antibiotics secondary to problem #1.     Hyperglycemia -Secondary to IV steroids. -Continue Lantus 8 units daily, SSI.    Patient has been too weak to work with PT.  PT has signed off  I am concerned about his over-all appearance/weakness and his prognosis.    DVT prophylaxis: Lovenox Code Status: Full Family Communication:  No family at bedside. Disposition:   Status is: Inpatient  Remains inpatient appropriate because:Inpatient level of care appropriate due to severity of illness  Dispo: The patient is from: Home              Anticipated d/c is to: TBD              Patient currently is not medically stable to d/c.   Difficult to place patient No       Consultants:  Oncology: Dr. Marin Olp PCCM Cardiology  Procedures:  CT angiogram chest 08/23/2020 Chest x-ray 08/03/2020, 08/28/2020, 08/29/2020 2D echo 08/29/2020    Subjective: On bipap-- says last night he became choked on water (I asked the nurse to check for thrush when patient off of Bipap)  Objective: Vitals:   09/04/20 0730 09/04/20 0747 09/04/20 0752 09/04/20 0753  BP:      Pulse:    82  Resp:    18  Temp: 98 F (36.7 C)     TempSrc: Axillary     SpO2:  93% 93% 93%  Weight:      Height:        Intake/Output Summary (Last 24 hours) at  09/04/2020 0847 Last data filed at 09/04/2020 0700 Gross per 24 hour  Intake 1320.06 ml  Output 2375 ml  Net -1054.94 ml   Filed Weights   08/18/2020 1952 08/27/20 1559 08/29/20 0630  Weight: 61.2 kg 58.7 kg 58.8 kg    Examination:   General: Appearance:    Frail, ill appearing male in no acute distress     Lungs:     On bipap, no wheezing, respirations unlabored  Heart:    Normal heart rate.   MS:   All extremities are intact. Min LE edema around feet   Neurologic:   Awake, alert, oriented x 3. Weak appearing       Data Reviewed: I have personally reviewed following labs and imaging studies  CBC: Recent Labs  Lab 08/30/20 0255 08/31/20 0400 09/01/20 0500 09/02/20 0345 09/03/20 0640 09/04/20 0411  WBC 17.8* 22.4* 24.2* 26.4* 20.7* 19.2*  NEUTROABS 16.4* 21.2* 23.2*  --  20.2* 18.8*  HGB 11.0* 11.5* 11.4* 11.3* 10.8* 10.5*  HCT 35.1* 36.3* 36.2* 36.0* 34.3* 33.5*  MCV 83.2 84.2 83.6 84.1 84.5 85.0  PLT 339 340 269 258 228 017    Basic Metabolic Panel: Recent Labs  Lab 08/28/20 0919 08/29/20 0420 08/30/20 0255 08/31/20 0400 09/01/20 0500 09/02/20 0345 09/03/20 0640 09/04/20 0411  NA 138 137 142 142 140 141 140 139  K 4.0 4.4  3.6 4.2 3.8 3.8 3.8 4.0  CL 99 101 106 105 104 104 106 105  CO2 26 26 21* 28 28 28 30 28   GLUCOSE 173* 160* 135* 154* 139* 136* 118* 139*  BUN 19 23 29* 28* 27* 29* 29* 26*  CREATININE 0.94 1.01 1.08 0.92 0.92 0.85 0.92 0.77  CALCIUM 9.5 9.2 8.8* 8.8* 8.5* 8.9 8.7* 8.6*  MG 2.0 2.3 2.3  --  2.4 2.4  --   --   PHOS  --  3.5  --   --   --   --   --   --     GFR: Estimated Creatinine Clearance: 65.3 mL/min (by C-G formula based on SCr of 0.77 mg/dL).  Liver Function Tests: Recent Labs  Lab 08/31/20 0400 09/01/20 0500 09/02/20 0345 09/03/20 0640 09/04/20 0411  AST 99* 96* 94* 84* 74*  ALT 70* 68* 65* 59* 54*  ALKPHOS 100 90 84 86 85  BILITOT 0.3 0.5 0.3 0.4 0.3  PROT 6.2* 6.1* 5.9* 5.6* 5.4*  ALBUMIN 2.3* 2.2* 2.0* 2.0*  2.0*    CBG: Recent Labs  Lab 09/03/20 0747 09/03/20 1156 09/03/20 1658 09/03/20 2114 09/04/20 0745  GLUCAP 111* 249* 147* 88 104*     Recent Results (from the past 240 hour(s))  Expectorated Sputum Assessment w Gram Stain, Rflx to Resp Cult     Status: None   Collection Time: 08/25/20 10:08 AM   Specimen: Expectorated Sputum  Result Value Ref Range Status   Specimen Description EXPECTORATED SPUTUM  Final   Special Requests NONE  Final   Sputum evaluation   Final    THIS SPECIMEN IS ACCEPTABLE FOR SPUTUM CULTURE Performed at [facility], [address] [place], [place] [address]    Report Status 08/26/2020 FINAL  Final  Culture, Respiratory w Gram Stain     Status: None   Collection Time: 08/25/20 10:08 AM  Result Value Ref Range Status   Specimen Description EXPECTORATED SPUTUM  Final   Special Requests NONE Reflexed from [id number]  Final   Gram Stain   Final    RARE WBC PRESENT,BOTH PMN AND MONONUCLEAR RARE GRAM POSITIVE COCCI IN PAIRS    Culture   Final    RARE ENTEROCOCCUS FAECALIS NO STAPHYLOCOCCUS AUREUS ISOLATED No Pseudomonas species isolated Performed at [facility], [address], [place],  [address]    Report Status 08/28/2020 FINAL  Final   Organism ID, Bacteria ENTEROCOCCUS FAECALIS  Final      Susceptibility   Enterococcus faecalis - MIC*    AMPICILLIN <=2 SENSITIVE Sensitive     VANCOMYCIN 1 SENSITIVE Sensitive     GENTAMICIN SYNERGY SENSITIVE Sensitive     * RARE ENTEROCOCCUS FAECALIS          Radiology Studies: No results found.      Scheduled Meds:  amiodarone  400 mg Oral Daily   Followed by   Derrill Memo ON 09/09/2020] amiodarone  200 mg Oral Daily   budesonide (PULMICORT) nebulizer solution  0.5 mg Nebulization BID   buPROPion  300 mg Oral Daily   chlorhexidine  15 mL Mouth Rinse BID   Chlorhexidine Gluconate Cloth  6 each Topical Daily   diltiazem  240 mg Oral Daily   dronabinol  2.5 mg Oral BID AC    enoxaparin (LOVENOX) injection  40 mg Subcutaneous Q24H   feeding supplement  237 mL Oral TID BM   furosemide  20 mg Intravenous Once   gabapentin  300 mg Oral TID   guaiFENesin  1,200 mg Oral BID   insulin aspart  0-15 Units Subcutaneous TID WC   insulin glargine-yfgn  10 Units Subcutaneous Daily   ipratropium  0.5 mg Nebulization TID   levalbuterol  0.63 mg Nebulization TID   lidocaine  2 patch Transdermal Q24H   loratadine  10 mg Oral Daily   mouth rinse  15 mL Mouth Rinse q12n4p   methylPREDNISolone (SOLU-MEDROL) injection  60 mg Intravenous Q24H   multivitamin with minerals  1 tablet Oral Daily   pantoprazole  40 mg Oral Daily   polyethylene glycol  17 g Oral Daily   sodium chloride flush  10-40 mL Intracatheter Q12H   sodium chloride flush  3 mL Intravenous Q12H   Continuous Infusions:  sodium chloride     ampicillin-sulbactam (UNASYN) IV Stopped (09/04/20 0428)     LOS: 11 days    Time spent: 52 minutes    Geradine Girt, DO [facility]   To contact the attending provider between 7A-7P or the covering provider during after hours 7P-7A, please log into the web site [email or web address] and access using universal [place] password for that web site. If you do not have the password, please call the hospital operator.  09/04/2020, 8:47 AM

## 2020-09-05 DIAGNOSIS — J9621 Acute and chronic respiratory failure with hypoxia: Secondary | ICD-10-CM | POA: Diagnosis not present

## 2020-09-05 LAB — COMPREHENSIVE METABOLIC PANEL
ALT: 55 U/L — ABNORMAL HIGH (ref 0–44)
AST: 68 U/L — ABNORMAL HIGH (ref 15–41)
Albumin: 2 g/dL — ABNORMAL LOW (ref 3.5–5.0)
Alkaline Phosphatase: 100 U/L (ref 38–126)
Anion gap: 8 (ref 5–15)
BUN: 27 mg/dL — ABNORMAL HIGH (ref 8–23)
CO2: 29 mmol/L (ref 22–32)
Calcium: 8.8 mg/dL — ABNORMAL LOW (ref 8.9–10.3)
Chloride: 98 mmol/L (ref 98–111)
Creatinine, Ser: 0.78 mg/dL (ref 0.61–1.24)
GFR, Estimated: >60 mL/min (ref >60)
Glucose, Bld: 127 mg/dL — ABNORMAL HIGH (ref 70–99)
Potassium: 4.5 mmol/L (ref 3.5–5.1)
Sodium: 135 mmol/L (ref 135–145)
Total Bilirubin: 0.3 mg/dL (ref 0.3–1.2)
Total Protein: 5.5 g/dL — ABNORMAL LOW (ref 6.5–8.1)

## 2020-09-05 LAB — GLUCOSE, CAPILLARY
Glucose-Capillary: 117 mg/dL — ABNORMAL HIGH (ref 70–99)
Glucose-Capillary: 110 mg/dL — ABNORMAL HIGH (ref 70–99)
Glucose-Capillary: 184 mg/dL — ABNORMAL HIGH (ref 70–99)
Glucose-Capillary: 127 mg/dL — ABNORMAL HIGH (ref 70–99)

## 2020-09-05 LAB — CBC WITH DIFFERENTIAL/PLATELET
Abs Immature Granulocytes: 0.47 10*3/uL — ABNORMAL HIGH (ref 0.00–0.07)
Basophils Absolute: 0 10*3/uL (ref 0.0–0.1)
Basophils Relative: 0 %
Eosinophils Absolute: 0 10*3/uL (ref 0.0–0.5)
Eosinophils Relative: 0 %
HCT: 33.8 % — ABNORMAL LOW (ref 39.0–52.0)
Hemoglobin: 10.7 g/dL — ABNORMAL LOW (ref 13.0–17.0)
Immature Granulocytes: 3 %
Lymphocytes Relative: 1 %
Lymphs Abs: 0.2 10*3/uL — ABNORMAL LOW (ref 0.7–4.0)
MCH: 26.7 pg (ref 26.0–34.0)
MCHC: 31.7 g/dL (ref 30.0–36.0)
MCV: 84.3 fL (ref 80.0–100.0)
Monocytes Absolute: 0 10*3/uL — ABNORMAL LOW (ref 0.1–1.0)
Monocytes Relative: 0 %
Neutro Abs: 16.6 10*3/uL — ABNORMAL HIGH (ref 1.7–7.7)
Neutrophils Relative %: 96 %
Platelets: 186 10*3/uL (ref 150–400)
RBC: 4.01 MIL/uL — ABNORMAL LOW (ref 4.22–5.81)
RDW: 17.5 % — ABNORMAL HIGH (ref 11.5–15.5)
WBC: 17.3 10*3/uL — ABNORMAL HIGH (ref 4.0–10.5)
nRBC: 0 % (ref 0.0–0.2)

## 2020-09-05 MED ORDER — METOPROLOL TARTRATE 5 MG/5ML IV SOLN: 5.0000 mg | Freq: Three times a day (TID) | INTRAVENOUS | Status: DC

## 2020-09-05 MED ORDER — LIDOCAINE 5 % EX PTCH
3.0000 | MEDICATED_PATCH | CUTANEOUS | Status: DC
  Administered 2020-09-05 – 2020-09-10 (×6): 3 via TRANSDERMAL
  Filled 2020-09-05 (×6): qty 3

## 2020-09-05 MED ORDER — PROSOURCE PLUS PO LIQD
30.0000 mL | Freq: Three times a day (TID) | ORAL | Status: DC
  Administered 2020-09-05 – 2020-09-09 (×13): 30 mL via ORAL
  Filled 2020-09-05 (×13): qty 30

## 2020-09-05 MED ORDER — FUROSEMIDE 10 MG/ML IJ SOLN
20.0000 mg | Freq: Once | INTRAMUSCULAR | Status: AC
  Administered 2020-09-05: 20 mg via INTRAVENOUS
  Filled 2020-09-05: qty 2

## 2020-09-05 MED ORDER — BISACODYL 10 MG RE SUPP: 10.0000 mg | Freq: Every day | RECTAL | Status: DC | PRN

## 2020-09-05 NOTE — Evaluation (Signed)
Physical Therapy Evaluation Patient Details Name: Grant Taylor MRN: [id number] DOB: 12/29/1945 Today's Date: 09/05/2020   History of Present Illness  Pt is 75 yo male admitted with sepsis, acute resp failure due to PNE and mets to lung on 08/09/20.  Pt with hx of metastatic pancreatic CA and COPD.  Clinical Impression  Pt admitted with above diagnosis. Instructed pt in bed level BUE/LE strengthening exercises to minimize further deconditioning. SaO2 97-90% on 35L HFNC. Per RN request, mobility was deferred as pt desaturates with minimal activity.  Pt currently with functional limitations due to the deficits listed below (see PT Problem List). Pt will benefit from skilled PT to increase their independence and safety with mobility to allow discharge to the venue listed below.       Follow Up Recommendations SNF;Supervision for mobility/OOB;Supervision/Assistance - 24 hour    Equipment Recommendations  None recommended by PT    Recommendations for Other Services       Precautions / Restrictions Precautions Precautions: Fall Precaution Comments: prefers shoes; monitor O2 Restrictions Weight Bearing Restrictions: No      Mobility  Bed Mobility               General bed mobility comments: NT-per RN request 2* hypoxia with activity    Transfers                    Ambulation/Gait                Stairs            Wheelchair Mobility    Modified Rankin (Stroke Patients Only)       Balance                                             Pertinent Vitals/Pain Faces Pain Scale: Hurts little more Pain Location: flank and neck ("chronic" per pt) Pain Descriptors / Indicators: Discomfort;Sore Pain Intervention(s): Limited activity within patient's tolerance;Monitored during session    [place] expects to be discharged to:: Private residence Living Arrangements: Alone   Type of Home: Apartment Home  Access: Level entry     Home Layout: One level Home Equipment:  (R forearm crutch)      Prior Function Level of Independence: Independent with assistive device(s)         Comments: uses huricane inside home. forearm crutch outside of home     Hand Dominance        Extremity/Trunk Assessment   Upper Extremity Assessment Upper Extremity Assessment: Overall WFL for tasks assessed    Lower Extremity Assessment Lower Extremity Assessment: Generalized weakness    Cervical / Trunk Assessment Cervical / Trunk Assessment: Normal  Communication   Communication: No difficulties  Cognition Arousal/Alertness: Awake/alert Behavior During Therapy: WFL for tasks assessed/performed Overall Cognitive Status: Within Functional Limits for tasks assessed                                 General Comments: AxO x 3 pleasant      General Comments      Exercises General Exercises - Lower Extremity Ankle Circles/Pumps: AROM;Both;10 reps;Supine Quad Sets: AROM;Both;5 reps;Supine Gluteal Sets: AROM;Both;5 reps;Supine Heel Slides: AROM;Both;10 reps;Supine (with manual resistance from PT) Shoulder Exercises Shoulder Flexion: AROM;Both;10 reps;Supine   Assessment/Plan  PT Assessment Patient needs continued PT services  PT Problem List Decreased strength;Decreased mobility;Decreased activity tolerance;Decreased balance;Decreased knowledge of use of DME       PT Treatment Interventions Gait training;Therapeutic exercise;Balance training;Functional mobility training;Therapeutic activities;Patient/family education    PT Goals (Current goals can be found in the Care Plan section)  Acute Rehab PT Goals Patient Stated Goal: to get stronger PT Goal Formulation: With patient Time For Goal Achievement: 09/19/20 Potential to Achieve Goals: Fair    Frequency Min 2X/week   Barriers to discharge        Co-evaluation               AM-PAC PT "6 Clicks" Mobility   Outcome Measure Help needed turning from your back to your side while in a flat bed without using bedrails?: A Lot Help needed moving from lying on your back to sitting on the side of a flat bed without using bedrails?: A Lot Help needed moving to and from a bed to a chair (including a wheelchair)?: Total Help needed standing up from a chair using your arms (e.g., wheelchair or bedside chair)?: Total Help needed to walk in hospital room?: Total Help needed climbing 3-5 steps with a railing? : Total 6 Click Score: 8    End of Session Equipment Utilized During Treatment: Gait belt;Oxygen Activity Tolerance: Patient limited by fatigue Patient left: in bed;with bed alarm set Nurse Communication: Mobility status PT Visit Diagnosis: Difficulty in walking, not elsewhere classified (R26.2);Muscle weakness (generalized) (M62.81);Unsteadiness on feet (R26.81)    Time: 0881-1031 PT Time Calculation (min) (ACUTE ONLY): 12 min   Charges:   PT Evaluation $PT Eval Moderate Complexity: 1 Mod         Philomena Doheny PT 09/05/2020  Acute Rehabilitation Services Pager [phone] Office [phone]

## 2020-09-05 NOTE — Progress Notes (Signed)
Chaplain engaged in an initial visit with Grant Taylor who wanted prayer.  [name] voiced that he wanted prayer for his health and condition.  Chaplain offered prayer with [name], uplifting his health.  [name] later shared that he has been in a lot of pain.  When Chaplain asked if that was impacting his thoughts and spirit, he stated "yes" and the pain makes him very anxious.  Chaplain celebrated the ways [name] could speak to what he needs and what is happening to his body.  He was very clear that his pain level is within the 7-10 range and that he was having some discomfort around his neck.  [name] nurses did a great job of listening to his concerns.  Chaplain offered prayer, listening, support and presence.  [name] shared that he does have a daughter in the are to come and visit him.    09/05/20 1300  Clinical Encounter Type  Visited With Patient  Visit Type Spiritual support;Initial  Spiritual Encounters  Spiritual Needs Prayer

## 2020-09-05 NOTE — Progress Notes (Signed)
Patient was placed back on BIPAP. He was on heated high flow and was having low saturations and increased WOB. He is tolerating BIPAP well at this time with no distress. RT will continue to monitor patient Saturation in creased to 96% on BIPAP

## 2020-09-05 NOTE — Progress Notes (Signed)
The patient was taken off BIPAP and placed on Heated high flow 35L/ 100%. He current saturation is 92%. He is tolerating fair at this time with minimal distress. RT will continue to monitor

## 2020-09-05 NOTE — Progress Notes (Signed)
NAME:  Grant Taylor, MRN:  [id number], DOB:  09-25-1945, LOS: 51 ADMISSION DATE:  08/09/2020, CONSULTATION DATE:  08/29/20 REFERRING MD:  Grandville Silos  TRH, CHIEF COMPLAINT:  cough SOB  History of Present Illness:  75 yo M PMH COPD, agent orange exposure,  GERD, OSA on home BiPAP, and recently diagnosed poorly differentiated metastatic disease: large pancreatic mass with numerous lung mets and lymphangitic spread who was admitted 7/30 for SOB, cough. He was actually discharged 7/28 after hospitalization for hypoxia due to PNA which caused sepsis, and planned to follow up at [facility] this week but returned to the ED 7/30 with worse acute symptoms. At time of discharge, he was started on home O2 at [facility].  In ED, CTA chest without PE but did show new patchy consolidative opacities, and interval incr in size of lung mets.   He was admitted to [facility], started on vanc zosyn cefepime. Remained on vanc cefepime until 8/1 when he was changed to azithro flagyl cefepime, and on 8/3 he was changed to unasyn.  8/4 early AM the pt had new onset Afib RVR, associated diaphoresis and SOB. He was noted by Rapid to be hypoxic, have crackles. He received SL nitro x2, morphine. TRH to bedside and gave 556ml IVF bolus and 10mg  dilt IV, then started on dilt gtt and transferred to ICU.   PCCM consulted during this admission for respiratory distress, BiPAP Status did improve and PCCM signed off  Was asked to see him again today as he is unable to come off BiPAP  Was started on chemotherapy, tolerated first treatment well  Pertinent  Medical History  COPD OSA  Agent orange exposure HLD GERD BPH Poorly differentiated metastatic disease: pancreatic tumor, numerous lung tumors, lymphangitic spread   Significant Hospital Events: Including procedures, antibiotic start and stop dates in addition to other pertinent events   7/30 admitted to [facility] with SOB. CTA neg for PE. Incr size of lung masses. PNA vs lymphangitic  spread. Started on vanc cefepime zosyn 7/31 vanc cefepime. Oncology consulted  8/1 azithro cefepime flagyl 8/3 unasyn 8/4 new Afib RVR, worse rep distress and hypoxia, on BiPAP, transferred to ICU. PCCM consulted  8/5 I took him off bipap, trial of HFNC. Hopefully starting chemo. Converted to sinus 8/4 evening after starting low dose metop  On BiPAP  Interim History / Subjective:   In sinus rhythm Just generally tired Denies any pain or discomfort  Objective   Blood pressure (!) 156/90, pulse 88, temperature 98.6 F (37 C), temperature source Oral, resp. rate 10, height 5\' 7"  (1.702 m), weight 58.8 kg, SpO2 92 %.    FiO2 (%):  [80 %-100 %] 80 %   Intake/Output Summary (Last 24 hours) at 09/05/2020 1234 Last data filed at 09/05/2020 1149 Gross per 24 hour  Intake 263 ml  Output 525 ml  Net -262 ml   Filed Weights   08/19/2020 1952 08/27/20 1559 08/29/20 0630  Weight: 61.2 kg 58.7 kg 58.8 kg    Examination: Elderly gentleman, does not appear to be in acute distress, very frail HENT: BiPAP mask in place Lungs: Decreased air movement bilaterally Cardiovascular: S1-S2 appreciated Abdomen: Soft, bowel sounds appreciated Extremities: No clubbing, no edema Neuro: AAOx4 following commands  GU: defer  Psych: Calm and appropriate Resolved Hospital Problem list     Assessment & Plan:   Acute hypoxemic respiratory failure recovering from multilobar pneumonia Metastatic cancer Small cell lung cancer Lymphangitic spread of cancer in the lungs Atrial fibrillation Essential  hypertension Chronic obstructive pulm disease Pancreatic mass Protein calorie malnutrition Diabetes Depression History of agent orange exposure  Patient with very limited reserves Cautious diuresis Complete course of antibiotics Continue weaning steroids as tolerated  Patient is currently being appropriately treated with steroids, antibiotics, nebulization treatments Current decompensation on top of  limited pulmonary reserves  No changes to current care Continue lines of treatment Will try to wean off BiPAP, high flow nasal cannula during daytime  Sherrilyn Rist, MD [place] PCCM Pager: See Shea Evans

## 2020-09-05 NOTE — Progress Notes (Signed)
Notified MD Vann this morning approximately around 0900 in regards to patient requiring more oxygen via BIPAP. Patient's FIO2 was 80% via BIPAP. MD Eliseo Squires ordered 20mg  of lasix and re-consulted PCCM. Due to patient's respiratory status this morning, we had to hold morning medications. Patient was able to take morning medications this afternoon after patient was able to tolerate Heated HFNC. Patient not eating well, nutritional status is poor. MD Eliseo Squires ordered to hold the long acting insulin for now. Will continue to monitor and assess patient.

## 2020-09-05 NOTE — Progress Notes (Signed)
Overall, Grant Taylor is about the same.  He still is on the BiPAP.  I think maybe during the daytime he goes on to nasal cannula.  He is not complaining of any pain.  He still is in normal sinus rhythm.  His oxygen saturation is 95%.  He has had no nausea.  Overall, he tolerated treatment quite nicely.  He is now 3 days post his first cycle of carboplatinum/etoposide.  I did get back the molecular markers that were done at the [facility] hospital.  There is no targetable mutations on his malignancy.  As such, systemic chemotherapy and at some point immunotherapy would be the way to go.  His labs show white count of 17.3.  Hemoglobin 10.7.  Platelet count 186,000.  His BUN is 27 creatinine 0.78.  Calcium is 8.8.  His albumin is only 2.  They really would be nice to try to get his appetite better.  I know it is hard for him to eat with his BiPAP on.  He did have a chest x-ray yesterday.  This was really about the same with the bilateral infiltrates.  The nodule in the left supraclavicular region might be a little bit less prominent.  He has had no bleeding.  His vital signs are temperature of 98.1.  Pulse 78.  Blood pressure 143/90.  His lungs still sound good on the left side.  He does have decent breath sounds on the right side.  I hear no wheezing.  Cardiac exam regular rate and rhythm.  Abdomen is soft.  He has good bowel sounds.  There is no fluid wave.  He has no abdominal mass.  Extremity shows no clubbing, cyanosis or edema.  Neurological exam is nonfocal.  Again, we does have to wait and see how the treatment works.  Again, the left supraclavicular nodule can be our marker for response.  Hopefully, he will be able to stay on nasal cannula for his oxygen needs.  I do appreciate the incredibly hard work that has been done by the staff in the ICU and taking care of Mr. Kowalke.  Lattie Haw, MD  Psalm 5:12

## 2020-09-05 NOTE — Progress Notes (Signed)
NUTRITION NOTE  Consult received for Poor PO Intake. Last full follow-up was on 8/9 at which time patient was eating 100% at meals and drinking 100% of Ensure Plus TID (each bottle provides 350 kcal and 13 grams protein).   Yesterday these intakes continued. Able to talk with MD via secure chat who reports that RN informed her patient is consuming 0% of meals and that respiratory status is a contributing factor.  RN at bedside at the time of visit. Patient laying in bed with no family or visitors present.   Patient was on BiPAP overnight until ~1 hour ago. He was unable to consume breakfast due to this. He does not want lunch but is willing to drink a strawberry Ensure Plus.   He is experiencing severe R-sided neck pain d/t cold air via HFNC hitting sinus cavity on this side. Heating pad has been applied to the area.   Will order 30 ml Prosource Plus TID, each supplement provides 100 kcal and 15 grams protein.  RD will continue to follow per protocol.     Jarome Matin, MS, RD, LDN, CNSC Inpatient Clinical Dietitian RD pager # available in [facility]  After hours/weekend pager # available in [facility]

## 2020-09-05 NOTE — Progress Notes (Signed)
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   Patient presented with worsening hypoxic respiratory failure, shortness of breath.  CT angio showed interval development of patchy consolidative opacities throughout the right lung which may represent superimposed infectious process or potentially lymphangitic spread of tumor.  Negative for PE.  Bulky mediastinal adenopathy narrows the right lower lobe pulmonary artery.  Interval increase in size of bulky mediastinal and hilar adenopathy as well as multiple bilateral pulmonary metastases.  Redemonstrated partially visualized large pancreatic mass. -Patient placed on IV antibiotics, oncology consulted patient was to start chemo however patient went to A. fib with RVR on 08/29/2020 transferred to the ICU placed on Cardizem drip and subsequently transition to amiodarone drip.  PCCM, cardiology consulted. -Patient subsequently converted to normal sinus rhythm, maintained on amiodarone drip with initiation of chemotherapy which was started 08/31/2020.  Patient on BiPAP nightly.     Assessment & Plan:   Principal Problem:   Acute on chronic respiratory failure with hypoxia (HCC) Active Problems:   Essential hypertension   COPD (chronic obstructive pulmonary disease) (HCC)   Pancreatic mass   Pneumonia   Protein-calorie malnutrition, severe   Small cell carcinoma (HCC)   Leukocytosis   Depression   New onset a-fib (HCC)    acute hypoxic respiratory failure likely multifactorial secondary to pneumonia in the setting of worsening  metastatic cancer/lymphangitic spread of cancer/COPD -increased O2 requirements when in a fib with RVR -Patient chronically on BiPAP at bedtime prior to admission. -heated high flow nasal cannula when not on Bipap -Urine strep pneumococcus antigen negative.  Urine Legionella antigen negative.  MRSA PCR negative. -Sputum with rare Enterococcus faecalis, no staph isolated. -IV Unasyn with stop date of 7 days placed -Continue scheduled nebs, Mucinex, Claritin, PPI, IV Solu-Medrol, Pulmicort nebs.  -wean steroids -Patient seen in consultation and being followed by oncology Dr. Marin Olp who discussed with patient's oncologist from [facility] and decision made to start inpatient chemotherapy -IV lasix with some improvement per nursing -PCCM re consulted -Oncology following.    New onset A. fib with RVR CHA2DS2VASC score 3 -Likely secondary to acute pulmonary issues  -Cardiac enzymes negative.   -Repeat 2D echo done 08/29/2020 with EF 60 to 65%,NWMA, normal mitral valve, no evidence of mitral stenosis, normal left atrial size. -Patient was on a Cardizem drip and amiodarone drip and subsequently converted back to normal sinus rhythm. -TSH within normal limits -Cardizem drip has been transitioned to oral Cardizem and dose being adjusted per cardiology. -per cards: Will transition from IV Amiodarone to PO Amiodarone - 400mg  daily for 1 week and can then reduce to 200mg  daily. -PCCM discontinued IV heparin due to hemoptysis and patient currently on prophylactic dose Lovenox. -Per cardiology may need to reassess need for anticoagulation close to discharge.  -Cardiology following and appreciate input and recommendations  Hypertension -BP initially noted to be soft and as such amlodipine initially held and was resumed on 08/28/2020 and subsequently discontinued on 08/29/2020 as patient noted to be in A. fib and was on a Cardizem and amiodarone drip. 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 -initiation of chemotherapy  08/31/2020.   -Patient seems to have tolerated chemotherapy okay.   -Per oncology.     Depression -Wellbutrin, hydroxyzine for anxiety.    Moderate protein calorie malnutrition in the setting of malignancy -Continue dronabinol -Continue nutritional supplementation with Ensure.    Hyperkalemia -treated   Leukocytosis -Patient on IV steroids likely contributing to leukocytosis. -Leukocytosis fluctuating  -Continue IV antibiotics secondary to problem #1.     Hyperglycemia -Secondary to IV steroids. -Continue Lantus 8 units daily, SSI.    Aggressive PT  DVT prophylaxis: Lovenox Code Status: Full Family Communication:  No family at bedside. Disposition:   Status is: Inpatient  Remains inpatient appropriate because:Inpatient level of care appropriate due to severity of illness  Dispo: The patient is from: Home              Anticipated d/c is to: TBD              Patient currently is not medically stable to d/c.   Difficult to place patient No       Consultants:  Oncology: Dr. Marin Olp PCCM Cardiology  Procedures:  CT angiogram chest 07/28/2020 Chest x-ray 07/31/2020, 08/28/2020, 08/29/2020 2D echo 08/29/2020    Subjective: Overnight had issues with his oxygen levels dropping -gave IV lasix and nurses reports improved  Objective: Vitals:   09/05/20 1000 09/05/20 1100 09/05/20 1129 09/05/20 1200  BP: (!) 154/102 (!) 151/104    Pulse: 89 96  98  Resp: 16 (!) 24  (!) 22  Temp:    98.6 F (37 C)  TempSrc:    Oral  SpO2: 95% 92% 92% 91%  Weight:      Height:        Intake/Output Summary (Last 24 hours) at 09/05/2020 1353 Last data filed at 09/05/2020 1200 Gross per 24 hour  Intake 503 ml  Output 525 ml  Net -22 ml    Filed Weights   08/07/2020 1952 08/27/20 1559 08/29/20 0630  Weight: 61.2 kg 58.7 kg 58.8 kg    Examination:   General: Appearance:    Frail male on bipap     Lungs:     Diminished but no wheezing, respirations unlabored  Heart:    Normal heart rate.   MS:   All extremities are intact.    Neurologic:   Awake, alert, oriented x 3. No apparent focal neurological           defect.          Data Reviewed: I have personally reviewed following labs and imaging studies  CBC: Recent Labs  Lab 08/31/20 0400 09/01/20 0500 09/02/20 0345 09/03/20 0640 09/04/20 0411 09/05/20 0427  WBC 22.4* 24.2* 26.4* 20.7* 19.2* 17.3*  NEUTROABS 21.2* 23.2*  --  20.2* 18.8* 16.6*  HGB 11.5* 11.4* 11.3* 10.8* 10.5* 10.7*  HCT 36.3* 36.2* 36.0* 34.3* 33.5* 33.8*  MCV 84.2 83.6 84.1 84.5 85.0 84.3  PLT 340 269 258 228 212 001    Basic Metabolic Panel: Recent Labs  Lab 08/30/20 0255 08/31/20 0400 09/01/20 0500 09/02/20 0345 09/03/20 0640 09/04/20 0411 09/05/20 0427  NA 142   < > 140 141 140 139 135  K 3.6   < > 3.8 3.8 3.8 4.0 4.5  CL 106   < > 104 104 106 105 98  CO2 21*   < >  28 28 30 28 29   GLUCOSE 135*   < > 139* 136* 118* 139* 127*  BUN 29*   < > 27* 29* 29* 26* 27*  CREATININE 1.08   < > 0.92 0.85 0.92 0.77 0.78  CALCIUM 8.8*   < > 8.5* 8.9 8.7* 8.6* 8.8*  MG 2.3  --  2.4 2.4  --   --   --    < > = values in this interval not displayed.    GFR: Estimated Creatinine Clearance: 65.3 mL/min (by C-G formula based on SCr of 0.78 mg/dL).  Liver Function Tests: Recent Labs  Lab 09/01/20 0500 09/02/20 0345 09/03/20 0640 09/04/20 0411 09/05/20 0427  AST 96* 94* 84* 74* 68*  ALT 68* 65* 59* 54* 55*  ALKPHOS 90 84 86 85 100  BILITOT 0.5 0.3 0.4 0.3 0.3  PROT 6.1* 5.9* 5.6* 5.4* 5.5*  ALBUMIN 2.2* 2.0* 2.0* 2.0* 2.0*    CBG: Recent Labs  Lab 09/04/20 1141 09/04/20 1612 09/04/20 2146 09/05/20 0740 09/05/20 1149  GLUCAP 257* 196* 95 117* 110*     No results found  for this or any previous visit (from the past 240 hour(s)).         Radiology Studies: DG CHEST PORT 1 VIEW  Result Date: 09/04/2020 CLINICAL DATA:  Short of breath.  Lung cancer history. EXAM: PORTABLE CHEST 1 VIEW COMPARISON:  08/31/2020 FINDINGS: Bilateral lung infiltrates right greater than left appear unchanged. Bilateral nodules also unchanged. Small left pleural effusion is progressive. Port-A-Cath tip SVC. IMPRESSION: Bilateral airspace disease and bilateral lung nodules unchanged. Progression of small left pleural effusion. Electronically Signed   By: Franchot Gallo M.D.   On: 09/04/2020 15:40        Scheduled Meds:  (feeding supplement) PROSource Plus  30 mL Oral TID BM   amiodarone  400 mg Oral Daily   Followed by   Derrill Memo ON 09/09/2020] amiodarone  200 mg Oral Daily   budesonide (PULMICORT) nebulizer solution  0.5 mg Nebulization BID   buPROPion  300 mg Oral Daily   chlorhexidine  15 mL Mouth Rinse BID   Chlorhexidine Gluconate Cloth  6 each Topical Daily   diltiazem  240 mg Oral Daily   dronabinol  2.5 mg Oral BID AC   enoxaparin (LOVENOX) injection  40 mg Subcutaneous Q24H   feeding supplement  237 mL Oral TID BM   gabapentin  300 mg Oral TID   guaiFENesin  1,200 mg Oral BID   insulin aspart  0-15 Units Subcutaneous TID WC   ipratropium  0.5 mg Nebulization TID   levalbuterol  0.63 mg Nebulization TID   lidocaine  3 patch Transdermal Q24H   loratadine  10 mg Oral Daily   mouth rinse  15 mL Mouth Rinse q12n4p   methylPREDNISolone (SOLU-MEDROL) injection  60 mg Intravenous Q24H   multivitamin with minerals  1 tablet Oral Daily   pantoprazole  40 mg Oral Daily   polyethylene glycol  17 g Oral Daily   sodium chloride flush  10-40 mL Intracatheter Q12H   sodium chloride flush  3 mL Intravenous Q12H   Continuous Infusions:  sodium chloride       LOS: 12 days    Time spent: 45 minutes    Geradine Girt, DO [facility]   To contact the attending  provider between 7A-7P or the covering provider during after hours 7P-7A, please log into the web site [email or web address] and access using universal [place] password for  that web site. If you do not have the password, please call the hospital operator.  09/05/2020, 1:53 PM

## 2020-09-06 DIAGNOSIS — J9621 Acute and chronic respiratory failure with hypoxia: Secondary | ICD-10-CM | POA: Diagnosis not present

## 2020-09-06 LAB — CBC WITH DIFFERENTIAL/PLATELET
Abs Immature Granulocytes: 0.34 10*3/uL — ABNORMAL HIGH (ref 0.00–0.07)
Basophils Absolute: 0 10*3/uL (ref 0.0–0.1)
Basophils Relative: 0 %
Eosinophils Absolute: 0 10*3/uL (ref 0.0–0.5)
Eosinophils Relative: 0 %
HCT: 32.7 % — ABNORMAL LOW (ref 39.0–52.0)
Hemoglobin: 10.4 g/dL — ABNORMAL LOW (ref 13.0–17.0)
Immature Granulocytes: 3 %
Lymphocytes Relative: 1 %
Lymphs Abs: 0.2 10*3/uL — ABNORMAL LOW (ref 0.7–4.0)
MCH: 26.7 pg (ref 26.0–34.0)
MCHC: 31.8 g/dL (ref 30.0–36.0)
MCV: 83.8 fL (ref 80.0–100.0)
Monocytes Absolute: 0 10*3/uL — ABNORMAL LOW (ref 0.1–1.0)
Monocytes Relative: 0 %
Neutro Abs: 11 10*3/uL — ABNORMAL HIGH (ref 1.7–7.7)
Neutrophils Relative %: 96 %
Platelets: 159 10*3/uL (ref 150–400)
RBC: 3.9 MIL/uL — ABNORMAL LOW (ref 4.22–5.81)
RDW: 17.3 % — ABNORMAL HIGH (ref 11.5–15.5)
WBC: 11.5 10*3/uL — ABNORMAL HIGH (ref 4.0–10.5)
nRBC: 0 % (ref 0.0–0.2)

## 2020-09-06 LAB — COMPREHENSIVE METABOLIC PANEL
ALT: 55 U/L — ABNORMAL HIGH (ref 0–44)
AST: 52 U/L — ABNORMAL HIGH (ref 15–41)
Albumin: 1.9 g/dL — ABNORMAL LOW (ref 3.5–5.0)
Alkaline Phosphatase: 89 U/L (ref 38–126)
Anion gap: 9 (ref 5–15)
BUN: 31 mg/dL — ABNORMAL HIGH (ref 8–23)
CO2: 31 mmol/L (ref 22–32)
Calcium: 8.6 mg/dL — ABNORMAL LOW (ref 8.9–10.3)
Chloride: 98 mmol/L (ref 98–111)
Creatinine, Ser: 0.98 mg/dL (ref 0.61–1.24)
GFR, Estimated: >60 mL/min (ref >60)
Glucose, Bld: 191 mg/dL — ABNORMAL HIGH (ref 70–99)
Potassium: 4.5 mmol/L (ref 3.5–5.1)
Sodium: 138 mmol/L (ref 135–145)
Total Bilirubin: 0.6 mg/dL (ref 0.3–1.2)
Total Protein: 5.2 g/dL — ABNORMAL LOW (ref 6.5–8.1)

## 2020-09-06 LAB — GLUCOSE, CAPILLARY
Glucose-Capillary: 170 mg/dL — ABNORMAL HIGH (ref 70–99)
Glucose-Capillary: 343 mg/dL — ABNORMAL HIGH (ref 70–99)
Glucose-Capillary: 167 mg/dL — ABNORMAL HIGH (ref 70–99)
Glucose-Capillary: 232 mg/dL — ABNORMAL HIGH (ref 70–99)

## 2020-09-06 MED ORDER — DICLOFENAC SODIUM 1 % EX GEL: 2.0000 g | Freq: Four times a day (QID) | CUTANEOUS | Status: DC

## 2020-09-06 NOTE — Progress Notes (Signed)
Mr. Zeman is about the same.  He still is on the BiPAP.  If it would be really nice to be able to get him on nasal cannula oxygen.  I am not sure why this is taking so long.  I had believe that this is happened to do with his underlying malignancy.  He now is 4 days out from completing his first cycle of treatment.  His white cell count is trending downward.  I still would not put him on any Neupogen.  I really hope that we can get more nutrition and.  I know he is taking some supplements.  He is still in normal sinus rhythm.  He still having some hemoptysis.  Does not sound like is all that bad.  His labs show white cell count 11.5.  Hemoglobin 10.4.  Platelet count 159,000.  His BUN is 31 creatinine 0.98.  His albumin is only 1.9.  His vital signs are temperature 97.7.  Pulse 85.  Blood pressure 126/79.  Oxygen saturation is 98% on BiPAP.  Overall, there is really no change in his physical exam.  His lungs still sound pretty good.  He has good air movement bilaterally.  Cardiac exam regular rate and rhythm.  The left supraclavicular nodule is about the same size.  Mr. Soter has had his first cycle of chemotherapy for small cell carcinoma.  His white cell count is starting to trend downward.  Again, we will hold on Neupogen.  He does not need any transfusions.  I think his pulmonary status is really got dictate his hospitalization.  I do appreciate the hard work and compassion that the staff in the ICU have for all of our patients.  Lattie Haw, MD  Psalm 34:8

## 2020-09-06 NOTE — Progress Notes (Addendum)
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   Patient presented with worsening hypoxic respiratory failure, shortness of breath.  CT angio showed interval development of patchy consolidative opacities throughout the right lung which may represent superimposed infectious process or potentially lymphangitic spread of tumor.  Negative for PE.  Bulky mediastinal adenopathy narrows the right lower lobe pulmonary artery.  Interval increase in size of bulky mediastinal and hilar adenopathy as well as multiple bilateral pulmonary metastases.  Redemonstrated partially visualized large pancreatic mass. -Patient placed on IV antibiotics, oncology consulted patient was to start chemo however patient went to A. fib with RVR on 08/29/2020 transferred to the ICU placed on Cardizem drip and subsequently transition to amiodarone drip.  PCCM, cardiology consulted. -Patient subsequently converted to normal sinus rhythm, maintained on amiodarone drip with initiation of chemotherapy which was started 08/31/2020.  Patient on BiPAP nightly.     Assessment & Plan:   Principal Problem:   Acute on chronic respiratory failure with hypoxia (HCC) Active Problems:   Essential hypertension   COPD (chronic obstructive pulmonary disease) (HCC)   Pancreatic mass   Pneumonia   Protein-calorie malnutrition, severe   Small cell carcinoma (HCC)   Leukocytosis   Depression   New onset a-fib (HCC)    acute hypoxic respiratory failure likely multifactorial secondary to pneumonia in the setting of worsening  metastatic cancer/lymphangitic spread of cancer/COPD -increased O2 requirements when in a fib with RVR -Patient chronically on BiPAP at bedtime prior to admission. -heated high flow nasal cannula when not on Bipap -Urine strep pneumococcus antigen negative.  Urine Legionella antigen negative.  MRSA PCR negative. -Sputum with rare Enterococcus faecalis, no staph isolated. -IV Unasyn with stop date of 7 days placed -Continue scheduled nebs, Mucinex, Claritin, PPI, IV Solu-Medrol, Pulmicort nebs.  -wean steroids -Patient seen in consultation and being followed by oncology Dr. Marin Olp who discussed with patient's oncologist from [facility] and decision made to start inpatient chemotherapy -IV lasix with some improvement per nursing- plan to dose IV PRN daily -PCCM re consulted -Oncology following.    New onset A. fib with RVR CHA2DS2VASC score 3 -Likely secondary to acute pulmonary issues  -Cardiac enzymes negative.   -Repeat 2D echo done 08/29/2020 with EF 60 to 65%,NWMA, normal mitral valve, no evidence of mitral stenosis, normal left atrial size. -Patient was on a Cardizem drip and amiodarone drip and subsequently converted back to normal sinus rhythm. -TSH within normal limits -Cardizem drip has been transitioned to oral Cardizem and dose being adjusted per cardiology. -per cards: Will transition from IV Amiodarone to PO Amiodarone - 400mg  daily for 1 week and can then reduce to 200mg  daily. -PCCM discontinued IV heparin due to hemoptysis and patient currently on prophylactic dose Lovenox. -Per cardiology may need to reassess need for anticoagulation close to discharge.  -Cardiology following and appreciate input and recommendations  Hypertension -BP initially noted to be soft and as such amlodipine initially held and was resumed on 08/28/2020 and subsequently discontinued on 08/29/2020 as patient noted to be in A. fib and was on a Cardizem and amiodarone drip. 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 -initiation of chemotherapy  08/31/2020.   -Patient seems to have tolerated chemotherapy okay.   -Per oncology.     Depression -Wellbutrin, hydroxyzine for anxiety.    Moderate protein calorie malnutrition in the setting of malignancy -Continue dronabinol -Continue nutritional supplementation with Ensure.    Hyperkalemia -treated   Leukocytosis -Patient on IV steroids likely contributing to leukocytosis. -Leukocytosis fluctuating  -Continue IV antibiotics secondary to problem #1.     Hyperglycemia -Secondary to IV steroids. -Continue Lantus 8 units daily, SSI.    Aggressive PT -transfer out of SDU to progressive care in AM  DVT prophylaxis: Lovenox Code Status: Full Family Communication:  daughter at bedside. Disposition:   Status is: Inpatient  Remains inpatient appropriate because:Inpatient level of care appropriate due to severity of illness  Dispo: The patient is from: Home              Anticipated d/c is to: TBD              Patient currently is not medically stable to d/c.   Difficult to place patient No       Consultants:  Oncology: Dr. Marin Olp PCCM Cardiology  Procedures:  CT angiogram chest 08/12/2020 Chest x-ray 07/26/2020, 08/28/2020, 08/29/2020 2D echo 08/29/2020    Subjective: Very weak appearing  Objective: Vitals:   09/06/20 0818 09/06/20 0827 09/06/20 0828 09/06/20 1014  BP:    (!) 157/93  Pulse: 91     Resp: (!) 23     Temp:      TempSrc:      SpO2: 95% 92% 94%   Weight:      Height:        Intake/Output Summary (Last 24 hours) at 09/06/2020 1351 Last data filed at 09/06/2020 1018 Gross per 24 hour  Intake 436 ml  Output 1250 ml  Net -814 ml   Filed Weights   08/15/2020 1952 08/27/20 1559  08/29/20 0630  Weight: 61.2 kg 58.7 kg 58.8 kg    Examination:   General: Appearance:    Frail appearing male with mild work of breathing     Lungs:     Diminished breath  Heart:    Normal heart rate.   MS:   All extremities are intact.    Neurologic:   Awake, alert, frail appearing           Data Reviewed: I have personally reviewed following labs and imaging studies  CBC: Recent Labs  Lab 09/01/20 0500 09/02/20 0345 09/03/20 0640 09/04/20 0411 09/05/20 0427 09/06/20 0447  WBC 24.2* 26.4* 20.7* 19.2* 17.3* 11.5*  NEUTROABS 23.2*  --  20.2* 18.8* 16.6* 11.0*  HGB 11.4* 11.3* 10.8* 10.5* 10.7* 10.4*  HCT 36.2* 36.0* 34.3* 33.5* 33.8* 32.7*  MCV 83.6 84.1 84.5 85.0 84.3 83.8  PLT 269 258 228 212 186 540    Basic Metabolic Panel: Recent Labs  Lab 09/01/20 0500 09/02/20 0345 09/03/20 0640 09/04/20 0411 09/05/20 0427 09/06/20 0447  NA 140 141 140 139 135 138  K 3.8 3.8 3.8 4.0 4.5 4.5  CL 104 104 106 105 98 98  CO2 28 28 30 28 29 31   GLUCOSE 139* 136* 118* 139* 127* 191*  BUN 27* 29* 29* 26* 27* 31*  CREATININE 0.92 0.85 0.92 0.77 0.78 0.98  CALCIUM 8.5*  8.9 8.7* 8.6* 8.8* 8.6*  MG 2.4 2.4  --   --   --   --     GFR: Estimated Creatinine Clearance: 53.3 mL/min (by C-G formula based on SCr of 0.98 mg/dL).  Liver Function Tests: Recent Labs  Lab 09/02/20 0345 09/03/20 0640 09/04/20 0411 09/05/20 0427 09/06/20 0447  AST 94* 84* 74* 68* 52*  ALT 65* 59* 54* 55* 55*  ALKPHOS 84 86 85 100 89  BILITOT 0.3 0.4 0.3 0.3 0.6  PROT 5.9* 5.6* 5.4* 5.5* 5.2*  ALBUMIN 2.0* 2.0* 2.0* 2.0* 1.9*    CBG: Recent Labs  Lab 09/05/20 1149 09/05/20 1616 09/05/20 2058 09/06/20 0737 09/06/20 1347  GLUCAP 110* 184* 127* 170* 343*     No results found for this or any previous visit (from the past 240 hour(s)).         Radiology Studies: DG CHEST PORT 1 VIEW  Result Date: 09/04/2020 CLINICAL DATA:  Short of breath.  Lung cancer history. EXAM:  PORTABLE CHEST 1 VIEW COMPARISON:  08/31/2020 FINDINGS: Bilateral lung infiltrates right greater than left appear unchanged. Bilateral nodules also unchanged. Small left pleural effusion is progressive. Port-A-Cath tip SVC. IMPRESSION: Bilateral airspace disease and bilateral lung nodules unchanged. Progression of small left pleural effusion. Electronically Signed   By: Franchot Gallo M.D.   On: 09/04/2020 15:40        Scheduled Meds:  (feeding supplement) PROSource Plus  30 mL Oral TID BM   amiodarone  400 mg Oral Daily   Followed by   Derrill Memo ON 09/09/2020] amiodarone  200 mg Oral Daily   budesonide (PULMICORT) nebulizer solution  0.5 mg Nebulization BID   buPROPion  300 mg Oral Daily   chlorhexidine  15 mL Mouth Rinse BID   Chlorhexidine Gluconate Cloth  6 each Topical Daily   diltiazem  240 mg Oral Daily   dronabinol  2.5 mg Oral BID AC   enoxaparin (LOVENOX) injection  40 mg Subcutaneous Q24H   feeding supplement  237 mL Oral TID BM   gabapentin  300 mg Oral TID   guaiFENesin  1,200 mg Oral BID   insulin aspart  0-15 Units Subcutaneous TID WC   ipratropium  0.5 mg Nebulization TID   levalbuterol  0.63 mg Nebulization TID   lidocaine  3 patch Transdermal Q24H   loratadine  10 mg Oral Daily   mouth rinse  15 mL Mouth Rinse q12n4p   methylPREDNISolone (SOLU-MEDROL) injection  60 mg Intravenous Q24H   multivitamin with minerals  1 tablet Oral Daily   pantoprazole  40 mg Oral Daily   polyethylene glycol  17 g Oral Daily   sodium chloride flush  10-40 mL Intracatheter Q12H   sodium chloride flush  3 mL Intravenous Q12H   Continuous Infusions:  sodium chloride       LOS: 13 days    Time spent: 45 minutes    Geradine Girt, DO [facility]   To contact the attending provider between 7A-7P or the covering provider during after hours 7P-7A, please log into the web site [email or web address] and access using universal [place] password for that web site. If you do not have  the password, please call the hospital operator.  09/06/2020, 1:51 PM

## 2020-09-06 NOTE — Plan of Care (Signed)
Discussed with patient plan of care for the evening, pain management, Bipap usage, mouth care and bedtime medication needs with some teach back displayed.  Talked to and updated both daughters.  Daughter in town was allowed to stay overnight, if desired for patient comfort.  Problem: Education: Goal: Knowledge of General Education information will improve Description: Including pain rating scale, medication(s)/side effects and non-pharmacologic comfort measures Outcome: Progressing   Problem: Health Behavior/Discharge Planning: Goal: Ability to manage health-related needs will improve Outcome: Progressing   Problem: Pain Managment: Goal: General experience of comfort will improve Outcome: Progressing

## 2020-09-06 NOTE — Progress Notes (Signed)
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 He was actually discharged 7/28 after hospitalization for hypoxia due to PNA which caused sepsis, and planned to follow up at [facility] this week but returned to the ED 7/30 with worse acute symptoms. At time of discharge, he was started on home O2 at [facility].  In ED, CTA chest without PE but did show new patchy consolidative opacities, and interval incr in size of lung mets.   He was admitted to [facility], started on vanc zosyn cefepime. Remained on vanc cefepime until 8/1 when he was changed to azithro flagyl cefepime, and on 8/3 he was changed to unasyn.  8/4 early AM the pt had new onset Afib RVR, associated diaphoresis and SOB. He was noted by Rapid to be hypoxic, have crackles. He received SL nitro x2, morphine. TRH to bedside and gave 542ml IVF bolus and 10mg  dilt IV, then started on dilt gtt and transferred to ICU.   PCCM consulted during this admission for respiratory distress, BiPAP Status did improve and PCCM signed off  Was asked to see him again today as he is unable to come off BiPAP  Was started on chemotherapy, tolerated first treatment well  Pertinent  Medical History  COPD OSA  Agent orange exposure HLD GERD BPH Poorly differentiated metastatic disease: pancreatic tumor, numerous lung tumors, lymphangitic spread   Significant Hospital Events: Including procedures, antibiotic start and stop dates in addition to other pertinent events   7/30 admitted to [facility] with SOB. CTA neg for PE. Incr size of lung masses. PNA vs lymphangitic spread. Started on vanc  cefepime zosyn 7/31 vanc cefepime. Oncology consulted  8/1 azithro cefepime flagyl 8/3 unasyn 8/4 new Afib RVR, worse rep distress and hypoxia, on BiPAP, transferred to ICU. PCCM consulted  8/5 I took him off bipap, trial of HFNC. Hopefully starting chemo. Converted to sinus 8/4 evening after starting low dose metop  On BiPAP 8/11 - In sinus rhythm. Just generally tired. Denies any pain or discomfort  Interim History / Subjective:   8/12 - seen by onc. Still on BiPAP. 4d of Cycle 1 chemo. WBC going down. Not yet on neupogen per onc. Some hemoptysis +.  Needing night bipap and sometimes by end of day. Day time 25L HFNC with 100% fio2 > Daughter Nikki at bedside  Objective   Blood pressure (!) 145/87, pulse 91, temperature 97.9 F (36.6 C), temperature source Axillary, resp. rate (!) 23, height 5\' 7"  (1.702 m), weight 58.8 kg, SpO2 94 %.    FiO2 (%):  [80 %-100 %] 100 %   Intake/Output Summary (Last 24 hours) at 09/06/2020 1003 Last data filed at 09/06/2020 0413 Gross per 24 hour  Intake 676 ml  Output 2250 ml  Net -1574 ml   Filed Weights   07/31/2020 1952 08/27/20 1559 08/29/20 0630  Weight: 61.2 kg 58.7 kg 58.8 kg    Examination:  General Appearance:  Very frail, cachectic,  Head:  Normocephalic, without obvious abnormality, atraumatic Eyes:  PERRL - yes, conjunctiva/corneas - muddy     Ears:  Normal external ear canals, both ears Nose:  G tube - no bu thas [place] Throat:  ETT TUBE - no , OG tube - no Neck:  Supple,  No enlargement/tenderness/nodules Lungs: Mild tachypnea. Distant some crackles Heart:  S1 and S2 normal, no murmur, CVP - no.  Pressors - no Abdomen:  Soft, no masses, no organomegaly Genitalia / Rectal:  Not done Extremities:  Extremities- intact Skin:  ntact in exposed areas . Sacral area - note examined Neurologic:  Sedation - none -> RASS - +1 . Moves all 4s - yes. CAM-ICU - neg . 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 SE[place]re BiPAP QHS dependent hypoxemic resp failure persists with severe hypoxemia.  Plan BiPAP QHS HFNC in day time Check AcHRAb Cautious diuresis Complete course of antibiotics Continue weaning steroids as tolerated       SIGNATURE    Dr. Brand Males, M.D., F.C.C.P,  Pulmonary and Critical Care Medicine Staff Physician, [place] Director - Interstitial Lung Disease  Program  Pulmonary [place] at [place], [place], [address]  NPI Number:  NPI #[id number]  Pager: [phone], If no answer  -> Check AMION or Try [phone] Telephone (clinical office): [phone] Telephone (research): [phone]  10:40 AM 09/06/2020

## 2020-09-07 DIAGNOSIS — L8990 Pressure ulcer of unspecified site, unspecified stage: Secondary | ICD-10-CM | POA: Insufficient documentation

## 2020-09-07 DIAGNOSIS — J9621 Acute and chronic respiratory failure with hypoxia: Secondary | ICD-10-CM | POA: Diagnosis not present

## 2020-09-07 LAB — COMPREHENSIVE METABOLIC PANEL
ALT: 49 U/L — ABNORMAL HIGH (ref 0–44)
AST: 42 U/L — ABNORMAL HIGH (ref 15–41)
Albumin: 1.9 g/dL — ABNORMAL LOW (ref 3.5–5.0)
Alkaline Phosphatase: 84 U/L (ref 38–126)
Anion gap: 10 (ref 5–15)
BUN: 27 mg/dL — ABNORMAL HIGH (ref 8–23)
CO2: 27 mmol/L (ref 22–32)
Calcium: 8.3 mg/dL — ABNORMAL LOW (ref 8.9–10.3)
Chloride: 99 mmol/L (ref 98–111)
Creatinine, Ser: 0.9 mg/dL (ref 0.61–1.24)
GFR, Estimated: >60 mL/min (ref >60)
Glucose, Bld: 192 mg/dL — ABNORMAL HIGH (ref 70–99)
Potassium: 4.5 mmol/L (ref 3.5–5.1)
Sodium: 136 mmol/L (ref 135–145)
Total Bilirubin: 0.5 mg/dL (ref 0.3–1.2)
Total Protein: 5.1 g/dL — ABNORMAL LOW (ref 6.5–8.1)

## 2020-09-07 LAB — CBC WITH DIFFERENTIAL/PLATELET
Abs Immature Granulocytes: 0.17 10*3/uL — ABNORMAL HIGH (ref 0.00–0.07)
Basophils Absolute: 0 10*3/uL (ref 0.0–0.1)
Basophils Relative: 0 %
Eosinophils Absolute: 0 10*3/uL (ref 0.0–0.5)
Eosinophils Relative: 0 %
HCT: 33 % — ABNORMAL LOW (ref 39.0–52.0)
Hemoglobin: 10.2 g/dL — ABNORMAL LOW (ref 13.0–17.0)
Immature Granulocytes: 2 %
Lymphocytes Relative: 2 %
Lymphs Abs: 0.2 10*3/uL — ABNORMAL LOW (ref 0.7–4.0)
MCH: 26.8 pg (ref 26.0–34.0)
MCHC: 30.9 g/dL (ref 30.0–36.0)
MCV: 86.8 fL (ref 80.0–100.0)
Monocytes Absolute: 0 10*3/uL — ABNORMAL LOW (ref 0.1–1.0)
Monocytes Relative: 0 %
Neutro Abs: 7.5 10*3/uL (ref 1.7–7.7)
Neutrophils Relative %: 96 %
Platelets: 136 10*3/uL — ABNORMAL LOW (ref 150–400)
RBC: 3.8 MIL/uL — ABNORMAL LOW (ref 4.22–5.81)
RDW: 17.2 % — ABNORMAL HIGH (ref 11.5–15.5)
WBC: 7.8 10*3/uL (ref 4.0–10.5)
nRBC: 0 % (ref 0.0–0.2)

## 2020-09-07 LAB — BRAIN NATRIURETIC PEPTIDE: B Natriuretic Peptide: 76.3 pg/mL (ref 0.0–100.0)

## 2020-09-07 LAB — GLUCOSE, CAPILLARY
Glucose-Capillary: 188 mg/dL — ABNORMAL HIGH (ref 70–99)
Glucose-Capillary: 309 mg/dL — ABNORMAL HIGH (ref 70–99)
Glucose-Capillary: 196 mg/dL — ABNORMAL HIGH (ref 70–99)
Glucose-Capillary: 224 mg/dL — ABNORMAL HIGH (ref 70–99)

## 2020-09-07 MED ORDER — BACITRACIN-NEOMYCIN-POLYMYXIN 400-5-5000 EX OINT
TOPICAL_OINTMENT | Freq: Every day | CUTANEOUS | Status: DC
  Administered 2020-09-07: 1 via TOPICAL

## 2020-09-07 MED ORDER — SALINE SPRAY 0.65 % NA SOLN
1.0000 | NASAL | Status: DC | PRN
  Filled 2020-09-07: qty 44

## 2020-09-07 MED ORDER — METHYLPREDNISOLONE SODIUM SUCC 40 MG IJ SOLR
40.0000 mg | INTRAMUSCULAR | Status: DC
  Administered 2020-09-07: 40 mg via INTRAVENOUS
  Filled 2020-09-07: qty 1

## 2020-09-07 MED ORDER — FUROSEMIDE 10 MG/ML IJ SOLN
20.0000 mg | Freq: Once | INTRAMUSCULAR | Status: AC
  Administered 2020-09-07: 20 mg via INTRAVENOUS
  Filled 2020-09-07: qty 2

## 2020-09-07 MED ORDER — SENNA 8.6 MG PO TABS
1.0000 | ORAL_TABLET | Freq: Every day | ORAL | Status: DC
  Administered 2020-09-07 – 2020-09-08 (×2): 8.6 mg via ORAL
  Filled 2020-09-07 (×2): qty 1

## 2020-09-07 NOTE — Progress Notes (Signed)
Unfortunately, low progress is being made with his respiratory status.  He is still on the BiPAP at nighttime and high flow oxygen during the daytime.  He thinks that one of the problems is that he has neck pain.  He is on a TENS unit at home.  He says that if you get a TENS unit, this will help his pain and will also help his breathing.  I am not sure at the hospital has a TENS unit that can be used.  His blood counts are starting to come down.  His white cell count is 7.8.  His hemoglobin is 10.2.  Platelet count 136,000.  His albumin is only 1.9.  The nodule in the left supraclavicular fossa is about the same size.  He is not really complain of any nausea.  There is no bleeding.  He says he has not had any hemoptysis.  There has been no diarrhea.  His vital signs are temperature 97.4.  Pulse 91.  Blood pressure 137/87.  Oxygen saturation is 97%.  His lungs sound pretty clear bilaterally still.  He has decent air movement bilaterally.  Cardiac exam regular rate and rhythm.  Abdomen is soft.  Bowel sounds are present.  Extremity shows the symmetric muscle atrophy bilaterally.  Neurological exam is nonfocal.  He has had 1 cycle of chemotherapy.  He is now day 5 post his first cycle of chemotherapy.  His white cell count is trending downward which is to be expected.  I just wish that his pulmonary status would improve.  I do not know if a TENS unit would help.  I will think it would be a bad idea to try.  I know he is getting wonderful care from the staff in the ICU.  I appreciate all their hard work.  Lattie Haw, MD  [place] [phone]

## 2020-09-07 NOTE — Plan of Care (Signed)
Discussed with patient and family plan of care for the evening, pain management and bedtime medications with some teach back displayed.  Problem: Education: Goal: Knowledge of General Education information will improve Description: Including pain rating scale, medication(s)/side effects and non-pharmacologic comfort measures Outcome: Progressing

## 2020-09-07 NOTE — Progress Notes (Signed)
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 Patient presented with worsening hypoxic respiratory failure, shortness of breath.  CT angio showed interval development of patchy consolidative opacities throughout the right lung which may represent superimposed infectious process or potentially lymphangitic spread of tumor.   -Patient placed on IV antibiotics, oncology consulted patient was to start chemo however patient went to A. fib with RVR on 08/29/2020 transferred to the ICU placed on Cardizem drip and subsequently transition to amiodarone drip.  PCCM, cardiology consulted. -Patient subsequently converted to normal sinus rhythm, maintained on amiodarone drip with initiation of chemotherapy which was started 08/31/2020.  Patient on BiPAP nightly.     Assessment & Plan:   Principal Problem:   Acute on chronic respiratory failure with hypoxia (HCC) Active Problems:   Essential hypertension   COPD (chronic obstructive pulmonary disease) (HCC)   Pancreatic mass   Pneumonia   Protein-calorie malnutrition, severe   Small cell carcinoma (HCC)   Leukocytosis   Depression   New onset a-fib (HCC)    acute hypoxic respiratory failure likely multifactorial secondary to pneumonia in the setting of worsening metastatic cancer/lymphangitic spread of cancer/COPD -increased O2 requirements when in a fib with RVR -Patient chronically on BiPAP at bedtime prior to admission. -heated high flow nasal cannula when not on Bipap -Urine strep pneumococcus antigen negative.  Urine  Legionella antigen negative.  MRSA PCR negative. -Sputum with rare Enterococcus faecalis, no staph isolated. -IV Unasyn with stop date of 7 days placed -Continue scheduled nebs, Mucinex, Claritin, PPI, IV Solu-Medrol, Pulmicort nebs.  -wean steroids -Patient seen in consultation and being followed by oncology Dr. Marin Olp who discussed with patient's oncologist from [facility] and decision made to start inpatient chemotherapy -IV lasix with some improvement per nursing- plan to dose IV PRN - another dose 8/13 -PCCM consulted -Oncology following.    New onset A. fib with RVR CHA2DS2VASC score 3 -Likely secondary to acute pulmonary issues  -Cardiac enzymes negative.   -Repeat 2D echo done 08/29/2020 with EF 60 to 65%,NWMA, normal mitral valve, no evidence of mitral stenosis, normal left atrial size. -Patient was on a Cardizem drip and amiodarone drip and subsequently converted back to normal sinus rhythm. -TSH within normal limits -Cardizem drip has been transitioned to oral Cardizem and dose being adjusted per cardiology. -per cards: Will transition from IV Amiodarone to PO Amiodarone - 400mg  daily for 1 week and can then reduce to 200mg  daily. -PCCM discontinued IV heparin due to hemoptysis and patient currently on prophylactic dose Lovenox. -Per cardiology may need to reassess need for anticoagulation close to discharge.  -Cardiology following and appreciate input and recommendations  Hypertension -BP initially noted to be soft and as such amlodipine initially held and was resumed on 08/28/2020 and subsequently discontinued on 08/29/2020 as patient noted to be in A. fib and was on a Cardizem and amiodarone drip. 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 -initiation  of chemotherapy  08/31/2020.   -Patient seems to have tolerated chemotherapy okay.   -Per oncology.     Depression -Wellbutrin, hydroxyzine for anxiety.    Moderate protein calorie malnutrition in the setting of malignancy -Continue dronabinol -Continue nutritional supplementation with Ensure.    Hyperkalemia -treated   Leukocytosis -Patient on IV steroids likely contributing to leukocytosis. -Leukocytosis fluctuating  -Continue IV antibiotics secondary to problem #1.     Hyperglycemia -Secondary to IV steroids. -Continue Lantus 8 units daily, SSI.    Aggressive PT -transfer out of SDU to progressive care this PM if continues to improve and O2 requirement stabilizes  DVT prophylaxis: Lovenox Code Status: Full Family Communication:  daughter at bedside 8/12 Disposition:   Status is: Inpatient  Remains inpatient appropriate because:Inpatient level of care appropriate due to severity of illness  Dispo: The patient is from: Home              Anticipated d/c is to: TBD              Patient currently is not medically stable to d/c.   Difficult to place patient No       Consultants:  Oncology: Dr. Marin Olp PCCM Cardiology  Procedures:  CT angiogram chest 08/17/2020 Chest x-ray 08/19/2020, 08/28/2020, 08/29/2020 2D echo 08/29/2020    Subjective: Eating breakfast  Objective: Vitals:   09/07/20 0400 09/07/20 0500 09/07/20 0700 09/07/20 0816  BP: 136/84 137/87 131/88   Pulse: 88 91 91   Resp: 14 (!) 22 (!) 21   Temp:      TempSrc:      SpO2: 97% 96% 96% 93%  Weight:      Height:        Intake/Output Summary (Last 24 hours) at 09/07/2020 1100 Last data filed at 09/07/2020 0523 Gross per 24 hour  Intake 133 ml  Output 1625 ml  Net -1492 ml   Filed Weights   08/27/20 1559 08/29/20 0630 09/06/20 2241  Weight: 58.7 kg 58.8 kg 61 kg    Examination:   General: Appearance:    Frail male in no acute distress     Lungs:     diminished, respirations unlabored   Heart:    Normal heart rate.   MS:   All extremities are intact.    Neurologic:   Awake, alert, oriented x 3. No apparent focal neurological           defect.        Data Reviewed: I have personally reviewed following labs and imaging studies  CBC: Recent Labs  Lab 09/03/20 0640 09/04/20 0411 09/05/20 0427 09/06/20 0447 09/07/20 0528  WBC 20.7* 19.2* 17.3* 11.5* 7.8  NEUTROABS 20.2* 18.8* 16.6* 11.0* 7.5  HGB 10.8* 10.5* 10.7* 10.4* 10.2*  HCT 34.3* 33.5* 33.8* 32.7* 33.0*  MCV 84.5 85.0 84.3 83.8 86.8  PLT 228 212 186 159 136*    Basic Metabolic Panel: Recent Labs  Lab 09/01/20 0500 09/02/20 0345 09/03/20 0640 09/04/20 0411 09/05/20 0427 09/06/20 0447 09/07/20 0528  NA 140 141 140 139 135 138 136  K 3.8 3.8 3.8 4.0 4.5 4.5 4.5  CL 104 104 106 105 98 98 99  CO2 28 28 30 28 29 31 27   GLUCOSE 139* 136* 118* 139* 127* 191* 192*  BUN 27* 29* 29* 26* 27* 31* 27*  CREATININE 0.92 0.85 0.92 0.77 0.78 0.98 0.90  CALCIUM 8.5* 8.9 8.7* 8.6* 8.8* 8.6* 8.3*  MG 2.4 2.4  --   --   --   --   --  GFR: Estimated Creatinine Clearance: 60.2 mL/min (by C-G formula based on SCr of 0.9 mg/dL).  Liver Function Tests: Recent Labs  Lab 09/03/20 0640 09/04/20 0411 09/05/20 0427 09/06/20 0447 09/07/20 0528  AST 84* 74* 68* 52* 42*  ALT 59* 54* 55* 55* 49*  ALKPHOS 86 85 100 89 84  BILITOT 0.4 0.3 0.3 0.6 0.5  PROT 5.6* 5.4* 5.5* 5.2* 5.1*  ALBUMIN 2.0* 2.0* 2.0* 1.9* 1.9*    CBG: Recent Labs  Lab 09/06/20 0737 09/06/20 1347 09/06/20 1731 09/06/20 2313 09/07/20 0755  GLUCAP 170* 343* 167* 232* 188*     No results found for this or any previous visit (from the past 240 hour(s)).         Radiology Studies: No results found.      Scheduled Meds:  (feeding supplement) PROSource Plus  30 mL Oral TID BM   amiodarone  400 mg Oral Daily   Followed by   Derrill Memo ON 09/09/2020] amiodarone  200 mg Oral Daily   budesonide (PULMICORT) nebulizer solution  0.5  mg Nebulization BID   buPROPion  300 mg Oral Daily   chlorhexidine  15 mL Mouth Rinse BID   Chlorhexidine Gluconate Cloth  6 each Topical Daily   diltiazem  240 mg Oral Daily   dronabinol  2.5 mg Oral BID AC   enoxaparin (LOVENOX) injection  40 mg Subcutaneous Q24H   feeding supplement  237 mL Oral TID BM   furosemide  20 mg Intravenous Once   gabapentin  300 mg Oral TID   guaiFENesin  1,200 mg Oral BID   insulin aspart  0-15 Units Subcutaneous TID WC   ipratropium  0.5 mg Nebulization TID   levalbuterol  0.63 mg Nebulization TID   lidocaine  3 patch Transdermal Q24H   loratadine  10 mg Oral Daily   mouth rinse  15 mL Mouth Rinse q12n4p   methylPREDNISolone (SOLU-MEDROL) injection  40 mg Intravenous Q24H   multivitamin with minerals  1 tablet Oral Daily   pantoprazole  40 mg Oral Daily   polyethylene glycol  17 g Oral Daily   senna  1 tablet Oral Daily   sodium chloride flush  10-40 mL Intracatheter Q12H   sodium chloride flush  3 mL Intravenous Q12H   Continuous Infusions:  sodium chloride       LOS: 14 days    Time spent: 45 minutes    Geradine Girt, DO [facility]   To contact the attending provider between 7A-7P or the covering provider during after hours 7P-7A, please log into the web site [email or web address] and access using universal [place] password for that web site. If you do not have the password, please call the hospital operator.  09/07/2020, 11:00 AM

## 2020-09-07 NOTE — Progress Notes (Addendum)
Additional family members have come to the lobby and have called requesting to visit.  Patient has had daughter, Grant Taylor and son-in-law Grant Taylor visiting today. Per patient, his children are not currently conversing. Patient's daughter Grant called and spoke with the nursing supervisor about her concerns of not ever being able to visit her father with the 2 visitors per day policy. This RN, ICU charge RN, and the nursing supervisor met with the patient to discuss his wishes for visitation. So that both daughters may have visitation it was agreed upon by the patient that his daughters would rotate days. Grant Taylor and Grant Taylor are to have privileges on Sunday, Tuesday and Thursday. Grant Taylor and Grant Taylor are visiting today, will stay, and then they will have privileges on Monday, Wednesday, and Friday.

## 2020-09-08 DIAGNOSIS — J9621 Acute and chronic respiratory failure with hypoxia: Secondary | ICD-10-CM | POA: Diagnosis not present

## 2020-09-08 LAB — CBC WITH DIFFERENTIAL/PLATELET
Abs Immature Granulocytes: 0.11 10*3/uL — ABNORMAL HIGH (ref 0.00–0.07)
Basophils Absolute: 0 10*3/uL (ref 0.0–0.1)
Basophils Relative: 0 %
Eosinophils Absolute: 0 10*3/uL (ref 0.0–0.5)
Eosinophils Relative: 0 %
HCT: 32.5 % — ABNORMAL LOW (ref 39.0–52.0)
Hemoglobin: 10.2 g/dL — ABNORMAL LOW (ref 13.0–17.0)
Immature Granulocytes: 5 %
Lymphocytes Relative: 10 %
Lymphs Abs: 0.2 10*3/uL — ABNORMAL LOW (ref 0.7–4.0)
MCH: 26.8 pg (ref 26.0–34.0)
MCHC: 31.4 g/dL (ref 30.0–36.0)
MCV: 85.5 fL (ref 80.0–100.0)
Monocytes Absolute: 0 10*3/uL — ABNORMAL LOW (ref 0.1–1.0)
Monocytes Relative: 1 %
Neutro Abs: 1.8 10*3/uL (ref 1.7–7.7)
Neutrophils Relative %: 84 %
Platelets: 109 10*3/uL — ABNORMAL LOW (ref 150–400)
RBC: 3.8 MIL/uL — ABNORMAL LOW (ref 4.22–5.81)
RDW: 16.9 % — ABNORMAL HIGH (ref 11.5–15.5)
WBC: 2.2 10*3/uL — ABNORMAL LOW (ref 4.0–10.5)
nRBC: 0 % (ref 0.0–0.2)

## 2020-09-08 LAB — GLUCOSE, CAPILLARY
Glucose-Capillary: 182 mg/dL — ABNORMAL HIGH (ref 70–99)
Glucose-Capillary: 111 mg/dL — ABNORMAL HIGH (ref 70–99)
Glucose-Capillary: 229 mg/dL — ABNORMAL HIGH (ref 70–99)
Glucose-Capillary: 162 mg/dL — ABNORMAL HIGH (ref 70–99)

## 2020-09-08 LAB — BASIC METABOLIC PANEL
Anion gap: 9 (ref 5–15)
BUN: 30 mg/dL — ABNORMAL HIGH (ref 8–23)
CO2: 29 mmol/L (ref 22–32)
Calcium: 8.5 mg/dL — ABNORMAL LOW (ref 8.9–10.3)
Chloride: 97 mmol/L — ABNORMAL LOW (ref 98–111)
Creatinine, Ser: 0.9 mg/dL (ref 0.61–1.24)
GFR, Estimated: >60 mL/min (ref >60)
Glucose, Bld: 193 mg/dL — ABNORMAL HIGH (ref 70–99)
Potassium: 4.7 mmol/L (ref 3.5–5.1)
Sodium: 135 mmol/L (ref 135–145)

## 2020-09-08 MED ORDER — METHYLPREDNISOLONE SODIUM SUCC 125 MG IJ SOLR
60.0000 mg | INTRAMUSCULAR | Status: DC
  Administered 2020-09-08 – 2020-09-11 (×4): 60 mg via INTRAVENOUS
  Filled 2020-09-08 (×4): qty 2

## 2020-09-08 MED ORDER — METHOCARBAMOL 500 MG PO TABS
500.0000 mg | ORAL_TABLET | Freq: Four times a day (QID) | ORAL | Status: DC | PRN
  Administered 2020-09-08 – 2020-09-09 (×2): 500 mg via ORAL
  Filled 2020-09-08 (×2): qty 1

## 2020-09-08 MED ORDER — SENNA 8.6 MG PO TABS
2.0000 | ORAL_TABLET | Freq: Every day | ORAL | Status: DC
  Administered 2020-09-09 – 2020-09-10 (×2): 17.2 mg via ORAL
  Filled 2020-09-08 (×2): qty 2

## 2020-09-08 NOTE — Plan of Care (Signed)
Discussed with patient plan of care, pain management and night bath prior to bipap being placed back on with some teach back displayed.   Problem: Education: Goal: Knowledge of General Education information will improve Description: Including pain rating scale, medication(s)/side effects and non-pharmacologic comfort measures Outcome: Progressing   Problem: Health Behavior/Discharge Planning: Goal: Ability to manage health-related needs will improve Outcome: Progressing

## 2020-09-08 NOTE — Progress Notes (Signed)
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 He was actually discharged 7/28 after hospitalization for hypoxia due to PNA which caused sepsis, and planned to follow up at [facility] this week but returned to the ED 7/30 with worse acute symptoms. At time of discharge, he was started on home O2 at [facility].  In ED, CTA chest without PE but did show new patchy consolidative opacities, and interval incr in size of lung mets.   He was admitted to [facility], started on vanc zosyn cefepime. Remained on vanc cefepime until 8/1 when he was changed to azithro flagyl cefepime, and on 8/3 he was changed to unasyn.  8/4 early AM the pt had new onset Afib RVR, associated diaphoresis and SOB. He was noted by Rapid to be hypoxic, have crackles. He received SL nitro x2, morphine. TRH to bedside and gave IVF bolus and 10mg  dilt IV, then started on dilt gtt and transferred to ICU.   PCCM consulted during this admission for respiratory distress, BiPAP Status did improve and PCCM signed off  Was asked to see him again today as he is unable to come off BiPAP  Was started on chemotherapy, tolerated first treatment well  Pertinent  Medical History  COPD OSA  Agent orange exposure HLD GERD BPH Poorly differentiated metastatic disease: pancreatic tumor, numerous lung tumors, lymphangitic spread   Significant Hospital Events: Including procedures, antibiotic start and stop dates in addition to other pertinent events   7/30 admitted to [facility] with SOB. CTA neg for PE. Incr size of lung masses. PNA vs lymphangitic spread. Started on vanc  cefepime zosyn 7/31 vanc cefepime. Oncology consulted  8/1 azithro cefepime flagyl 8/3 unasyn 8/4 new Afib RVR, worse rep distress and hypoxia, on BiPAP, transferred to ICU. PCCM consulted  8/5 I took him off bipap, trial of HFNC. Hopefully starting chemo. Converted to sinus 8/4 evening after starting low dose metop  On BiPAP 09/02/2020: Start amiodarone 4 mg once daily 8/11 - In sinus rhythm. Just generally tired. Denies any pain or discomfort 8/12 - seen by onc. Still on BiPAP. 4d of Cycle 1 chemo. WBC going down. Not yet on neupogen per onc. Some hemoptysis +.  Needing night bipap and sometimes by end of day. Day time 25L HFNC with 100% fio2 > Daughter Nikki at bedside.  Acetylcholine receptor antibody sent  Interim History / Subjective:   8/14 -cycle of BiPAP at night and daytime 100% FiO2 25 L heated high flow nasal cannula continues.  He is beginning to develop  pressure ulcer on his nasal bridnasal bridge.  Currently eating.  Denies any active complaints.  He is on amiodarone 400 mg daily x7 days since 09/02/2020.  Tomorrow 09/09/2020 we will start 20 mg once daily  Objective   Blood pressure (!) 165/98, pulse 93, temperature 97.8 F (36.6 C), temperature source Axillary, resp. rate 15, height 5\' 7"  (1.702 m), weight 61 kg, SpO2 93 %.    FiO2 (%):  [100 %] 100 %   Intake/Output Summary (Last 24 hours) at 09/08/2020 1307 Last data filed at 09/08/2020 0000 Gross per 24 hour  Intake 613 ml  Output 2350 ml  Net -1737 ml   Filed Weights   08/27/20 1559 08/29/20 0630 09/06/20 2241  Weight: 58.7 kg 58.8 kg 61 kg    Examination:  Extremely frail cachectic male. Pulse ox 87%.  He is eating.  He is tachypneic.  Able to talk sentences.  Has a small pinpoint ulcer on his nasal bridge.  There are crackles abdomen soft right Port-A-Cath present emaciated and physically deconditioned.  Resolved Hospital Problem list     Assessment & Plan:  Baseline/At admit Metastatic cancer Small cell  lung cancer Lymphangitic spread of cancer in the lungs Atrial fibrillation Essential hypertension Chronic obstructive pulm disease Pancreatic mass Protein calorie malnutrition Diabetes Depression History of agent orange exposure  Currentl at admit Acute hypoxemic respiratory failure recovering from multilobar pneumonia and cancer - Patient with very limited reserves  On amiodarone high-dose since 09/02/2020  09/08/2020-overall no progress.  Still requiring high flow nasal cannula 800% and nighttime BiPAP.  Developing ulcer on his nasal bridge  Plan BiPAP QHS HFNC in day time Await AcHRAb from 09/06/2020 On 09/09/2020 check ESR, ANA, rheumatoid factor and CCP and chest x-ray -> any concern for [place] consider increasing steroids [currently on Solu-Medrol 40 mg once daily] Cautious diuresis Complete course of antibiotics        SIGNATURE    Dr. Brand Males, M.D., F.C.C.P,  Pulmonary and Critical Care Medicine Staff Physician, [place] Director - Interstitial Lung Disease  Program  Pulmonary [place] at [place], [place], [address]  NPI Number:  NPI #[id number]  Pager: [phone], If no answer  -> Check AMION or Try [place] Telephone (clinical office): [phone] Telephone (research): [phone]  1:07 PM 09/08/2020

## 2020-09-08 NOTE — Progress Notes (Signed)
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 Patient presented with worsening hypoxic respiratory failure, shortness of breath.  CT angio showed interval development of patchy consolidative opacities throughout the right lung which may represent superimposed infectious process or potentially lymphangitic spread of tumor.   -Patient placed on IV antibiotics, oncology consulted patient was to start chemo however patient went to A. fib with RVR on 08/29/2020 transferred to the ICU placed on Cardizem drip and subsequently transition to amiodarone drip.  PCCM, cardiology consulted. -Patient subsequently converted to normal sinus rhythm, maintained on amiodarone drip with initiation of chemotherapy which was started 08/31/2020.  Patient on BiPAP nightly. -having difficulty weaning him from Bipap and O2     Assessment & Plan:   Principal Problem:   Acute on chronic respiratory failure with hypoxia (HCC) Active Problems:   Essential hypertension   COPD (chronic obstructive pulmonary disease) (HCC)   Pancreatic mass   Pneumonia   Protein-calorie malnutrition, severe   Small cell carcinoma (HCC)   Leukocytosis   Depression   New onset a-fib (HCC)   Pressure injury of skin    acute hypoxic respiratory failure likely multifactorial secondary to pneumonia in the setting of worsening metastatic cancer/lymphangitic spread of cancer/COPD -increased O2 requirements when in a fib with RVR -Patient chronically on BiPAP at bedtime prior to admission. -heated high flow nasal cannula  when not on Bipap -Urine strep pneumococcus antigen negative.  Urine Legionella antigen negative.  MRSA PCR negative. -Sputum with rare Enterococcus faecalis, no staph isolated. -IV Unasyn with stop date of 7 days placed -Continue scheduled nebs, Mucinex, Claritin, PPI, IV Solu-Medrol, Pulmicort nebs.  -wean steroids -Patient seen in consultation and being followed by oncology Dr. Marin Olp who discussed with patient's oncologist from [facility] and decision made to start inpatient chemotherapy -IV lasix with some improvement per nursing- plan to dose IV PRN - another dose 8/13 -PCCM consulted -Oncology following.    New onset A. fib with RVR CHA2DS2VASC score 3 -Likely secondary to acute pulmonary issues  -Cardiac enzymes negative.   -Repeat 2D echo done 08/29/2020 with EF 60 to 65%,NWMA, normal mitral valve, no evidence of mitral stenosis, normal left atrial size. -Patient was on a Cardizem drip and amiodarone drip and subsequently converted back to normal sinus rhythm. -TSH within normal limits -Cardizem drip has been transitioned to oral Cardizem and dose being adjusted per cardiology. -per cards: Will transition from IV Amiodarone to PO Amiodarone - 400mg  daily for 1 week and can then reduce to 200mg  daily. -PCCM discontinued IV heparin due to hemoptysis and patient currently on prophylactic dose Lovenox. -Per cardiology may need to reassess need for anticoagulation close to discharge.  -Cardiology following and appreciate input and recommendations  Hypertension -BP initially noted to be soft and as such amlodipine initially held and was resumed on 08/28/2020 and subsequently discontinued on 08/29/2020 as patient noted to be in A. fib and was on a Cardizem and amiodarone drip. 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 -initiation of chemotherapy  08/31/2020.   -Patient seems to have tolerated chemotherapy okay.   -Per oncology.     Depression -Wellbutrin, hydroxyzine for anxiety.    Moderate protein calorie malnutrition in the setting of malignancy -Continue dronabinol -Continue nutritional supplementation with Ensure.    Hyperkalemia -treated   Leukocytosis -Patient on IV steroids likely contributing to leukocytosis. -Leukocytosis fluctuating  -Continue IV antibiotics secondary to problem #1.     Hyperglycemia -Secondary to IV steroids. -Continue Lantus 8 units daily, SSI.    Aggressive PT And nutrition  DVT prophylaxis: Lovenox Code Status: Full Family Communication:  daughter at bedside 8/14 Disposition:   Status is: Inpatient  Remains inpatient appropriate because:Inpatient level of care appropriate due to severity of illness  Dispo: The patient is from: Home              Anticipated d/c is to: TBD              Patient currently is not medically stable to d/c.   Difficult to place patient No       Consultants:  Oncology: Dr. Marin Olp PCCM Cardiology  Procedures:  CT angiogram chest 07/28/2020 Chest x-ray 08/15/2020, 08/28/2020, 08/29/2020 2D echo 08/29/2020    Subjective: Could not eat much due to breathing  Objective: Vitals:   09/08/20 1200 09/08/20 1300 09/08/20 1311 09/08/20 1400  BP: 135/87 (!) 166/137    Pulse: 86 91    Resp: 14 15    Temp:   97.6 F (36.4 C)   TempSrc:   Axillary   SpO2: 97% (!) 89%  96%  Weight:      Height:        Intake/Output Summary (Last 24 hours) at 09/08/2020 1458 Last data filed at 09/08/2020 1300 Gross per 24 hour  Intake 860 ml  Output 1000 ml  Net -140 ml   Filed Weights   08/27/20 1559 08/29/20 0630 09/06/20 2241  Weight: 58.7 kg 58.8 kg 61 kg    Examination:   General: Appearance:    Chronically ill appearing male     Lungs:     Diminished, no  wheezing  Heart:    Normal heart rate.   MS:   All extremities are intact.    Neurologic:   Awake, alert, oriented x 3         Data Reviewed: I have personally reviewed following labs and imaging studies  CBC: Recent Labs  Lab 09/04/20 0411 09/05/20 0427 09/06/20 0447 09/07/20 0528 09/08/20 0616  WBC 19.2* 17.3* 11.5* 7.8 2.2*  NEUTROABS 18.8* 16.6* 11.0* 7.5 1.8  HGB 10.5* 10.7* 10.4* 10.2* 10.2*  HCT 33.5* 33.8* 32.7* 33.0* 32.5*  MCV 85.0 84.3 83.8 86.8 85.5  PLT 212 186 159 136* 109*    Basic Metabolic Panel: Recent Labs  Lab 09/02/20 0345 09/03/20 0640 09/04/20 0411 09/05/20 0427 09/06/20 0447 09/07/20 0528 09/08/20 0616  NA 141   < > 139 135 138 136 135  K 3.8   < > 4.0 4.5 4.5 4.5 4.7  CL 104   < > 105 98 98 99 97*  CO2 28   < > 28 29 31 27 29   GLUCOSE 136*   < > 139* 127* 191* 192* 193*  BUN 29*   < > 26* 27* 31* 27* 30*  CREATININE 0.85   < > 0.77 0.78 0.98 0.90 0.90  CALCIUM 8.9   < > 8.6* 8.8* 8.6*  8.3* 8.5*  MG 2.4  --   --   --   --   --   --    < > = values in this interval not displayed.    GFR: Estimated Creatinine Clearance: 60.2 mL/min (by C-G formula based on SCr of 0.9 mg/dL).  Liver Function Tests: Recent Labs  Lab 09/03/20 0640 09/04/20 0411 09/05/20 0427 09/06/20 0447 09/07/20 0528  AST 84* 74* 68* 52* 42*  ALT 59* 54* 55* 55* 49*  ALKPHOS 86 85 100 89 84  BILITOT 0.4 0.3 0.3 0.6 0.5  PROT 5.6* 5.4* 5.5* 5.2* 5.1*  ALBUMIN 2.0* 2.0* 2.0* 1.9* 1.9*    CBG: Recent Labs  Lab 09/07/20 1313 09/07/20 1628 09/07/20 2121 09/08/20 0753 09/08/20 1243  GLUCAP 309* 196* 224* 182* 111*     No results found for this or any previous visit (from the past 240 hour(s)).         Radiology Studies: No results found.      Scheduled Meds:  (feeding supplement) PROSource Plus  30 mL Oral TID BM   [START ON 09/09/2020] amiodarone  200 mg Oral Daily   budesonide (PULMICORT) nebulizer solution  0.5 mg Nebulization BID    buPROPion  300 mg Oral Daily   chlorhexidine  15 mL Mouth Rinse BID   Chlorhexidine Gluconate Cloth  6 each Topical Daily   diltiazem  240 mg Oral Daily   dronabinol  2.5 mg Oral BID AC   enoxaparin (LOVENOX) injection  40 mg Subcutaneous Q24H   feeding supplement  237 mL Oral TID BM   gabapentin  300 mg Oral TID   guaiFENesin  1,200 mg Oral BID   insulin aspart  0-15 Units Subcutaneous TID WC   ipratropium  0.5 mg Nebulization TID   levalbuterol  0.63 mg Nebulization TID   lidocaine  3 patch Transdermal Q24H   loratadine  10 mg Oral Daily   mouth rinse  15 mL Mouth Rinse q12n4p   methylPREDNISolone (SOLU-MEDROL) injection  40 mg Intravenous Q24H   multivitamin with minerals  1 tablet Oral Daily   neomycin-bacitracin-polymyxin   Topical Daily   pantoprazole  40 mg Oral Daily   polyethylene glycol  17 g Oral Daily   [START ON 09/09/2020] senna  2 tablet Oral Daily   sodium chloride flush  10-40 mL Intracatheter Q12H   sodium chloride flush  3 mL Intravenous Q12H   Continuous Infusions:  sodium chloride       LOS: 15 days    Time spent: 45 minutes    Geradine Girt, DO [facility]   To contact the attending provider between 7A-7P or the covering provider during after hours 7P-7A, please log into the web site [email or web address] and access using universal [place] password for that web site. If you do not have the password, please call the hospital operator.  09/08/2020, 2:58 PM

## 2020-09-08 NOTE — Plan of Care (Signed)
Discussed with patient plan of care for the evening, pain management and bipap with some teach back displayed.  Patient wanted bipap on around 2015 due to being short of breathe.  Problem: Education: Goal: Knowledge of General Education information will improve Description: Including pain rating scale, medication(s)/side effects and non-pharmacologic comfort measures Outcome: Progressing

## 2020-09-09 ENCOUNTER — Inpatient Hospital Stay (HOSPITAL_COMMUNITY): Payer: No Typology Code available for payment source

## 2020-09-09 DIAGNOSIS — J9621 Acute and chronic respiratory failure with hypoxia: Secondary | ICD-10-CM | POA: Diagnosis not present

## 2020-09-09 LAB — BASIC METABOLIC PANEL
Anion gap: 8 (ref 5–15)
BUN: 33 mg/dL — ABNORMAL HIGH (ref 8–23)
CO2: 28 mmol/L (ref 22–32)
Calcium: 9.1 mg/dL (ref 8.9–10.3)
Chloride: 98 mmol/L (ref 98–111)
Creatinine, Ser: 1 mg/dL (ref 0.61–1.24)
GFR, Estimated: >60 mL/min (ref >60)
Glucose, Bld: 248 mg/dL — ABNORMAL HIGH (ref 70–99)
Potassium: 4.9 mmol/L (ref 3.5–5.1)
Sodium: 134 mmol/L — ABNORMAL LOW (ref 135–145)

## 2020-09-09 LAB — CBC WITH DIFFERENTIAL/PLATELET
Abs Immature Granulocytes: 0.01 10*3/uL (ref 0.00–0.07)
Basophils Absolute: 0 10*3/uL (ref 0.0–0.1)
Basophils Relative: 0 %
Eosinophils Absolute: 0 10*3/uL (ref 0.0–0.5)
Eosinophils Relative: 0 %
HCT: 31.9 % — ABNORMAL LOW (ref 39.0–52.0)
HCT: 31.2 % — ABNORMAL LOW (ref 39.0–52.0)
Hemoglobin: 9.9 g/dL — ABNORMAL LOW (ref 13.0–17.0)
Hemoglobin: 9.8 g/dL — ABNORMAL LOW (ref 13.0–17.0)
Immature Granulocytes: 2 %
Lymphocytes Relative: 27 %
Lymphs Abs: 0.2 10*3/uL — ABNORMAL LOW (ref 0.7–4.0)
MCH: 26.2 pg (ref 26.0–34.0)
MCH: 26.8 pg (ref 26.0–34.0)
MCHC: 31 g/dL (ref 30.0–36.0)
MCHC: 31.4 g/dL (ref 30.0–36.0)
MCV: 84.4 fL (ref 80.0–100.0)
MCV: 85.5 fL (ref 80.0–100.0)
Monocytes Absolute: 0 10*3/uL — ABNORMAL LOW (ref 0.1–1.0)
Monocytes Relative: 2 %
Neutro Abs: 0.4 10*3/uL — CL (ref 1.7–7.7)
Neutrophils Relative %: 69 %
Platelets: 87 10*3/uL — ABNORMAL LOW (ref 150–400)
Platelets: 85 10*3/uL — ABNORMAL LOW (ref 150–400)
RBC: 3.78 MIL/uL — ABNORMAL LOW (ref 4.22–5.81)
RBC: 3.65 MIL/uL — ABNORMAL LOW (ref 4.22–5.81)
RDW: 16.6 % — ABNORMAL HIGH (ref 11.5–15.5)
RDW: 16.6 % — ABNORMAL HIGH (ref 11.5–15.5)
WBC: 0.6 10*3/uL — CL (ref 4.0–10.5)
WBC: 0.6 10*3/uL — CL (ref 4.0–10.5)
nRBC: 0 % (ref 0.0–0.2)
nRBC: 0 % (ref 0.0–0.2)

## 2020-09-09 LAB — GLUCOSE, CAPILLARY
Glucose-Capillary: 239 mg/dL — ABNORMAL HIGH (ref 70–99)
Glucose-Capillary: 392 mg/dL — ABNORMAL HIGH (ref 70–99)
Glucose-Capillary: 262 mg/dL — ABNORMAL HIGH (ref 70–99)
Glucose-Capillary: 134 mg/dL — ABNORMAL HIGH (ref 70–99)

## 2020-09-09 LAB — SEDIMENTATION RATE: Sed Rate: 105 mm/hr — ABNORMAL HIGH (ref 0–16)

## 2020-09-09 MED ORDER — FILGRASTIM 480 MCG/1.6ML IJ SOLN
480.0000 ug | Freq: Every day | INTRAMUSCULAR | Status: DC
  Filled 2020-09-09: qty 1.6

## 2020-09-09 MED ORDER — FILGRASTIM 480 MCG/1.6ML IJ SOLN
480.0000 ug | Freq: Every day | INTRAMUSCULAR | Status: DC
  Administered 2020-09-09 – 2020-09-10 (×2): 480 ug via SUBCUTANEOUS
  Filled 2020-09-09 (×3): qty 1.6

## 2020-09-09 MED ORDER — AMOXICILLIN-POT CLAVULANATE 875-125 MG PO TABS
1.0000 | ORAL_TABLET | Freq: Two times a day (BID) | ORAL | Status: DC
  Administered 2020-09-09 – 2020-09-10 (×3): 1 via ORAL
  Filled 2020-09-09 (×3): qty 1

## 2020-09-09 MED ORDER — INSULIN ASPART 100 UNIT/ML IJ SOLN
5.0000 [IU] | Freq: Three times a day (TID) | INTRAMUSCULAR | Status: DC
  Administered 2020-09-09 – 2020-09-10 (×2): 5 [IU] via SUBCUTANEOUS

## 2020-09-09 NOTE — Progress Notes (Addendum)
We now have neutropenia for Mr. Grant Taylor.  His white cell count has dropped quite quickly over the weekend.  This morning, his white cell count is 800.  I will start him on Neupogen.  Hopefully, his white cell count will come up and 2 or 3 days.  He still is on the BiPAP at night.  I guess he is on high flow oxygen during the daytime.  His oxygen saturation this morning was 97%.  The nodule in the left supraclavicular fossa is somewhat smaller.  Hopefully this is a good sign that treatment is now working.  He is not having any hemoptysis.  I just wish that he would be able to improve his breathing status so that he would not need as much supplemental oxygen.  His sodium is 134.  Potassium 4.9.  BUN is 33 creatinine 1.  His blood sugar is 248.  His platelet count is 87,000.  Hemoglobin 9.9.  He had a chest x-ray this morning.  The results are not yet back.  His vital signs are stable.  Blood pressure 113/83.  Temperature 97.8.  Pulse 87.  His lungs sound clear on the left side.  He does have decent breath sounds on the right side.  I hear no wheezes.  Cardiac exam regular rate and rhythm.  Abdomen is soft.  Again, his pulmonary status is going to dictate his hospitalization.  I just wish that his oxygen level would get a little better with less oxygen requirements.  We had to be very cautious with him an infection now.  Again his neutrophil count is 400.  I would have him on some prophylactic antibiotics.  I will try him on some Augmentin.  He cannot take Floxin drugs.    I know that the staff in the ICU are doing a fantastic job with him.  I do appreciate all their incredible efforts to try to get him better.  Lattie Haw, MD  Philippians 4:8-9

## 2020-09-09 NOTE — Progress Notes (Signed)
Physical Therapy Treatment Patient Details Name: Grant Taylor MRN: [id number] DOB: 1945/11/23 Today's Date: 09/09/2020    History of Present Illness Pt is 75 yo male admitted with sepsis, acute resp failure due to PNE and mets to lung on 08/09/20.  Pt with hx of metastatic pancreatic CA and COPD.    PT Comments    Performed BUE/LE strengthening exercises at bed level. SaO2 85% with exercise, on 45L HHFNC, with non rebreather. Did not attempt mobility due to respiratory status.     Follow Up Recommendations  SNF;Supervision for mobility/OOB;Supervision/Assistance - 24 hour     Equipment Recommendations  None recommended by PT    Recommendations for Other Services       Precautions / Restrictions Precautions Precautions: Fall Precaution Comments: prefers shoes; monitor O2 Restrictions Weight Bearing Restrictions: No    Mobility  Bed Mobility               General bed mobility comments: NT-per RN request 2* hypoxia with activity    Transfers                    Ambulation/Gait                 Stairs             Wheelchair Mobility    Modified Rankin (Stroke Patients Only)       Balance                                            Cognition Arousal/Alertness: Awake/alert Behavior During Therapy: WFL for tasks assessed/performed Overall Cognitive Status: Within Functional Limits for tasks assessed                                 General Comments: AxO x 3 pleasant      Exercises General Exercises - Lower Extremity Ankle Circles/Pumps: AROM;Both;10 reps;Supine Quad Sets: AROM;Both;5 reps;Supine Gluteal Sets: AROM;Both;5 reps;Supine Heel Slides: AROM;Both;10 reps;Supine (with manual resistance from PT) Hip ABduction/ADduction: AROM;Both;10 reps;Supine Shoulder Exercises Shoulder Flexion: AROM;Both;10 reps;Supine    General Comments General comments (skin integrity, edema, etc.): SaO2  85% on 45L HHFNC with exercises, 90-92% at rest      Pertinent Vitals/Pain Pain Score: 8  Pain Location: neck (pt reports h/o 2 cervical surgeries, pain is chronic) Pain Descriptors / Indicators: Aching Pain Intervention(s): Limited activity within patient's tolerance;Monitored during session;Premedicated before session    Home Living                      Prior Function            PT Goals (current goals can now be found in the care plan section) Acute Rehab PT Goals Patient Stated Goal: to get stronger PT Goal Formulation: With patient Time For Goal Achievement: 09/19/20 Potential to Achieve Goals: Fair Progress towards PT goals: Progressing toward goals    Frequency    Min 2X/week      PT Plan Current plan remains appropriate    Co-evaluation              AM-PAC PT "6 Clicks" Mobility   Outcome Measure  Help needed turning from your back to your side while in a flat bed without using bedrails?: Total Help needed moving from lying  on your back to sitting on the side of a flat bed without using bedrails?: Total Help needed moving to and from a bed to a chair (including a wheelchair)?: Total Help needed standing up from a chair using your arms (e.g., wheelchair or bedside chair)?: Total Help needed to walk in hospital room?: Total Help needed climbing 3-5 steps with a railing? : Total 6 Click Score: 6    End of Session Equipment Utilized During Treatment: Gait belt;Oxygen Activity Tolerance: Patient limited by fatigue Patient left: in bed;with bed alarm set Nurse Communication: Mobility status PT Visit Diagnosis: Difficulty in walking, not elsewhere classified (R26.2);Muscle weakness (generalized) (M62.81);Unsteadiness on feet (R26.81)     Time: 7289-7915 PT Time Calculation (min) (ACUTE ONLY): 11 min  Charges:  $Therapeutic Exercise: 8-22 mins                    Blondell Reveal Kistler PT 09/09/2020  Acute Rehabilitation Services Pager  [phone] Office [phone]

## 2020-09-09 NOTE — TOC Progression Note (Signed)
Transition of Care [facility]) - Progression Note    Patient Details  Name: Grant Taylor MRN: [id number] Date of Birth: 07/15/1945  Transition of [facility]) CM/SW Contact  Leeroy Cha, RN Phone Number: 09/09/2020, 9:00 AM  Clinical Narrative:    Significant Hospital Events: Including procedures, antibiotic start and stop dates in addition to other pertinent events   7/30 admitted to [facility] with SOB. CTA neg for PE. Incr size of lung masses. PNA vs lymphangitic spread. Started on vanc cefepime zosyn 7/31 vanc cefepime. Oncology consulted  8/1 azithro cefepime flagyl 8/3 unasyn 8/4 new Afib RVR, worse rep distress and hypoxia, on BiPAP, transferred to ICU. PCCM consulted  8/5 I took him off bipap, trial of HFNC. Hopefully starting chemo. Converted to sinus 8/4 evening after starting low dose metop  On BiPAP 09/02/2020: Start amiodarone 4 mg once daily 8/11 - In sinus rhythm. Just generally tired. Denies any pain or discomfort 8/12 - seen by onc. Still on BiPAP. 4d of Cycle 1 chemo. WBC going down. Not yet on neupogen per onc. Some hemoptysis +.  Needing night bipap and sometimes by end of day. Day time 25L HFNC with 100% fio2 > Daughter Nikki at bedside.  Acetylcholine receptor antibody sent [id number] on hfnc non rebreather mask at 45l/min,  Expected Discharge Plan: [place] Barriers to Discharge: Continued Medical Work up  Expected Discharge Plan and Services Expected Discharge Plan: [place]   Discharge Planning Services: CM Consult Post Acute Care Choice: Durable Medical Equipment, Home Health Living arrangements for the past 2 months: Single Family Home                                       Social Determinants of Health (SDOH) Interventions    Readmission Risk Interventions No flowsheet data found.

## 2020-09-09 NOTE — Progress Notes (Addendum)
NAME:  Grant Taylor, MRN:  [id number], DOB:  04/28/1945, LOS: 62 ADMISSION DATE:  07/26/2020, CONSULTATION DATE:  08/29/20 REFERRING MD:  [place] Silos  TRH, CHIEF COMPLAINT:  cough SOB  BRIEF  75 yo M PMH COPD, agent orange exposure,  GERD, OSA on home BiPAP, and recently diagnosed poorly differentiated metastatic disease: large pancreatic mass with numerous lung mets and lymphangitic spread who was admitted 7/30 for SOB, cough. He was actually discharged 7/28 after hospitalization for hypoxia due to PNA which caused sepsis, and planned to follow up at [facility] this week but returned to the ED 7/30 with worse acute symptoms. At time of discharge, he was started on home O2 at [facility].  In ED, CTA chest without PE but did show new patchy consolidative opacities, and interval incr in size of lung mets.   He was admitted to [facility], started on vanc zosyn cefepime. Remained on vanc cefepime until 8/1 when he was changed to azithro flagyl cefepime, and on 8/3 he was changed to unasyn.  8/4 early AM the pt had new onset Afib RVR, associated diaphoresis and SOB. He was noted by Rapid to be hypoxic, have crackles. He received SL nitro x2, morphine. TRH to bedside and gave 553ml IVF bolus and 10mg  dilt IV, then started on dilt gtt and transferred to ICU.   PCCM consulted during this admission for respiratory distress, BiPAP Status did improve marginally and PCCM signed off  Was asked to again 8/14 as he is unable to come off BiPAP  Was started on chemotherapy, tolerated first treatment well  Pertinent  Medical History  COPD OSA  Agent orange exposure HLD GERD BPH Poorly differentiated metastatic disease: pancreatic tumor, numerous lung tumors, lymphangitic spread   Significant Hospital Events: Including procedures, antibiotic start and stop dates in addition to other pertinent events   7/30 admitted to [facility] with SOB. CTA neg for PE. Incr size of lung masses. PNA vs lymphangitic spread. Started on vanc  cefepime zosyn 7/31 vanc cefepime. Oncology consulted  8/1 azithro cefepime flagyl 8/3 unasyn 8/4 new Afib RVR, worse rep distress and hypoxia, on BiPAP, transferred to ICU. PCCM consulted  8/5 I took him off bipap, trial of HFNC. Hopefully starting chemo. Converted to sinus 8/4 evening after starting low dose metop  On BiPAP 09/02/2020: Start amiodarone 4 mg once daily 8/11 - In sinus rhythm. Just generally tired. Denies any pain or discomfort 8/12 - seen by onc. Still on BiPAP. 4d of Cycle 1 chemo. WBC going down. Not yet on neupogen per onc. Some hemoptysis +.  Needing night bipap and sometimes by end of day. Day time 25L HFNC with 100% fio2 > Daughter Nikki at bedside.  Acetylcholine receptor antibody sent 8/14 desats on HFNC 100%, spending majority of time on BiPAP 8/15 Desats 70s on 100% HFNC, with NRB added sats 80s-low 90s. Worsening over time.  Interim History / Subjective:   Desats 70s on 100% HFNC, with NRB added sats 80s-low 90s.  Objective   Blood pressure 135/89, pulse (!) 103, temperature 98.6 F (37 C), temperature source Oral, resp. rate 16, height 5\' 7"  (1.702 m), weight 61 kg, SpO2 90 %.    FiO2 (%):  [100 %] 100 %   Intake/Output Summary (Last 24 hours) at 09/09/2020 1152 Last data filed at 09/09/2020 0845 Gross per 24 hour  Intake 958 ml  Output 2600 ml  Net -1642 ml    Filed Weights   08/27/20 1559 08/29/20 0630 09/06/20 2241  Weight:  58.7 kg 58.8 kg 61 kg    Examination:  General: Cachectic, in respiratory distress Eyes: EOMI, no icterus neck: Thin, no JVP appreciated Pulmonary: Coarse rhonchorous sounds throughout, increased work of breathing, desaturates to 70s on high flow nasal cannula 100% FiO2 Cardiovascular: Tachycardic, no murmurs Abdomen: Nondistended, bowel sounds present MSK: No synovitis, joint effusion Neuro: Diffuse symmetrical weakness without focal deficit, sensation appears intact Psych: Flat affect, does not want to talk much, appears  depressed  Resolved Hospital Problem list     Assessment & Plan:  Acute on chronic hypoxemic respiratory failure: Baseline emphysema.  Recent diagnosis metastatic neuroendocrine tumor presumed pancreatic primary given imaging.  CT chest from last admission to this admission, about 2 weeks apart demonstrates significant bulky diffuse large pulmonary metastases with interval development of interlobular septal thickening with predilection for interior of lung most consistent with limited expressive tumor on my interpretation.  Despite broad-spectrum antibiotics, diuresis, high-dose steroid therapy, he continues to worsen in terms of hypoxemia.  Now desaturate significantly on high flow nasal cannula.  Near BiPAP dependent.  This is due to worsening metastatic disease clinically. --No further pulmonary intervention --Can continue Solu-Medrol 1 mg/kg daily but has been on higher doses without improvement this admission --Can continue antibiotics but does not seem to be yielding improvement --Does not appear volume overloaded, has not improved with Lasix, see no role for Lasix at this time --Recommend palliative care consultation as his respiratory failure is incurable and appears irreversible --Intubation and mechanical ventilation are futile in this setting  I had a long extensive conversation regarding next steps in his care.  Unsure of his breathing and oxygen will continue to worsen 1 option would be to pursue  mechanical ventilation.  I told him this was a bad idea.  I told him there is nothing that I can identify that I can make better while he is on the ventilator.  I said that without the ventilator he would die from his breathing and low oxygen.  I told him on the ventilator he would also die.  I strongly recommended he be DNR given the lack of reversibility for his primary problem.  He did consider this and seemed to nodded multiple times that going on the ventilator is a bad idea and he would not  want that.  However he did not commit to DNR status.  He requested that I discuss the care with Dr. Marin Olp, the respiratory therapy team, and other medical providers.  I ensured him I was doing this and will continue to this.  I told him this was another benefit of the palliative care team was to help coordinate his care through this difficult time.  He was amenable to palliative care consultation.  Critical Care: n/a   I spent 45 minutes in care of the patient greater than 50% of which was face-to-face care but also includes but not limited to review of records, coordination of care.  Lanier Clam, MD See Amion for contact info  11:52 AM 09/09/2020

## 2020-09-09 NOTE — Progress Notes (Signed)
Nutrition Follow-up  DOCUMENTATION CODES:   Severe malnutrition in context of chronic illness  INTERVENTION:  - continue 30 ml Prosource Plus TID and Ensure Plus TID.  - will monitor for outcome of meeting with Palliative Care.   NUTRITION DIAGNOSIS:   Severe Malnutrition related to chronic illness (metastatic cancer) as evidenced by severe fat depletion, severe muscle depletion. -ongoing  GOAL:   Patient will meet greater than or equal to 90% of their needs -met  MONITOR:   PO intake, Supplement acceptance, Labs, Weight trends, I & O's  ASSESSMENT:   Pt readmitted for acute hypoxic respiratory failure 2/2 COPD exacerbation vs PNA vs worsening metastatic cancer/lymphangitic spread of cancer. 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  Patient discussed in rounds this AM. Pending Palliative Care consult.   Most recently documented meal intakes were 75% of breakfast and 90% of dinner on 8/13; 100% of lunch and 100% of dinner on 8/14; 100% of breakfast today.  Per review of orders, he has been accepting Prosource Plus 100% of the time offered. He has been offered Ensure Plus 50-75% of the time ordered but he has accepted it 100% of the time offered.   He has not been weighed since 8/12 and weight on that date was consistent with admission (7/30) weight. L neck edema noted in the edema section of flow sheet. He is noted to be -14.6 L since admission.    Labs reviewed; CBGs: 239 and 392 mg/dl, Na: 134 mmol/l, BUN: 33 mg/dl. Medications reviewed; 2.5 mg marinol BID, sliding scale novolog, 60 mg solu-medrol/day, 1 tablet multivitamin with minerals/day. 40 mg oral protonix/day, 17 g miralax/day, 2 tablets  senokot/day.    Diet Order:   Diet Order             DIET SOFT Room service appropriate? Yes; Fluid consistency: Thin  Diet effective now                   EDUCATION NEEDS:   No education needs have been identified at this time  Skin:  Skin Assessment: Skin Integrity Issues: Skin Integrity Issues:: Other (Comment), Stage II Stage II: nose (newly documented 8/13) Other: pressure injury to coccyx (no stage documented)  Last BM:  8/15 (type 3 x1)  Height:   Ht Readings from Last 1 Encounters:  08/29/20 5' 7" (1.702 m)    Weight:   Wt Readings from Last 1 Encounters:  09/06/20 61 kg     Estimated Nutritional Needs:  Kcal:  1950-2150 Protein:  95-110 grams Fluid:  >2L     Jessica Ostheim, MS, RD, LDN, CNSC Inpatient Clinical Dietitian RD pager # available in AMION  After hours/weekend pager # available in AMION  

## 2020-09-09 NOTE — Progress Notes (Signed)
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 Patient presented with worsening hypoxic respiratory failure, shortness of breath.  CT angio showed interval development of patchy consolidative opacities throughout the right lung which may represent superimposed infectious process or potentially lymphangitic spread of tumor.   -Patient placed on IV antibiotics, oncology consulted patient was to start chemo however patient went to A. fib with RVR on 08/29/2020 transferred to the ICU placed on Cardizem drip and subsequently transition to amiodarone drip.  PCCM, cardiology consulted. -Patient subsequently converted to normal sinus rhythm, maintained on amiodarone drip with initiation of chemotherapy which was started 08/31/2020.  Patient on BiPAP nightly. -having difficulty weaning him from Bipap and O2     Assessment & Plan:   Principal Problem:   Acute on chronic respiratory failure with hypoxia (HCC) Active Problems:   Essential hypertension   COPD (chronic obstructive pulmonary disease) (HCC)   Pancreatic mass   Pneumonia   Protein-calorie malnutrition, severe   Small cell carcinoma (HCC)   Leukocytosis   Depression   New onset a-fib (HCC)   Pressure injury of skin    acute hypoxic respiratory failure likely multifactorial secondary to pneumonia in the setting of worsening metastatic cancer/lymphangitic spread of cancer/COPD -increased O2 requirements when in a fib with RVR -Patient chronically on BiPAP at bedtime prior to admission. -heated high flow nasal cannula  when not on Bipap -Urine strep pneumococcus antigen negative.  Urine Legionella antigen negative.  MRSA PCR negative. -Sputum with rare Enterococcus faecalis, no staph isolated. -IV Unasyn with stop date of 7 days placed -Continue scheduled nebs, Mucinex, Claritin, PPI, IV Solu-Medrol, Pulmicort nebs.  -wean steroids -Patient seen in consultation and being followed by oncology Dr. Marin Olp who discussed with patient's oncologist from [facility] and decision made to start inpatient chemotherapy -IV lasix with some improvement per nursing- plan to dose IV PRN - another dose 8/13 -PCCM consulted -Oncology following.    New onset A. fib with RVR CHA2DS2VASC score 3 -Likely secondary to acute pulmonary issues  -Cardiac enzymes negative.   -Repeat 2D echo done 08/29/2020 with EF 60 to 65%,NWMA, normal mitral valve, no evidence of mitral stenosis, normal left atrial size. -Patient was on a Cardizem drip and amiodarone drip and subsequently converted back to normal sinus rhythm. -TSH within normal limits -Cardizem drip has been transitioned to oral Cardizem and dose being adjusted per cardiology. -per cards: Will transition from IV Amiodarone to PO Amiodarone - 400mg  daily for 1 week and can then reduce to 200mg  daily. -PCCM discontinued IV heparin due to hemoptysis and patient currently on prophylactic dose Lovenox. -Per cardiology may need to reassess need for anticoagulation close to discharge.  -Cardiology following and appreciate input and recommendations  Hypertension -BP initially noted to be soft and as such amlodipine initially held and was resumed on 08/28/2020 and subsequently discontinued on 08/29/2020 as patient noted to be in A. fib and was on a Cardizem and amiodarone drip. 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 -initiation of chemotherapy  08/31/2020.   -Patient seems to have tolerated chemotherapy okay.   -Per oncology.   -AM 8/15- WBCs decreased   Depression -Wellbutrin, hydroxyzine for anxiety.    Moderate protein calorie malnutrition in the setting of malignancy -Continue dronabinol -Continue nutritional supplementation with Ensure.    Hyperkalemia -treated   Hyperglycemia -Secondary to IV steroids. -novolog, SSI.    Aggressive PT And nutrition  Palliative care for GOC  DVT prophylaxis: Lovenox Code Status: Full Family Communication:  daughter at bedside 8/14 Disposition:   Status is: Inpatient  Remains inpatient appropriate because:Inpatient level of care appropriate due to severity of illness  Dispo: The patient is from: Home              Anticipated d/c is to: TBD              Patient currently is not medically stable to d/c.   Difficult to place patient No       Consultants:  Oncology: Dr. Marin Olp PCCM Cardiology  Procedures:  CT angiogram chest 07/29/2020 Chest x-ray 08/22/2020, 08/28/2020, 08/29/2020 2D echo 08/29/2020    Subjective: Frustrated that his breathing is not better C/o neck pain  Objective: Vitals:   09/09/20 1100 09/09/20 1146 09/09/20 1200 09/09/20 1300  BP: 135/89  (!) 159/87 (!) 148/88  Pulse: (!) 103  (!) 105 (!) 106  Resp: 16  19 20   Temp:   97.9 F (36.6 C)   TempSrc:   Oral   SpO2: 91% 90% (!) 88% (!) 87%  Weight:      Height:        Intake/Output Summary (Last 24 hours) at 09/09/2020 1342 Last data filed at 09/09/2020 0845 Gross per 24 hour  Intake 711 ml  Output 1650 ml  Net -939 ml   Filed Weights   08/27/20 1559 08/29/20 0630 09/06/20 2241  Weight: 58.7 kg 58.8 kg 61 kg    Examination:   General: Appearance:    Ill appearing male in no acute distress     Lungs:    On high flow, diminished  Heart:    Tachycardic.   MS:   All extremities are intact.     Neurologic:   Awake, alert      Data Reviewed: I have personally reviewed following labs and imaging studies  CBC: Recent Labs  Lab 09/05/20 0427 09/06/20 0447 09/07/20 0528 09/08/20 0616 09/09/20 0500 09/09/20 0543  WBC 17.3* 11.5* 7.8 2.2* 0.6* 0.6*  NEUTROABS 16.6* 11.0* 7.5 1.8 0.4*  --   HGB 10.7* 10.4* 10.2* 10.2* 9.9* 9.8*  HCT 33.8* 32.7* 33.0* 32.5* 31.9* 31.2*  MCV 84.3 83.8 86.8 85.5 84.4 85.5  PLT 186 159 136* 109* 87* 85*    Basic Metabolic Panel: Recent Labs  Lab 09/05/20 0427 09/06/20 0447 09/07/20 0528 09/08/20 0616 09/09/20 0500  NA 135 138 136 135 134*  K 4.5 4.5 4.5 4.7 4.9  CL 98 98 99 97* 98  CO2 29 31 27 29 28   GLUCOSE 127* 191* 192* 193* 248*  BUN 27* 31* 27* 30* 33*  CREATININE 0.78 0.98 0.90 0.90 1.00  CALCIUM 8.8* 8.6* 8.3* 8.5* 9.1    GFR: Estimated Creatinine Clearance: 54.2 mL/min (by C-G formula based on SCr of 1 mg/dL).  Liver Function Tests: Recent Labs  Lab 09/03/20 0640 09/04/20 0411 09/05/20 0427 09/06/20 0447 09/07/20 0528  AST 84* 74* 68* 52* 42*  ALT 59* 54*  55* 55* 49*  ALKPHOS 86 85 100 89 84  BILITOT 0.4 0.3 0.3 0.6 0.5  PROT 5.6* 5.4* 5.5* 5.2* 5.1*  ALBUMIN 2.0* 2.0* 2.0* 1.9* 1.9*    CBG: Recent Labs  Lab 09/08/20 1243 09/08/20 1630 09/08/20 2142 09/09/20 0755 09/09/20 1207  GLUCAP 111* 229* 162* 239* 392*     No results found for this or any previous visit (from the past 240 hour(s)).         Radiology Studies: DG CHEST PORT 1 VIEW  Result Date: 09/09/2020 CLINICAL DATA:  Respiratory failure with hypoxia EXAM: PORTABLE CHEST 1 VIEW COMPARISON:  Multiple chest radiographs, most recently 09/04/2020. CTA chest, 08/08/2020. FINDINGS: Cardiomediastinal silhouette is unchanged. Access, right IJ port, with catheter tip position unchanged. Patchy pulmonary opacities, with a basilar predominance. Bilateral nodular consolidations, best appreciated on recent compression CT chest. No large pleural  effusion. No pneumothorax. Overlying support lines. Cervical fusion hardware, incompletely imaged. No interval osseous abnormality. IMPRESSION: Unchanged pulmonary findings, with bilateral airspace disease and multifocal pulmonary metastasis. Electronically Signed   By: Michaelle Birks M.D.   On: 09/09/2020 08:06        Scheduled Meds:  (feeding supplement) PROSource Plus  30 mL Oral TID BM   amiodarone  200 mg Oral Daily   amoxicillin-clavulanate  1 tablet Oral Q12H   budesonide (PULMICORT) nebulizer solution  0.5 mg Nebulization BID   buPROPion  300 mg Oral Daily   chlorhexidine  15 mL Mouth Rinse BID   Chlorhexidine Gluconate Cloth  6 each Topical Daily   diltiazem  240 mg Oral Daily   dronabinol  2.5 mg Oral BID AC   enoxaparin (LOVENOX) injection  40 mg Subcutaneous Q24H   feeding supplement  237 mL Oral TID BM   filgrastim  480 mcg Subcutaneous Daily   gabapentin  300 mg Oral TID   guaiFENesin  1,200 mg Oral BID   insulin aspart  0-15 Units Subcutaneous TID WC   ipratropium  0.5 mg Nebulization TID   levalbuterol  0.63 mg Nebulization TID   lidocaine  3 patch Transdermal Q24H   loratadine  10 mg Oral Daily   mouth rinse  15 mL Mouth Rinse q12n4p   methylPREDNISolone (SOLU-MEDROL) injection  60 mg Intravenous Q24H   multivitamin with minerals  1 tablet Oral Daily   neomycin-bacitracin-polymyxin   Topical Daily   pantoprazole  40 mg Oral Daily   polyethylene glycol  17 g Oral Daily   senna  2 tablet Oral Daily   sodium chloride flush  10-40 mL Intracatheter Q12H   sodium chloride flush  3 mL Intravenous Q12H   Continuous Infusions:  sodium chloride       LOS: 16 days    Time spent: 45 minutes    Geradine Girt, DO [facility]   To contact the attending provider between 7A-7P or the covering provider during after hours 7P-7A, please log into the web site [email or web address] and access using universal  password for that web site. If you do not have the  password, please call the hospital operator.  09/09/2020, 1:42 PM

## 2020-09-10 DIAGNOSIS — J9621 Acute and chronic respiratory failure with hypoxia: Secondary | ICD-10-CM | POA: Diagnosis not present

## 2020-09-10 LAB — CYCLIC CITRUL PEPTIDE ANTIBODY, IGG/IGA: CCP Antibodies IgG/IgA: 5 units (ref 0–19)

## 2020-09-10 LAB — CBC WITH DIFFERENTIAL/PLATELET
HCT: 32 % — ABNORMAL LOW (ref 39.0–52.0)
Hemoglobin: 10 g/dL — ABNORMAL LOW (ref 13.0–17.0)
MCH: 26.5 pg (ref 26.0–34.0)
MCHC: 31.3 g/dL (ref 30.0–36.0)
MCV: 84.9 fL (ref 80.0–100.0)
Platelets: 76 10*3/uL — ABNORMAL LOW (ref 150–400)
RBC: 3.77 MIL/uL — ABNORMAL LOW (ref 4.22–5.81)
RDW: 16.8 % — ABNORMAL HIGH (ref 11.5–15.5)
WBC: 0.7 10*3/uL — CL (ref 4.0–10.5)
nRBC: 0 % (ref 0.0–0.2)

## 2020-09-10 LAB — GLUCOSE, CAPILLARY
Glucose-Capillary: 170 mg/dL — ABNORMAL HIGH (ref 70–99)
Glucose-Capillary: 57 mg/dL — ABNORMAL LOW (ref 70–99)
Glucose-Capillary: 178 mg/dL — ABNORMAL HIGH (ref 70–99)

## 2020-09-10 LAB — COMPREHENSIVE METABOLIC PANEL
ALT: 58 U/L — ABNORMAL HIGH (ref 0–44)
AST: 44 U/L — ABNORMAL HIGH (ref 15–41)
Albumin: 2 g/dL — ABNORMAL LOW (ref 3.5–5.0)
Alkaline Phosphatase: 107 U/L (ref 38–126)
Anion gap: 11 (ref 5–15)
BUN: 32 mg/dL — ABNORMAL HIGH (ref 8–23)
CO2: 28 mmol/L (ref 22–32)
Calcium: 9.3 mg/dL (ref 8.9–10.3)
Chloride: 101 mmol/L (ref 98–111)
Creatinine, Ser: 0.94 mg/dL (ref 0.61–1.24)
GFR, Estimated: >60 mL/min (ref >60)
Glucose, Bld: 157 mg/dL — ABNORMAL HIGH (ref 70–99)
Potassium: 4.7 mmol/L (ref 3.5–5.1)
Sodium: 140 mmol/L (ref 135–145)
Total Bilirubin: 0.4 mg/dL (ref 0.3–1.2)
Total Protein: 5.6 g/dL — ABNORMAL LOW (ref 6.5–8.1)

## 2020-09-10 LAB — RHEUMATOID FACTOR: Rheumatoid fact SerPl-aCnc: 13.5 IU/mL (ref <14.0)

## 2020-09-10 LAB — PREALBUMIN: Prealbumin: 12.6 mg/dL — ABNORMAL LOW (ref 18–38)

## 2020-09-10 LAB — ACETYLCHOLINE RECEPTOR AB, ALL
Acety choline binding ab: <0.03 nmol/L (ref 0.00–0.24)
Acetylchol Block Ab: 23 % (ref 0–25)

## 2020-09-10 MED ORDER — DEXTROSE 50 % IV SOLN
INTRAVENOUS | Status: AC
  Administered 2020-09-10: 25 g via INTRAVENOUS
  Filled 2020-09-10: qty 50

## 2020-09-10 MED ORDER — MORPHINE BOLUS VIA INFUSION
2.0000 mg | INTRAVENOUS | Status: DC | PRN
  Administered 2020-09-10 – 2020-09-11 (×4): 2 mg via INTRAVENOUS
  Filled 2020-09-10: qty 2

## 2020-09-10 MED ORDER — TBO-FILGRASTIM 480 MCG/0.8ML ~~LOC~~ SOSY: 480.0000 ug | PREFILLED_SYRINGE | Freq: Every day | SUBCUTANEOUS | Status: DC

## 2020-09-10 MED ORDER — DEXTROSE 50 % IV SOLN
25.0000 g | INTRAVENOUS | Status: AC
  Administered 2020-09-10: 25 g via INTRAVENOUS

## 2020-09-10 MED ORDER — MORPHINE SULFATE (PF) 2 MG/ML IV SOLN
2.0000 mg | Freq: Once | INTRAVENOUS | Status: AC
  Administered 2020-09-10: 2 mg via INTRAVENOUS
  Filled 2020-09-10: qty 1

## 2020-09-10 MED ORDER — MORPHINE 100MG IN NS 100ML (1MG/ML) PREMIX INFUSION
1.0000 mg/h | INTRAVENOUS | Status: DC
  Administered 2020-09-10: 0.5 mg/h via INTRAVENOUS
  Administered 2020-09-12: 7 mg/h via INTRAVENOUS
  Filled 2020-09-10 (×3): qty 100

## 2020-09-10 NOTE — Progress Notes (Signed)
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 Patient presented again a few days later with worsening hypoxic respiratory failure, shortness of breath.  CT angio showed interval development of patchy consolidative opacities throughout the right lung which may represent superimposed infectious process or potentially lymphangitic spread of tumor.   -Patient placed on IV antibiotics, oncology consulted patient was to start chemo however patient went to A. fib with RVR on 08/29/2020 transferred to the ICU placed on Cardizem drip and subsequently transition to amiodarone drip.  PCCM, cardiology consulted. -Patient subsequently converted to normal sinus rhythm, maintained on amiodarone drip with initiation of chemotherapy which was started 08/31/2020.  Patient on BiPAP nightly. -having difficulty weaning him from Bipap and O2     Assessment & Plan:   Principal Problem:   Acute on chronic respiratory failure with hypoxia (HCC) Active Problems:   Essential hypertension   COPD (chronic obstructive pulmonary disease) (HCC)   Pancreatic mass   Pneumonia   Protein-calorie malnutrition, severe   Small cell carcinoma (HCC)   Leukocytosis   Depression   New onset a-fib (HCC)   Pressure injury of skin    acute hypoxic respiratory failure likely multifactorial secondary to pneumonia in the setting of worsening metastatic cancer/lymphangitic spread of cancer/COPD -Patient chronically on BiPAP at bedtime prior to admission. -heated high flow nasal cannula when not on Bipap -Urine  strep pneumococcus antigen negative.  Urine Legionella antigen negative.  MRSA PCR negative. -Sputum with rare Enterococcus faecalis, no staph isolated. -IV Unasyn s/p 7 days- augmentin -Continue scheduled nebs, Mucinex, Claritin, PPI, IV Solu-Medrol, Pulmicort nebs.  -wean steroids as able -Patient seen in consultation and being followed by oncology Dr. Marin Olp who discussed with patient's oncologist from [facility] and decision made to start inpatient chemotherapy -IV lasix with some improvement per nursing- plan to dose IV PRN - another dose 8/13- held for now [facility] consulted -Oncology following.    New onset A. fib with RVR CHA2DS2VASC score 3 -Likely secondary to acute pulmonary issues  -Cardiac enzymes negative.   -Repeat 2D echo done 08/29/2020 with EF 60 to 65%,NWMA, normal mitral valve, no evidence of mitral stenosis, normal left atrial size. -TSH within normal limits -Cardizem drip has been transitioned to oral Cardizem and dose being adjusted per cardiology. -per cards: Will transition from IV Amiodarone to PO Amiodarone - 400mg  daily for 1 week and can then reduce to 200mg  daily. -PCCM discontinued IV heparin due to hemoptysis and patient currently on prophylactic dose Lovenox. -Per cardiology may need to reassess need for anticoagulation close to discharge.   Hypertension -BP initially noted to be soft and as such amlodipine initially held and was resumed on 08/28/2020 and subsequently discontinued on 08/29/2020 as patient noted to be in A. fib and was on a Cardizem and amiodarone drip. 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 -initiation of chemotherapy  08/31/2020.   -Patient seems to have tolerated chemotherapy okay.   -Per  oncology.   -AM 8/15,  8/16- WBCs decreased   Depression -Wellbutrin, hydroxyzine for anxiety.    Moderate protein calorie malnutrition in the setting of malignancy -Continue dronabinol -Continue nutritional supplementation with Ensure.    Hyperkalemia -treated   Hyperglycemia -Secondary to IV steroids. -SSI only  Aggressive PT And nutrition  Palliative care for GOC  DVT prophylaxis: Lovenox Code Status: Full Family Communication:  daughter at bedside 8/14 Disposition:   Status is: Inpatient  Remains inpatient appropriate because:Inpatient level of care appropriate due to severity of illness  Dispo: The patient is from: Home              Anticipated d/c is to: TBD              Patient currently is not medically stable to d/c.   Difficult to place patient No       Consultants:  Oncology: Dr. Marin Olp PCCM Cardiology  Procedures:  CT angiogram chest 08/01/2020 Chest x-ray 08/21/2020, 08/28/2020, 08/29/2020 2D echo 08/29/2020    Subjective: Was not able to get TENS unit working  Objective: Vitals:   09/10/20 0500 09/10/20 0600 09/10/20 0730 09/10/20 1248  BP: 115/79 108/69    Pulse: 91 88 88   Resp: 15 16 20    Temp:   97.8 F (36.6 C) 97.7 F (36.5 C)  TempSrc:   Axillary Axillary  SpO2: 92% 92% (!) 89% 91%  Weight:      Height:        Intake/Output Summary (Last 24 hours) at 09/10/2020 1316 Last data filed at 09/10/2020 0630 Gross per 24 hour  Intake 30 ml  Output 1775 ml  Net -1745 ml   Filed Weights   08/27/20 1559 08/29/20 0630 09/06/20 2241  Weight: 58.7 kg 58.8 kg 61 kg    Examination:   General: Appearance:    Ill appearing male in mild respiratory distress     Lungs:     respirations unlabored  Heart:    Normal heart rate.    MS:   All extremities are intact.    Neurologic:   Awake, alert, oriented x 3.         Data Reviewed: I have personally reviewed following labs and imaging studies  CBC: Recent Labs  Lab 09/05/20 0427 09/06/20 0447  09/07/20 0528 09/08/20 0616 09/09/20 0500 09/09/20 0543 09/10/20 0859  WBC 17.3* 11.5* 7.8 2.2* 0.6* 0.6* 0.7*  NEUTROABS 16.6* 11.0* 7.5 1.8 0.4*  --   --   HGB 10.7* 10.4* 10.2* 10.2* 9.9* 9.8* 10.0*  HCT 33.8* 32.7* 33.0* 32.5* 31.9* 31.2* 32.0*  MCV 84.3 83.8 86.8 85.5 84.4 85.5 84.9  PLT 186 159 136* 109* 87* 85* 76*    Basic Metabolic Panel: Recent Labs  Lab 09/06/20 0447 09/07/20 0528 09/08/20 0616 09/09/20 0500 09/10/20 0859  NA 138 136 135 134* 140  K 4.5 4.5 4.7 4.9 4.7  CL 98 99 97* 98 101  CO2 31 27 29 28 28   GLUCOSE 191* 192* 193* 248* 157*  BUN 31* 27* 30* 33* 32*  CREATININE 0.98 0.90 0.90 1.00 0.94  CALCIUM 8.6* 8.3* 8.5* 9.1 9.3    GFR: Estimated Creatinine Clearance: 57.7 mL/min (by C-G formula based on SCr of 0.94 mg/dL).  Liver Function Tests: Recent Labs  Lab 09/04/20 0411 09/05/20 0427 09/06/20 0447 09/07/20 0528 09/10/20 0859  AST 74* 68* 52* 42* 44*  ALT 54* 55* 55* 49* 58*  ALKPHOS 85 100 89 84 107  BILITOT 0.3 0.3  0.6 0.5 0.4  PROT 5.4* 5.5* 5.2* 5.1* 5.6*  ALBUMIN 2.0* 2.0* 1.9* 1.9* 2.0*    CBG: Recent Labs  Lab 09/09/20 1207 09/09/20 1600 09/09/20 2120 09/10/20 0800 09/10/20 1153  GLUCAP 392* 262* 134* 170* 57*     No results found for this or any previous visit (from the past 240 hour(s)).         Radiology Studies: DG CHEST PORT 1 VIEW  Result Date: 09/09/2020 CLINICAL DATA:  Respiratory failure with hypoxia EXAM: PORTABLE CHEST 1 VIEW COMPARISON:  Multiple chest radiographs, most recently 09/04/2020. CTA chest, 08/18/2020. FINDINGS: Cardiomediastinal silhouette is unchanged. Access, right IJ port, with catheter tip position unchanged. Patchy pulmonary opacities, with a basilar predominance. Bilateral nodular consolidations, best appreciated on recent compression CT chest. No large pleural effusion. No pneumothorax. Overlying support lines. Cervical fusion hardware, incompletely imaged. No interval osseous  abnormality. IMPRESSION: Unchanged pulmonary findings, with bilateral airspace disease and multifocal pulmonary metastasis. Electronically Signed   By: Michaelle Birks M.D.   On: 09/09/2020 08:06        Scheduled Meds:  (feeding supplement) PROSource Plus  30 mL Oral TID BM   amiodarone  200 mg Oral Daily   amoxicillin-clavulanate  1 tablet Oral Q12H   budesonide (PULMICORT) nebulizer solution  0.5 mg Nebulization BID   buPROPion  300 mg Oral Daily   chlorhexidine  15 mL Mouth Rinse BID   Chlorhexidine Gluconate Cloth  6 each Topical Daily   diltiazem  240 mg Oral Daily   dronabinol  2.5 mg Oral BID AC   enoxaparin (LOVENOX) injection  40 mg Subcutaneous Q24H   feeding supplement  237 mL Oral TID BM   filgrastim  480 mcg Subcutaneous Daily   gabapentin  300 mg Oral TID   guaiFENesin  1,200 mg Oral BID   insulin aspart  0-15 Units Subcutaneous TID WC   insulin aspart  5 Units Subcutaneous TID WC   ipratropium  0.5 mg Nebulization TID   levalbuterol  0.63 mg Nebulization TID   lidocaine  3 patch Transdermal Q24H   loratadine  10 mg Oral Daily   mouth rinse  15 mL Mouth Rinse q12n4p   methylPREDNISolone (SOLU-MEDROL) injection  60 mg Intravenous Q24H   multivitamin with minerals  1 tablet Oral Daily   neomycin-bacitracin-polymyxin   Topical Daily   pantoprazole  40 mg Oral Daily   polyethylene glycol  17 g Oral Daily   senna  2 tablet Oral Daily   sodium chloride flush  10-40 mL Intracatheter Q12H   sodium chloride flush  3 mL Intravenous Q12H   Continuous Infusions:  sodium chloride       LOS: 17 days    Time spent: 45 minutes    Geradine Girt, DO [facility]   To contact the attending provider between 7A-7P or the covering provider during after hours 7P-7A, please log into the web site [email or web address] and access using universal [place] password for that web site. If you do not have the password, please call the hospital operator.  09/10/2020, 1:16 PM

## 2020-09-10 NOTE — Consult Note (Signed)
Palliative Care Consultation Note Reason for Consult: Uncontrolled Pain, Dyspnea, Acute decline  Grant Taylor is a 75 yo man with newly diagnosed small cell lung cancer admitted with acute respiratory failure. Received first chemotherapy on 8/9, tolerated without major side effects but has had severe neutropenia and has been declining since that initial admission. 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 He has muscle wasting. He lived independently prior to this admission but had been struggling with back pain and myopathy.  I was called to see patient by RN who stated that he was declining from a respiratory standpoint and experiencing severe suffering-complaining of back pain that was not relieved with PO oxycodone.  I met both of his daughters at bedside and when I arrived he appeared to be in a significant amount of distress-he could not complete a full sentence without desaturating or being symptomatic, he was wincing with pain in his upper back and neck, diaphoretic and very uncomfortable appearing.  I explained how serious things were to the patient and his daughters. The patient was explicitly clear that he was "ready (to die)" and he he said "I am ready now". His daughters were emotional but very supportive. I shared with them it was gift that he was able to tell them what he wanted and to let them know he was at peace as much as he could be. It was also clear that he could not continue to suffer in his current condition-he fully endorsed using what ever medications needed to relieve his pain even if that made him more sedated.  I believe the entire medical team has given him every best chance and we have provided the highest possible level of medical  and oncologic care -but Grant Taylor is getting worse each day and is requesting that we provide comfort as our first and foremost goal and allow for a natural death to occur if that is the trajectory his illness is taking. Even in the best of circumstances and even if his neutropenia improves- he still has profound debility and would never live independently or really ever be able to tolerate an ongoing treatment plan.   Recommendations: DNR, Allow for a natural death to occur with comfort measures in the event of cardiac or respiratory arrest. Will continue treatment of reversible issues but not at the expensive of his comfort and QOL. I reviewed the oncologic plan and have made no changes to current medical interventions but believe a full comfort care approach would be both a humane and medically appropriate plan of care. Initiated a low dose morphine infusion at 0.$RemoveBefor'5mg'[id number]$  hr, with bolus dosing and discussed with RN a titration plan to treat his pain and dyspnea.  Based on my assessment of him this afternoon I anticipate a hospital death.  Lane Hacker, DO Palliative Medicine  Time: 70 minutes Greater than 50%  of this time was spent counseling and coordinating care related to the above assessment and plan.

## 2020-09-10 NOTE — Progress Notes (Signed)
This RN was in room when Dr. Hilma Favors spoke with patient and both daughters in room, patient stated that he was ready to die, and wanted to be comfortable, patients daughters both expressed that they honored their fathers wishes.

## 2020-09-10 NOTE — Progress Notes (Addendum)
Spoke with patient and daughter patient does not to be intubated, patient does not want feeding tube placed,  patient stated that he was ready to move forward with the natural progression of his illness,and that he would like be comfortable. Informed CCM of patients wishes.

## 2020-09-10 NOTE — Progress Notes (Signed)
Patient has not been able to tolerate being off bipap for more than 2hrs without O2 desating and becoming SOB, patient unable to eat today due to SOB. Patient feels most comfortable on bipap. When patient is not on bipap patient is on 50liters FIO2 100% heated high flow with non-rebreather as supplement.

## 2020-09-10 NOTE — Progress Notes (Signed)
Unfortunately, Grant Taylor is just having a tough time improving.  His respiratory status is really the problem.  He seems to be requiring more supplemental oxygen.  He is on BiPAP.  He really cannot be weaned.  I totally understand the problem that we have.  He has underlying lung disease that is not that good to begin with.  If there is malignancy on top of that, this will certainly add to the problem.  I do agree that he should not go on a ventilator.  I doubt he would ever come off a ventilator if he were to go onto 1.  I talked to him about this this morning.  He said he did not want to go on a ventilator.  I do not have any lab on him back yet.  I just do not want to see any changes being made yet while he is not neutropenic.  I would want to try to get him through the neutropenic duration and then we can decide what we can or cannot do.  I am sending off a prealbumin on him to see how that looks.  I do not think he is really eating much.  It would not surprise me if his prealbumin is below 10.  I know that he is trying his best.  I know that the staff in the ICU are doing a fantastic job with him.  Again, he does has underlying issues in addition to the cancer that are very very difficult to reverse.  Typically, small cell carcinoma tends to respond pretty quickly to treatment.  I think once he has the neutropenia resolved, I would repeat a scan on him to see if there is any improvement in his status.  Thankfully, he is not in atrial fibrillation.  He is not febrile.  His vital signs all look pretty stable.  On the BiPAP, his oxygen level is 94%.  I realize this is a very tenuous problem that we have.  Again, everybody is doing all that is possible to try to get him through this.  It is just possible that he may not make it through this.  I think he is beginning to have a better understanding of this when I talk to him this morning.  Again, I would wait for the neutropenia to resolve and  then we can see how everything looks and then make decisions based on that.  I do appreciate the incredible hard work that is being done by all the staff down the ICU.  This is a very complicated problem and I do appreciate everybody's efforts.  Lattie Haw, MD  Psalm 68:19

## 2020-09-10 NOTE — Progress Notes (Signed)
Chaplain engaged in a follow-up with [name].  Chaplain was also introduced to his two daughters at bedside.  Chaplain asked Bence if he was afraid and he stated, "No."  Rihan has voiced that he has been in a lot of pain.  Chaplain can assess that Nadeem holds an awareness of what is happening to him and his body.  Chaplain offered support.  Chaplain will follow-up.    09/10/20 1700  Clinical Encounter Type  Visited With Patient;Family  Visit Type Follow-up

## 2020-09-11 DIAGNOSIS — N179 Acute kidney failure, unspecified: Secondary | ICD-10-CM

## 2020-09-11 DIAGNOSIS — D701 Agranulocytosis secondary to cancer chemotherapy: Secondary | ICD-10-CM

## 2020-09-11 DIAGNOSIS — J9621 Acute and chronic respiratory failure with hypoxia: Secondary | ICD-10-CM | POA: Diagnosis not present

## 2020-09-11 DIAGNOSIS — G4733 Obstructive sleep apnea (adult) (pediatric): Secondary | ICD-10-CM

## 2020-09-11 DIAGNOSIS — D696 Thrombocytopenia, unspecified: Secondary | ICD-10-CM

## 2020-09-11 DIAGNOSIS — T451X5A Adverse effect of antineoplastic and immunosuppressive drugs, initial encounter: Secondary | ICD-10-CM

## 2020-09-11 DIAGNOSIS — D709 Neutropenia, unspecified: Secondary | ICD-10-CM

## 2020-09-11 DIAGNOSIS — J449 Chronic obstructive pulmonary disease, unspecified: Secondary | ICD-10-CM

## 2020-09-11 DIAGNOSIS — R739 Hyperglycemia, unspecified: Secondary | ICD-10-CM

## 2020-09-11 LAB — CBC WITH DIFFERENTIAL/PLATELET
Abs Immature Granulocytes: 0 10*3/uL (ref 0.00–0.07)
Basophils Absolute: 0 10*3/uL (ref 0.0–0.1)
Basophils Relative: 0 %
Eosinophils Absolute: 0 10*3/uL (ref 0.0–0.5)
Eosinophils Relative: 0 %
HCT: 33.5 % — ABNORMAL LOW (ref 39.0–52.0)
Hemoglobin: 10.3 g/dL — ABNORMAL LOW (ref 13.0–17.0)
Lymphocytes Relative: 24 %
Lymphs Abs: 0.2 10*3/uL — ABNORMAL LOW (ref 0.7–4.0)
MCH: 26.5 pg (ref 26.0–34.0)
MCHC: 30.7 g/dL (ref 30.0–36.0)
MCV: 86.3 fL (ref 80.0–100.0)
Monocytes Absolute: 0.1 10*3/uL (ref 0.1–1.0)
Monocytes Relative: 9 %
Neutro Abs: 0.5 10*3/uL — ABNORMAL LOW (ref 1.7–7.7)
Neutrophils Relative %: 67 %
Platelets: 67 10*3/uL — ABNORMAL LOW (ref 150–400)
RBC: 3.88 MIL/uL — ABNORMAL LOW (ref 4.22–5.81)
RDW: 17 % — ABNORMAL HIGH (ref 11.5–15.5)
WBC: 0.8 10*3/uL — CL (ref 4.0–10.5)
nRBC: 0 % (ref 0.0–0.2)

## 2020-09-11 LAB — COMPREHENSIVE METABOLIC PANEL
ALT: 174 U/L — ABNORMAL HIGH (ref 0–44)
AST: 115 U/L — ABNORMAL HIGH (ref 15–41)
Albumin: 2.1 g/dL — ABNORMAL LOW (ref 3.5–5.0)
Alkaline Phosphatase: 126 U/L (ref 38–126)
Anion gap: 11 (ref 5–15)
BUN: 35 mg/dL — ABNORMAL HIGH (ref 8–23)
CO2: 28 mmol/L (ref 22–32)
Calcium: 8.9 mg/dL (ref 8.9–10.3)
Chloride: 104 mmol/L (ref 98–111)
Creatinine, Ser: 1.32 mg/dL — ABNORMAL HIGH (ref 0.61–1.24)
GFR, Estimated: 56 mL/min — ABNORMAL LOW (ref >60)
Glucose, Bld: 242 mg/dL — ABNORMAL HIGH (ref 70–99)
Potassium: 5.5 mmol/L — ABNORMAL HIGH (ref 3.5–5.1)
Sodium: 143 mmol/L (ref 135–145)
Total Bilirubin: 0.4 mg/dL (ref 0.3–1.2)
Total Protein: 5.3 g/dL — ABNORMAL LOW (ref 6.5–8.1)

## 2020-09-11 MED ORDER — IPRATROPIUM-ALBUTEROL 0.5-2.5 (3) MG/3ML IN SOLN
RESPIRATORY_TRACT | Status: AC
  Administered 2020-09-11: 3 mL via RESPIRATORY_TRACT
  Filled 2020-09-11: qty 3

## 2020-09-11 MED ORDER — MORPHINE BOLUS VIA INFUSION
2.0000 mg | INTRAVENOUS | Status: DC | PRN
  Administered 2020-09-11 (×3): 2 mg via INTRAVENOUS
  Filled 2020-09-11: qty 2

## 2020-09-11 MED ORDER — IPRATROPIUM-ALBUTEROL 0.5-2.5 (3) MG/3ML IN SOLN
3.0000 mL | RESPIRATORY_TRACT | Status: DC
  Filled 2020-09-11: qty 3

## 2020-09-11 NOTE — Assessment & Plan Note (Signed)
Stable Continue Cardizem 

## 2020-09-11 NOTE — Plan of Care (Signed)
Discussed earlier in the shift with patient and daughters in the room, pain management with morphine drip and as needed medications by mouth with some teach back displayed.  Patient is comfort care from palliative consult.   Problem: Education: Goal: Knowledge of General Education information will improve Description: Including pain rating scale, medication(s)/side effects and non-pharmacologic comfort measures Outcome: Progressing   Problem: Health Behavior/Discharge Planning: Goal: Ability to manage health-related needs will improve Outcome: Progressing   Problem: Coping: Goal: Level of anxiety will decrease Outcome: Progressing   Problem: Pain Managment: Goal: General experience of comfort will improve Outcome: Progressing

## 2020-09-11 NOTE — Assessment & Plan Note (Signed)
--   Stable.  Secondary to chemotherapy.

## 2020-09-11 NOTE — Assessment & Plan Note (Signed)
--   Continue management per dietitian

## 2020-09-11 NOTE — Progress Notes (Signed)
I believe we now see that Grant Taylor will likely pass away within the week.  He now is on a low-dose morphine infusion for his breathing.  He seems comfortable this morning.  He and his daughter is had a really nice meeting with Dr. Hilma Favors yesterday.  I totally appreciate all that she did.  He is now a DNR which is very appropriate.  I just hate the fact that we cannot help more.  He has just significant underlying lung disease that has been the problem.  I am sure that there probably is tumor involvement in his lung also.  He is comfortable this morning.  He and I had a good prayer.  He did open his eyes when I was with him.  I think he did recognize me.  His oxygen saturation was 79% this morning.  I think this is indicative of the difficulties that he has had with getting adequate oxygenation.  I am sure that the morphine infusion can be increased if need be.  His prealbumin is only 12.6.  This is on the low side.  His body just has very little reserve left.  His last white cell count was 0.7.  I do not see that we have to worry about giving him any Neupogen.  It be nice to try to cut back on quite a few his medications.  I really think that we can make life little bit easier for him and the nurses.  I just hate the fact that he cannot get better.  Again, he served our country.  He was in the [employer].  He was a true American hero.  He and I had a good prayer this morning.  He is not worried about passing on.  He has a strong faith.  He is certainly an encouragement to each and everyone of [place].  I just want to make sure that he has comfort, dignity and respect.  I know that the staff in the ICU was done a outstanding job with him.  This is been incredibly complicated situation and the staff done a wonderful job in trying to help Grant Taylor is much as possible.  Lattie Haw, MD  2 Timothy 4:16-18

## 2020-09-11 NOTE — Progress Notes (Addendum)
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  Treated with antibiotics, aggressive respiratory support including high flow nasal cannula and BiPAP, seen by oncology.  Plans made for initiation of inpatient chemotherapy.  Developed atrial fibrillation new diagnosis requiring transfer to ICU for rate control.  Seen by cardiology, pulmonary critical care, and palliative medicine.  Despite aggressive treatment, patient is failed to improve, has severe respiratory failure, BiPAP dependent, condition declining.  Made DNR, started on morphine infusion.  Inpatient death expected.  A & P  * Small cell carcinoma (HCC) -- Causing acute on chronic hypoxic respiratory failure as dominant factor -- Followed by oncology, started on chemotherapy 8/6, rapidly declining, no further treatments planned -- Incurable, progressing with worsening respiratory failure.  Comfort approach per oncology.  COPD (chronic obstructive pulmonary disease) (HCC) -- Breath sounds clear.  Appears stable.  Continue bronchodilators.  Can continue steroids.  Acute on chronic respiratory failure with hypoxia (HCC) -- Multifactorial, dominant factor small cell cancer, lymphangitic spread, metastatic pulmonary disease, complicated by underlying COPD, Enterococcus faecalis pneumonia.  CT showed patchy opacities throughout the right lung concerning for lymphangitic spread.   Bulky mediastinal adenopathy.  Bulky hilar adenopathy.  Multiple bilateral pulmonary metastases.  Large pancreatic mass.  Echocardiogram unrevealing, no right-sided dysfunction.  No evidence to suggest heart failure. --Despite broad-spectrum antibiotics, diuresis, high-dose steroid, remains BiPAP dependent worsening over time secondary to metastatic disease.  Agree with pulmonology, no further pulmonary intervention.  Can continue Solu-Medrol, antibiotics per oncology. -- Remains BiPAP dependent  AKI (acute kidney injury) ([place]) -- With associated mild hyperkalemia, secondary to poor oral intake, insensible losses --Supportive care, do not see value to any further laboratory studies.  New onset a-fib [facility]) -- New diagnosis, CHA2DS2-VASc 3, secondary to acute pulmonary issues.  Echocardiogram unrevealing, TSH within normal limits. -- Seen by cardiology.  Initially required Cardizem and amiodarone infusions.  Converted fairly quickly to sinus rhythm which has been maintained, continues on amiodarone and diltiazem for rate control -- Not on anticoagulation secondary to hemoptysis.  Now based on goals of care anticoagulation is not indicated. -- Note is made of elevated LFTs present on admission, if the patient were to improve, which is not expected, he would need outpatient follow-up for LFTs and thyroid tests.  Thrombocytopenia (HCC) -- Stable.  Secondary to chemotherapy.  Protein-calorie malnutrition, severe -- Continue management per dietitian  Essential hypertension -- Stable.  Continue Cardizem.  Hyperglycemia -- secondary to steroids. Stable. No treatment based on GOC.  OSA (obstructive sleep apnea) -- on BIPAP at night prior to admission -- BiPAP  Neutropenia (HCC) -- Stable.  Secondary to chemotherapy.  No treatment indicated per oncology.   Nutritional Assessment: Body mass index is 21.06 kg/m.Marland Kitchen Seen by dietician.  I agree with the assessment and plan as outlined  below: Nutrition Status: Nutrition Problem: Severe Malnutrition Etiology: chronic illness (metastatic cancer) Signs/Symptoms: severe fat depletion, severe muscle depletion Interventions: Ensure Enlive (each supplement provides 350kcal and 20 grams of protein), MVI  Skin Assessment: I agree with the wound assessment as performed by the  wound care RN as outlined below: Pressure Injury 08/27/20 Coccyx (Active)  08/27/20 0500  Location: Coccyx  Location Orientation:   Staging:   Wound Description (Comments):   Present on Admission:      Pressure Injury 09/07/20 Nose Medial Stage 2 -  Partial thickness loss of dermis presenting as a shallow open injury with a red, pink wound bed without slough. small, open sore from BIPAP mask (Active)  09/07/20 0900  Location: Nose  Location Orientation: Medial  Staging: Stage 2 -  Partial thickness loss of dermis presenting as a shallow open injury with a red, pink wound bed without slough.  Wound Description (Comments): small, open sore from BIPAP mask  Present on Admission: No   Disposition Plan:  Discussion: Discussed with daughter at bedside.  We discussed limiting medications, directed at comfort.  We discussed BiPAP, she wishes to leave this in place for now.  We will follow-up later today.  Inpatient death expected in the next 5 days.  ADDENDUM 1900 Pt uncomfortable, d/w daughter will continue care directed at comfort, she is in agreement with full comfort care.   Status is: Inpatient  Remains inpatient appropriate because:Inpatient level of care appropriate due to severity of illness  Dispo:  Patient From: Home  Planned Disposition: To be determined  Medically stable for discharge: No         DVT prophylaxis: enoxaparin (LOVENOX) injection 40 mg Start: 08/30/20 1900     Code Status: DNR Level of care: Stepdown Family Communication: as above  Murray Hodgkins, MD  [facility] Direct contact: see [email or web address] (further  directions at bottom of note if needed) 7PM-7AM contact night coverage as at bottom of note 09/11/2020, 10:25 AM  LOS: 18 days   Significant Hospital Events    7/30 admitted to [facility] with SOB. CTA neg for PE. Incr size of lung masses. PNA vs lymphangitic spread. Started on vanc cefepime zosyn 7/31 Oncology consulted, plan to start chemo 8/4 new Afib RVR, worse rep distress and hypoxia, on BiPAP, transferred to ICU. Cardiology and PCCM consulted  8/5 converted to SR Started chemotherapy 8/11 PCCM reconsulted for failure to come off BiPAP 09/08/2020-overall no progress.  Still requiring high flow nasal cannula and majority of time on BiPAP.  Developing ulcer on his nasal bridge 8/16 seen by palliative medicine, made DNR, transitioning to towards comfort approach.  Morphine infusion started.  Remains on BiPAP. Consults:  ONCOLOGY Cardiology  PCCM PMT   Interval History/Subjective  CC: f/u respiratory failure  On BiPAP but awake and alert.  Breathing okay.  No pain.  Objective   Vitals:  Vitals:   09/11/20 0802 09/11/20 0803  BP:    Pulse:  95  Resp:  18  Temp:    SpO2: (!) 85% (!) 85%    Exam: Physical Exam Vitals and nursing note reviewed.  Constitutional:      General: He is in acute distress.     Appearance: He is ill-appearing.  Cardiovascular:     Rate and Rhythm: Normal rate and regular rhythm.     Heart sounds: No murmur heard.    Comments: Telemetry SR Pulmonary:     Effort: Respiratory distress present.     Breath sounds: No wheezing, rhonchi or rales.     Comments: Increased resp effort, on BiPAP Neurological:     Mental Status: He is alert.    I have personally reviewed the labs and other data, making special note of:   Today's Data  K+  5.5 Creatinine up to 1.32 AST/ALT up WBC 0.8 Plts 67  Scheduled Meds:  amiodarone  200 mg Oral Daily   diltiazem  240 mg Oral Daily   enoxaparin (LOVENOX) injection  40 mg Subcutaneous Q24H   mouth rinse  15 mL  Mouth Rinse q12n4p   methylPREDNISolone (SOLU-MEDROL) injection  60 mg Intravenous Q24H   neomycin-bacitracin-polymyxin   Topical Daily   pantoprazole  40 mg Oral Daily   polyethylene glycol  17 g Oral Daily   sodium chloride flush  10-40 mL Intracatheter Q12H   sodium chloride flush  3 mL Intravenous Q12H   Continuous Infusions:  sodium chloride     morphine 0.5 mg/hr (09/11/20 0043)    Principal Problem:   Small cell carcinoma (HCC) Active Problems:   Acute on chronic respiratory failure with hypoxia (HCC)   COPD (chronic obstructive pulmonary disease) (HCC)   New onset a-fib (HCC)   AKI (acute kidney injury) ([place])   Essential hypertension   Protein-calorie malnutrition, severe   Thrombocytopenia (HCC)   Pancreatic mass   Pneumonia   Leukocytosis   Depression   Pressure injury of skin   Neutropenia (HCC)   OSA (obstructive sleep apnea)   Hyperglycemia   LOS: 18 days   How to contact the [facility] Attending or Consulting provider 7A - 7P or covering provider during after hours 7P -7A, for this patient  Check the care team in [facility] and look for a) attending/consulting [place] provider listed and b) the [facility] team listed Log into [email or web address] and use [place]'s universal password to access. If you do not have the password, please contact the hospital operator. Locate the [facility] provider you are looking for under [facility] and page to a number that you can be directly reached. If you still have difficulty reaching the provider, please page the [facility] (Director on Call) for the Hospitalists listed on amion for assistance.

## 2020-09-11 NOTE — Assessment & Plan Note (Signed)
--   New diagnosis, CHA2DS2-VASc 3, secondary to acute pulmonary issues.  Echocardiogram unrevealing, TSH within normal limits. -- Seen by cardiology.  Initially required Cardizem and amiodarone infusions.  Converted fairly quickly to sinus rhythm which has been maintained, continues on amiodarone and diltiazem for rate control -- Not on anticoagulation secondary to hemoptysis.  Now based on goals of care anticoagulation is not indicated. -- Note is made of elevated LFTs present on admission, if the patient were to improve, which is not expected, he would need outpatient follow-up for LFTs and thyroid tests.

## 2020-09-11 NOTE — Assessment & Plan Note (Signed)
--   secondary to steroids. Stable. No treatment based on GOC.

## 2020-09-11 NOTE — Assessment & Plan Note (Signed)
--   Stable.  Secondary to chemotherapy.  No treatment indicated per oncology.

## 2020-09-11 NOTE — Assessment & Plan Note (Addendum)
--   Breath sounds clear.  Appears stable.  Continue bronchodilators.  Can continue steroids.

## 2020-09-11 NOTE — Progress Notes (Signed)
Chaplain engaged in a follow-up with Karam, his daughter, and son-in-law.  Daughter shared that Christy has been a great dad and example.  She defines herself as a true "daddy's girl."  Chaplain offered a prayer over Odd[place] and offered support to them.  Chaplain is available to follow-up.    09/11/20 1600  Clinical Encounter Type  Visited With Patient;Family  Visit Type Follow-up;Spiritual support  Stress Factors  Family Stress Factors Major life changes

## 2020-09-11 NOTE — Hospital Course (Addendum)
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  Treated with antibiotics, aggressive respiratory support including high flow nasal cannula and BiPAP, seen by oncology.  Plans made for initiation of inpatient chemotherapy.  Developed atrial fibrillation new diagnosis requiring transfer to ICU for rate control.  Seen by cardiology, pulmonary critical care, and palliative medicine.  Despite aggressive treatment, patient is failed to improve, has severe respiratory failure, BiPAP dependent, condition declining.  Made DNR, started on morphine infusion.  Inpatient death expected.

## 2020-09-11 NOTE — Assessment & Plan Note (Signed)
--   With associated mild hyperkalemia, secondary to poor oral intake, insensible losses --Supportive care, do not see value to any further laboratory studies.

## 2020-09-11 NOTE — Assessment & Plan Note (Signed)
--   Multifactorial, dominant factor small cell cancer, lymphangitic spread, metastatic pulmonary disease, complicated by underlying COPD, Enterococcus faecalis pneumonia.  CT showed patchy opacities throughout the right lung concerning for lymphangitic spread.  Bulky mediastinal adenopathy.  Bulky hilar adenopathy.  Multiple bilateral pulmonary metastases.  Large pancreatic mass.  Echocardiogram unrevealing, no right-sided dysfunction.  No evidence to suggest heart failure. --Despite broad-spectrum antibiotics, diuresis, high-dose steroid, remains BiPAP dependent worsening over time secondary to metastatic disease.  Agree with pulmonology, no further pulmonary intervention.  Can continue Solu-Medrol, antibiotics per oncology. -- Remains BiPAP dependent

## 2020-09-11 NOTE — Assessment & Plan Note (Signed)
--   Causing acute on chronic hypoxic respiratory failure as dominant factor -- Followed by oncology, started on chemotherapy 8/6, rapidly declining, no further treatments planned -- Incurable, progressing with worsening respiratory failure.  Comfort approach per oncology.

## 2020-09-11 NOTE — Assessment & Plan Note (Signed)
--   on BIPAP at night prior to admission -- BiPAP

## 2020-09-11 NOTE — TOC Progression Note (Signed)
Transition of Care [facility]) - Progression Note    Patient Details  Name: Grant Taylor MRN: [id number] Date of Birth: 30-Aug-1945  Transition [facility] Care [facility] CM/SW Contact  Leeroy Cha, RN Phone Number: 09/11/2020, 7:40 AM  Clinical Narrative:    Transitioned from bipap to hfnc =nrbm at 15l/min, iv solu medrol, iv ms drip for comfort care.  Hospital death is expected. Family is aware.   Expected Discharge Plan: [place] Barriers to Discharge: Continued Medical Work up  Expected Discharge Plan and Services Expected Discharge Plan: [place]   Discharge Planning Services: CM Consult Post Acute Care Choice: Durable Medical Equipment, Home Health Living arrangements for the past 2 months: Single Family Home                                       Social Determinants of Health (SDOH) Interventions    Readmission Risk Interventions No flowsheet data found.

## 2020-09-12 ENCOUNTER — Telehealth: Payer: Self-pay | Admitting: *Deleted

## 2020-09-12 DIAGNOSIS — C801 Malignant (primary) neoplasm, unspecified: Secondary | ICD-10-CM | POA: Diagnosis not present

## 2020-09-12 MED ORDER — GLYCOPYRROLATE 0.2 MG/ML IJ SOLN: 0.3000 mg | INTRAMUSCULAR | Status: DC | PRN

## 2020-09-12 MED ORDER — LORAZEPAM 2 MG/ML IJ SOLN: 1.0000 mg | INTRAMUSCULAR | Status: DC | PRN

## 2020-09-17 LAB — ANTINUCLEAR ANTIBODIES, IFA: ANA Ab, IFA: NEGATIVE

## 2020-09-26 NOTE — Progress Notes (Signed)
Palliative Care Progress Note  Grant Taylor continues to be on BiPap. This is very uncomfortable for him. I discussed with patient and daughter at bedside that we could likely get him comfortable without this device and that it was probably not helping him much at this point. He remains alert, able to communicated in short sentences. He endorses suffering-he is very tired. He agrees to removing Bipap.   Bipap was removed and he was placed on heated high flow for his comfort while I stayed at bedside and titrated his infusion for his comfort. Over the course of about an hour I titrated hi infusion to 5mg /hr and I gave him 2 boluses of 2mg  each.  He was able to rest comfortably on this dose. He is not eating much and can tolerate very little movement in the bed.  Recommendations:  DNR, Comfort Measures. Titrate Morphine infusion for his comfort-he seems to be tolrating this well at this point, may need to add a benzodiazapine for sedation. I discussed his condition with his daughter at bedside-she doesn't want him to suffer and knows he is dying- her grief is appropriate. Provided support. Continue to provide high level of symptom management and comfort-I anticipate a hospital death.  Lane Hacker, DO Palliative Medicine  Time: 35 minutes Greater than 50%  of this time was spent counseling and coordinating care related to the above assessment and plan.

## 2020-09-26 NOTE — Progress Notes (Signed)
Patient passed away in room. Both daughters and grandson were at the bedside. Belongings sent with Grant Taylor - daughter.

## 2020-09-26 NOTE — Progress Notes (Signed)
Mr. Grant Taylor now has periods of apnea.  He is on 8 mg an hour of morphine for infusion.  He is comfortable.  His family is with him.  This will be the last day that he is on this planet.  He will be going to his [facility].  He is ready.  He is not afraid.  His family is with him.  They understand well that this will be his last day.  They are sad but they know that he is comfortable.  They appreciate all the great care that the staff in the ICU gave him.  I just hate that we cannot do more for him.  I know that everybody tried their best to try to get him through this.  His lungs were just not going to cooperate.  Again, he is comfortable.  He is not responsive.  He has periods of apnea.  I am sure that the oxygen that he is on is keeping him going.  I am just glad that I was able to know him for the short amount of time that I did know him.  He is such a good man.  He has a true American hero.  He served our country and fought in [place].  Because of his sacrifice, I can do what I do now.  I really do thank the ICU staff are all their hard work.  They really did an amazing job with him.  This was a very complicated situation and they really showed somewhat compassion.  Grant Haw, MD  Psalm 100:5

## 2020-09-26 NOTE — Progress Notes (Signed)
Patient non-responsive on HHF and NRB. Morphine drip increased and NRB removed.

## 2020-09-26 NOTE — Death Summary Note (Signed)
DEATH SUMMARY   Patient Details  Name: Grant Taylor MRN: [id number] DOB: 1945-02-18  Admission/Discharge Information   Admit Date:  25-Aug-2020  Date of Death: Date of Death: 09/13/2020  Time of Death: Time of Death: 0751  Length of Stay: 04/15/22  Referring Physician: [name], MD   Reason(s) for Hospitalization  Pneumonia, lymphangitic spread of cancer, acute hypoxic respiratory failure  Diagnoses  Preliminary cause of death:  Small cell carcinoma with lymphangitic spread  Secondary Diagnoses (including complications and co-morbidities):  Principal Problem:   Small cell carcinoma ([place]) Active Problems:   Acute on chronic respiratory failure with hypoxia (HCC)   COPD (chronic obstructive pulmonary disease) ([place])   New onset a-fib ([place])   AKI (acute kidney injury) ([place])   Essential hypertension   Protein-calorie malnutrition, severe   Thrombocytopenia ([place])   Pancreatic mass   Pneumonia   Leukocytosis   Depression   Pressure injury of skin   Neutropenia (HCC)   OSA (obstructive sleep apnea)   Hyperglycemia   Brief Hospital Course (including significant findings, care, treatment, and services provided and events leading to death)  75 year old man [place] veteran PMH COPD, chronic hypoxic respiratory failure, recent admission 7/15-7/28 for acute hypoxic respiratory failure, new diagnosis of pulmonary metastatic disease secondary to 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  Treated with antibiotics, aggressive respiratory support including high flow nasal cannula and BiPAP, seen by oncology.  Plans made for initiation of inpatient chemotherapy.  Developed atrial fibrillation new diagnosis requiring transfer to ICU for  rate control.  Seen by cardiology, pulmonary critical care, and palliative medicine.  Despite aggressive treatment, patient failed to improve, developing severe respiratory failure, BiPAP dependence and continued decline.  In consultation with family, palliative medicine and oncology, made DNR, full comfort care with expectation of in-hospital death.  Passed away 2022-09-14.  Small cell carcinoma (HCC) -- Causing acute on chronic hypoxic respiratory failure as dominant factor -- Followed by oncology, started on chemotherapy 8/6, rapidly declining, no further treatments planned   COPD (chronic obstructive pulmonary disease) ([place])   Acute on chronic respiratory failure with hypoxia (HCC) -- Multifactorial, dominant factor small cell cancer, lymphangitic spread, metastatic pulmonary disease, complicated by underlying COPD, Enterococcus faecalis pneumonia.  CT showed patchy opacities throughout the right lung concerning for lymphangitic spread.  Bulky mediastinal adenopathy.  Bulky hilar adenopathy.  Multiple bilateral pulmonary metastases.  Large pancreatic mass.  Echocardiogram unrevealing, no right-sided dysfunction.  No evidence to suggest heart failure. --Despite broad-spectrum antibiotics, diuresis, high-dose steroid, remained BiPAP dependent worsening over time secondary to metastatic disease.    AKI (acute kidney injury) ([place]) -- With associated mild hyperkalemia, secondary to poor oral intake, insensible losses   New onset a-fib (HCC) -- New diagnosis, CHA2DS2-VASc 3, secondary to acute pulmonary issues.  Echocardiogram unrevealing, TSH within normal limits. -- Seen by cardiology.  Initially required Cardizem and amiodarone infusions.  Converted fairly quickly to sinus rhythm which has been maintained, continues on amiodarone and diltiazem for rate control -- Not on anticoagulation secondary to hemoptysis.     Thrombocytopenia ([place]) -- Secondary to chemotherapy.   Protein-calorie malnutrition,  severe  Essential hypertension   Hyperglycemia -- secondary to steroids.    OSA (obstructive sleep apnea)   Neutropenia (HCC) -- Secondary to chemotherapy.    Pertinent Labs and Studies  Significant Diagnostic Studies DG Chest 2 View  Result Date: 08/28/2020 CLINICAL DATA:  Hypoxemic respiratory failure with sepsis and pneumonia. EXAM: CHEST - 2 VIEW COMPARISON:  08/23/2020 FINDINGS: Nodular density is seen throughout both lungs. There is bibasilar airspace disease right greater than left. Small left effusion. Port-A-Cath tip in the SVC. IMPRESSION: No significant interval change. Bilateral lung nodules and bilateral airspace disease right greater than left. Electronically Signed   By: Franchot Gallo M.D.   On: 08/28/2020 13:35   CT Angio Chest PE W and/or Wo Contrast  Result Date: 08/23/2020 CLINICAL DATA:  Patient with lung and pancreatic cancer. EXAM: CT ANGIOGRAPHY CHEST WITH CONTRAST TECHNIQUE: Multidetector CT imaging of the chest was performed using the standard protocol during bolus administration of intravenous contrast. Multiplanar CT image reconstructions and MIPs were obtained to evaluate the vascular anatomy. CONTRAST:  18mL OMNIPAQUE IOHEXOL 350 MG/ML SOLN COMPARISON:  Chest CT August 09, 2020. FINDINGS: Cardiovascular: Right anterior chest wall Port-A-Cath is present with tip terminating in the superior vena cava. Normal heart size. Thoracic aortic vascular calcifications. Adequate opacification of the pulmonary arteries. Narrowing of the proximal aspect of the right lower lobe pulmonary artery secondary to bulky mediastinal masses. Mediastinum/Nodes: There is a large poorly visualized left supraclavicular lymph node measuring up to 2.5 cm. No axillary adenopathy. Bulky mediastinal and bilateral hilar lymphadenopathy. Index prevascular lymph node has increased in size measuring 3.1 cm (image 41; series 7), previously 2.7 cm. Index right infrahilar lymph node measures 2.1 cm (image 62;  series 7), previously 1.8 cm. Normal appearance of the esophagus. Lungs/Pleura: Central airways are patent. Interval increase in size of multiple of the extensive bilateral pulmonary metastasis. Reference subpleural left upper lobe nodule measures 2.0 cm (image 34; series 4), previously 1.5 cm. Reference left lower lobe nodule measures 2.8 cm (image 61; series 7), previously 1.9 cm. Interval development of patchy consolidative opacities throughout the right upper, right middle and right lower lobes. No pleural effusion or pneumothorax. Upper Abdomen: Incompletely visualized large pancreatic mass. Musculoskeletal: No acute osseous lesions. Review of the MIP images confirms the above findings. IMPRESSION: Interval development of patchy consolidative opacities throughout the right lung which may represent superimposed infectious process or potentially lymphangitic spread of tumor. No evidence for acute pulmonary embolus. Bulky mediastinal adenopathy narrows the right lower lobe pulmonary artery. Interval increase in size of bulky mediastinal and hilar adenopathy as well as multiple bilateral pulmonary metastasis. Redemonstrated partially visualized large pancreatic mass. Electronically Signed   By: Lovey Newcomer M.D.   On: 08/04/2020 16:23   DG CHEST PORT 1 VIEW  Result Date: 09/09/2020 CLINICAL DATA:  Respiratory failure with hypoxia EXAM: PORTABLE CHEST 1 VIEW COMPARISON:  Multiple chest radiographs, most recently 09/04/2020. CTA chest, 08/14/2020. FINDINGS: Cardiomediastinal silhouette is unchanged. Access, right IJ port, with catheter tip position unchanged. Patchy pulmonary opacities, with a basilar predominance. Bilateral nodular consolidations, best appreciated on recent compression CT chest. No large pleural effusion. No pneumothorax. Overlying support lines. Cervical fusion hardware, incompletely imaged. No interval osseous abnormality. IMPRESSION: Unchanged pulmonary findings, with bilateral airspace  disease and multifocal pulmonary metastasis. Electronically Signed   By: Michaelle Birks M.D.   On: 09/09/2020 08:06   DG CHEST PORT 1 VIEW  Result Date: 09/04/2020 CLINICAL DATA:  Short of breath.  Lung cancer history. EXAM: PORTABLE CHEST 1 VIEW COMPARISON:  08/31/2020 FINDINGS: Bilateral lung infiltrates right greater than left appear unchanged. Bilateral nodules also unchanged. Small left pleural effusion is progressive. Port-A-Cath tip SVC. IMPRESSION:  Bilateral airspace disease and bilateral lung nodules unchanged. Progression of small left pleural effusion. Electronically Signed   By: Franchot Gallo M.D.   On: 09/04/2020 15:40   DG CHEST PORT 1 VIEW  Result Date: 08/31/2020 CLINICAL DATA:  Increased shortness of breath EXAM: PORTABLE CHEST 1 VIEW COMPARISON:  08/30/2020 FINDINGS: Patchy bilateral airspace disease and numerous pulmonary nodules again noted, unchanged. Heart is normal size. Right Port-A-Cath remains in place, unchanged. IMPRESSION: Patchy bilateral lung nodules and infiltrates. No significant change since prior study. Electronically Signed   By: Rolm Baptise M.D.   On: 08/31/2020 17:28   DG CHEST PORT 1 VIEW  Result Date: 08/30/2020 CLINICAL DATA:  Hypoxia EXAM: PORTABLE CHEST 1 VIEW COMPARISON:  08/29/2020 FINDINGS: Multiple pulmonary nodules as noted on CT. Bilateral airspace infiltrates right greater than left stable. No pneumothorax or pleural effusion. Heart size and vascularity normal. Port-A-Cath in the SVC. IMPRESSION: Stable chest x-ray. Bilateral lung nodules and bilateral infiltrates right greater than left. Electronically Signed   By: Franchot Gallo M.D.   On: 08/30/2020 08:07   DG Chest Port 1 View  Result Date: 08/29/2020 CLINICAL DATA:  Increasing shortness of breath EXAM: PORTABLE CHEST 1 VIEW COMPARISON:  Yesterday FINDINGS: Patchy right more than left pulmonary infiltrates with multiple nodules by chest CT. Normal heart size and stable mediastinal contours. Right  port with tip at the SVC. No visible effusion or pneumothorax. IMPRESSION: Stable pattern of pulmonary infiltrates and nodules. Electronically Signed   By: Monte Fantasia M.D.   On: 08/29/2020 05:13   DG Chest Port 1 View  Result Date: 08/18/2020 CLINICAL DATA:  Shortness of breath in a patient with a history of metastatic pancreatic cancer. EXAM: PORTABLE CHEST 1 VIEW COMPARISON:  Single-view of the chest 08/18/2020. FINDINGS: Port-A-Cath is unchanged. Multiple pulmonary nodules and masses are again seen. Coarsening of the pulmonary interstitium in the mid and lower lung zones is also again seen. No new airspace disease. No pneumothorax. Very small bilateral pleural effusions. No acute bony abnormality. IMPRESSION: No acute disease in patient with extensive metastatic pulmonary nodules. Electronically Signed   By: Inge Rise M.D.   On: 08/10/2020 13:35   DG CHEST PORT 1 VIEW  Result Date: 08/18/2020 CLINICAL DATA:  Worsening cough. EXAM: PORTABLE CHEST 1 VIEW COMPARISON:  08/13/2020 FINDINGS: 0841 hours. Peripheral airspace disease seen on the previous x-ray appears improved in the interval. There are persistent numeral bilateral pulmonary nodules with a mid and lower lung predominance. No substantial pleural effusion. Interstitial markings are diffusely coarsened with chronic features. Cardiopericardial silhouette is at upper limits of normal for size. Telemetry leads overlie the chest. IMPRESSION: 1. Interval improvement in peripheral airspace disease seen on the previous x-ray. 2. Persistent widespread bilateral pulmonary nodules compatible with metastatic disease. Electronically Signed   By: Misty Stanley M.D.   On: 08/18/2020 10:36   IR IMAGING GUIDED PORT INSERTION  Result Date: 08/21/2020 INDICATION: 75 year old with metastatic pancreatic cancer. Port-A-Cath needed for therapy. EXAM: FLUOROSCOPIC AND ULTRASOUND GUIDED PLACEMENT OF A SUBCUTANEOUS PORT COMPARISON:  None. MEDICATIONS:  Moderate sedation ANESTHESIA/SEDATION: Versed 2.0 mg IV; Fentanyl 100 mcg IV; Moderate Sedation Time:  26 minutes The patient was continuously monitored during the procedure by the interventional radiology nurse under my direct supervision. FLUOROSCOPY TIME:  54 seconds, 4 mGy COMPLICATIONS: None immediate. PROCEDURE: The procedure, risks, benefits, and alternatives were explained to the patient. Questions regarding the procedure were encouraged and answered. The patient understands and consents to the procedure. Patient  was placed supine on the interventional table. Ultrasound confirmed a patent right internal jugular vein. Ultrasound image was saved for documentation. The right chest and neck were cleaned with a skin antiseptic and a sterile drape was placed. Maximal barrier sterile technique was utilized including caps, mask, sterile gowns, sterile gloves, sterile drape, hand hygiene and skin antiseptic. The right neck was anesthetized with 1% lidocaine. Small incision was made in the right neck with a blade. Micropuncture set was placed in the right internal jugular vein with ultrasound guidance. The micropuncture wire was used for measurement purposes. The right chest was anesthetized with 1% lidocaine with epinephrine. #15 blade was used to make an incision and a subcutaneous port pocket was formed. [place] was assembled. Subcutaneous tunnel was formed with a stiff tunneling device. The port catheter was brought through the subcutaneous tunnel. The port was placed in the subcutaneous pocket. The micropuncture set was exchanged for a peel-away sheath. The catheter was placed through the peel-away sheath and the tip was positioned at the superior cavoatrial junction. Catheter placement was confirmed with fluoroscopy. The port was accessed and flushed with saline. The port pocket was closed using two layers of absorbable sutures and Dermabond. The vein skin site was closed using a single layer of  absorbable suture and Dermabond. Port-A-Cath was left accessed at the end of the procedure and flushed with normal saline. Sterile dressings were applied. Patient tolerated the procedure well without an immediate complication. Ultrasound and fluoroscopic images were taken and saved for this procedure. IMPRESSION: Placement of a subcutaneous port device. Catheter tip is positioned at the superior cavoatrial junction. Electronically Signed   By: Markus Daft M.D.   On: 08/21/2020 12:23   VAS [place] LOWER EXTREMITY VENOUS (DVT)  Result Date: 08/29/2020  Lower Venous DVT Study Patient Name:  SHED NIXON  Date of Exam:   08/29/2020 Medical Rec #: [id number]                 Accession #:    [id number] Date of Birth: [birth year]                 Patient Gender: M Patient Age:   75Y Exam Location:  [facility] Procedure:      VAS [place] LOWER EXTREMITY VENOUS (DVT) Referring Phys: GRACE BOWSER --------------------------------------------------------------------------------  Indications: SOB, history of metastatic cancer.  Comparison Study: No prior studies. Performing Technologist: Darlin Coco RDMS,RVT  Examination Guidelines: A complete evaluation includes B-mode imaging, spectral Doppler, color Doppler, and power Doppler as needed of all accessible portions of each vessel. Bilateral testing is considered an integral part of a complete examination. Limited examinations for reoccurring indications may be performed as noted. The reflux portion of the exam is performed with the patient in reverse Trendelenburg.  +---------+---------------+---------+-----------+----------+--------------+ RIGHT    CompressibilityPhasicitySpontaneityPropertiesThrombus Aging +---------+---------------+---------+-----------+----------+--------------+ CFV      Full           Yes      Yes                                 +---------+---------------+---------+-----------+----------+--------------+ SFJ      Full                                                         +---------+---------------+---------+-----------+----------+--------------+  FV Prox  Full                                                        +---------+---------------+---------+-----------+----------+--------------+ FV Mid   Full                                                        +---------+---------------+---------+-----------+----------+--------------+ FV DistalFull                                                        +---------+---------------+---------+-----------+----------+--------------+ PFV      Full                                                        +---------+---------------+---------+-----------+----------+--------------+ POP      Full           Yes      Yes                                 +---------+---------------+---------+-----------+----------+--------------+ PTV      Full                                                        +---------+---------------+---------+-----------+----------+--------------+ PERO     Full                                                        +---------+---------------+---------+-----------+----------+--------------+   +---------+---------------+---------+-----------+----------+--------------+ LEFT     CompressibilityPhasicitySpontaneityPropertiesThrombus Aging +---------+---------------+---------+-----------+----------+--------------+ CFV      Full           Yes      Yes                                 +---------+---------------+---------+-----------+----------+--------------+ SFJ      Full                                                        +---------+---------------+---------+-----------+----------+--------------+ FV Prox  Full                                                        +---------+---------------+---------+-----------+----------+--------------+  FV Mid   Full                                                         +---------+---------------+---------+-----------+----------+--------------+ FV DistalFull                                                        +---------+---------------+---------+-----------+----------+--------------+ PFV      Full                                                        +---------+---------------+---------+-----------+----------+--------------+ POP      Full           Yes      Yes                                 +---------+---------------+---------+-----------+----------+--------------+ PTV      Full                                                        +---------+---------------+---------+-----------+----------+--------------+ PERO     Full                                                        +---------+---------------+---------+-----------+----------+--------------+     Summary: RIGHT: - There is no evidence of deep vein thrombosis in the lower extremity.  - No cystic structure found in the popliteal fossa.  LEFT: - There is no evidence of deep vein thrombosis in the lower extremity.  - No cystic structure found in the popliteal fossa.  *See table(s) above for measurements and observations. Electronically signed by Servando Snare MD on 08/29/2020 at 3:49:51 PM.    Final    ECHOCARDIOGRAM LIMITED  Result Date: 08/29/2020    ECHOCARDIOGRAM LIMITED REPORT   Patient Name:   FINNIGAN WARRINER Date of Exam: 08/29/2020 Medical Rec #:  [id number]                Height:       67.0 in Accession #:    [id number]               Weight:       129.6 lb Date of Birth:  01-29-1945                BSA:          1.682 m Patient Age:    89 years                 BP:           121/75 mmHg Patient Gender: M  HR:           170 bpm. Exam Location:  Inpatient Procedure: Limited Echo, Color Doppler and Cardiac Doppler Indications:    I48.91* Unspecified atrial fibrillation  History:        Patient has prior history of Echocardiogram examinations, most                  recent 08/09/2020. COPD; Risk Factors:Hypertension, Dyslipidemia                 and Sleep Apnea.  Sonographer:    Raquel Sarna Senior RDCS Referring Phys: [id number] Sharion Dove DANIELS  Sonographer Comments: Heart rate has been significantly elevated since admission per RN. Echo performed to rule out structural cause. IMPRESSIONS  1. Left ventricular ejection fraction, by estimation, is 60 to 65%. The left ventricle has normal function. The left ventricle has no regional wall motion abnormalities. Left ventricular diastolic parameters are indeterminate.  2. Right ventricular systolic function is normal. The right ventricular size is normal. There is normal pulmonary artery systolic pressure. The estimated right ventricular systolic pressure is 18.8 mmHg.  3. The mitral valve is normal in structure. Trivial mitral valve regurgitation. No evidence of mitral stenosis.  4. The aortic valve is tricuspid. Aortic valve regurgitation is not visualized. Mild aortic valve sclerosis is present, with no evidence of aortic valve stenosis.  5. The inferior vena cava is normal in size with greater than 50% respiratory variability, suggesting right atrial pressure of 3 mmHg.  6. The patient was in atrial fibrillation. FINDINGS  Left Ventricle: Left ventricular ejection fraction, by estimation, is 60 to 65%. The left ventricle has normal function. The left ventricle has no regional wall motion abnormalities. The left ventricular internal cavity size was normal in size. There is  no left ventricular hypertrophy. Left ventricular diastolic parameters are indeterminate. Right Ventricle: The right ventricular size is normal. No increase in right ventricular wall thickness. Right ventricular systolic function is normal. There is normal pulmonary artery systolic pressure. The tricuspid regurgitant velocity is 2.69 m/s, and  with an assumed right atrial pressure of 3 mmHg, the estimated right ventricular systolic pressure is 41.6 mmHg. Left  Atrium: Left atrial size was normal in size. Right Atrium: Right atrial size was normal in size. Pericardium: There is no evidence of pericardial effusion. Mitral Valve: The mitral valve is normal in structure. Mild mitral annular calcification. Trivial mitral valve regurgitation. No evidence of mitral valve stenosis. Tricuspid Valve: The tricuspid valve is normal in structure. Tricuspid valve regurgitation is mild. Aortic Valve: The aortic valve is tricuspid. Aortic valve regurgitation is not visualized. Mild aortic valve sclerosis is present, with no evidence of aortic valve stenosis. Venous: The inferior vena cava is normal in size with greater than 50% respiratory variability, suggesting right atrial pressure of 3 mmHg. IAS/Shunts: No atrial level shunt detected by color flow Doppler. AORTIC VALVE LVOT Vmax:   119.50 cm/s LVOT Vmean:  76.867 cm/s LVOT VTI:    0.165 m TRICUSPID VALVE TR Peak grad:   28.9 mmHg TR Vmax:        269.00 cm/s  SHUNTS Systemic VTI: 0.17 m Loralie Champagne MD Electronically signed by Loralie Champagne MD Signature Date/Time: 08/29/2020/4:59:05 PM    Final     Lab Basic Metabolic Panel: Recent Labs  Lab 09/07/20 0528 09/08/20 0616 09/09/20 0500 09/10/20 0859 09/11/20 0700  NA 136 135 134* 140 143  K 4.5 4.7 4.9 4.7 5.5*  CL 99 97* 98 101 104  CO2 27 29 28 28 28   GLUCOSE 192* 193* 248* 157* 242*  BUN 27* 30* 33* 32* 35*  CREATININE 0.90 0.90 1.00 0.94 1.32*  CALCIUM 8.3* 8.5* 9.1 9.3 8.9   Liver Function Tests: Recent Labs  Lab 09/06/20 0447 09/07/20 0528 09/10/20 0859 09/11/20 0700  AST 52* 42* 44* 115*  ALT 55* 49* 58* 174*  ALKPHOS 89 84 107 126  BILITOT 0.6 0.5 0.4 0.4  PROT 5.2* 5.1* 5.6* 5.3*  ALBUMIN 1.9* 1.9* 2.0* 2.1*   CBC: Recent Labs  Lab 09/06/20 0447 09/07/20 0528 09/08/20 0616 09/09/20 0500 09/09/20 0543 09/10/20 0859 09/11/20 0700  WBC 11.5* 7.8 2.2* 0.6* 0.6* 0.7* 0.8*  NEUTROABS 11.0* 7.5 1.8 0.4*  --   --  0.5*  HGB 10.4* 10.2* 10.2*  9.9* 9.8* 10.0* 10.3*  HCT 32.7* 33.0* 32.5* 31.9* 31.2* 32.0* 33.5*  MCV 83.8 86.8 85.5 84.4 85.5 84.9 86.3  PLT 159 136* 109* 87* 85* 76* 67*   Recent Labs  Lab 09/09/20 0500 09/09/20 0543 09/10/20 0859 09/11/20 0700  WBC 0.6* 0.6* 0.7* 0.8*    Procedures/Operations     Murray Hodgkins 10/07/20, 6:52 PM

## 2020-09-26 NOTE — Progress Notes (Signed)
Morphine gtt stopped. 30cc wasted with this RN and Polly Cobia, RN in med room Astronomer.

## 2020-09-26 NOTE — Telephone Encounter (Signed)
Message received from patient's daughter, Lita Mains to notify Dr. Marin Olp that pt has passed away.  Dr. Marin Olp notified.

## 2020-09-26 NOTE — Progress Notes (Signed)
Chaplain was able to check-in with both of Grant Taylor' daughters before they left the hospital.  Both daughters spoke of how great their dad was.  Chaplain affirmed the sweet and great spirit of their father. Chaplain offered support, listening and presence to them.      2020-09-20 0900  Clinical Encounter Type  Visited With Family  Visit Type Death

## 2020-09-26 DEATH — deceased

## 2020-10-07 ENCOUNTER — Ambulatory Visit: Payer: No Typology Code available for payment source | Admitting: Internal Medicine

## 2021-01-23 NOTE — Telephone Encounter (Signed)
done

## 2021-08-22 IMAGING — US US BIOPSY LYMPH NODE
1 series · 14 of 14 positions shown · non-contrast
Comparison: none

INDICATION: Pancreatic mass, liver lesions, pulmonary masses and lymphadenopathy
including palpable enlarged left supraclavicular lymph node.

[Series 1: us core biopsy lym nod mc & wl · 14 of 14 slices shown]
[im 1/14]
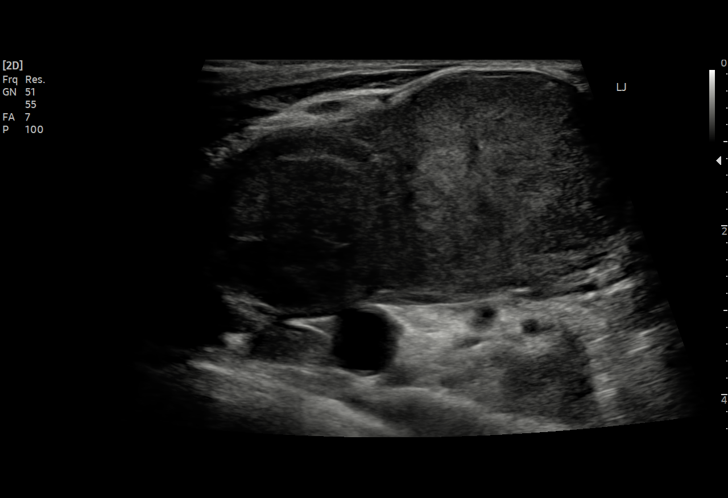
[im 2/14]
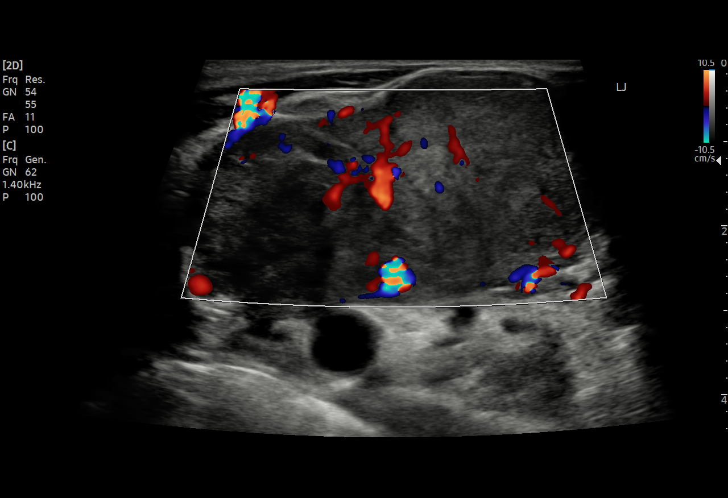
[im 3/14]
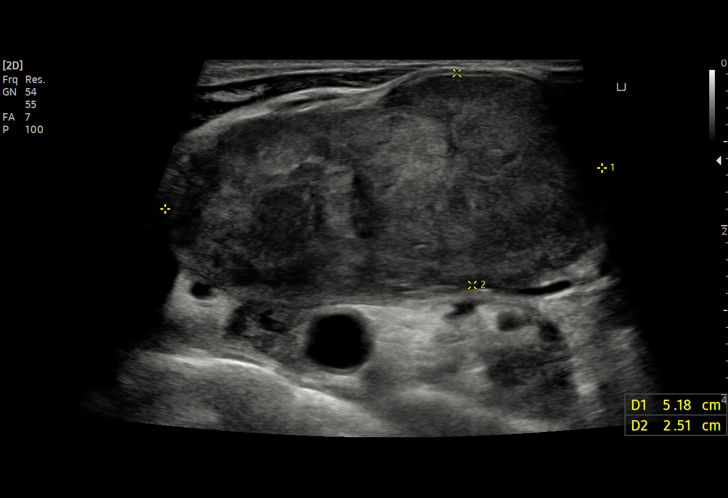
[im 4/14]
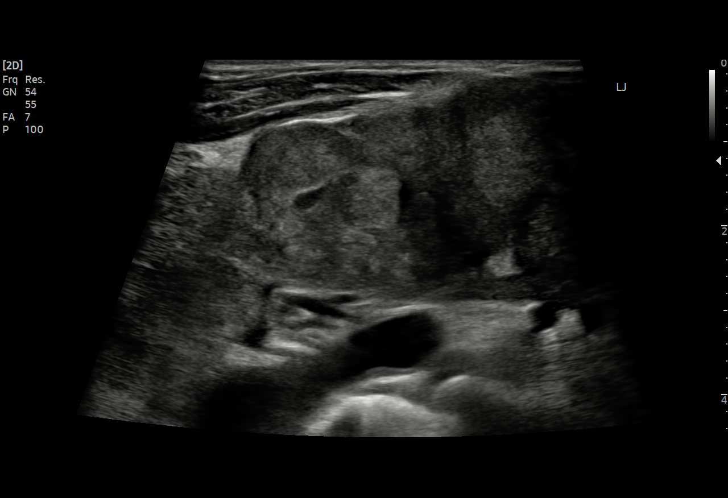
[im 5/14]
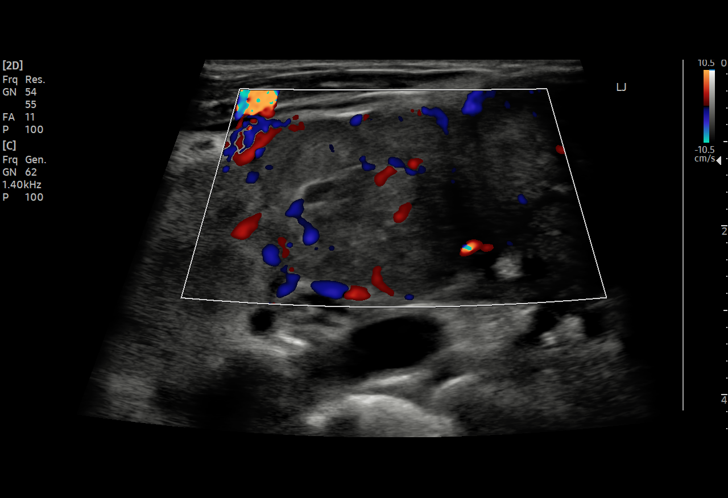
[im 6/14]
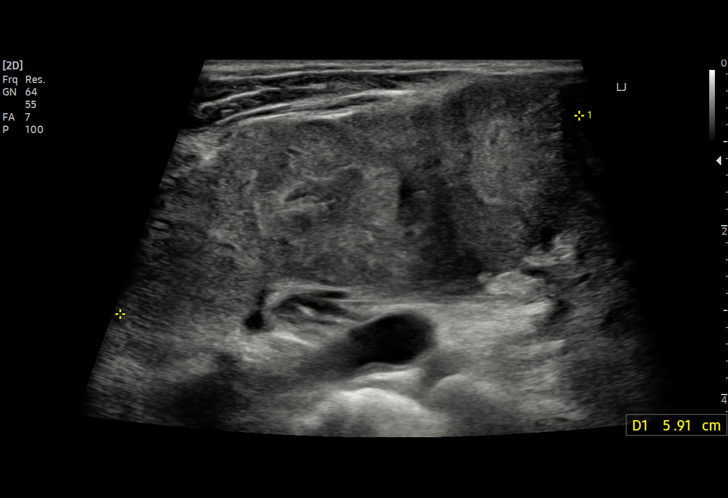
[im 7/14]
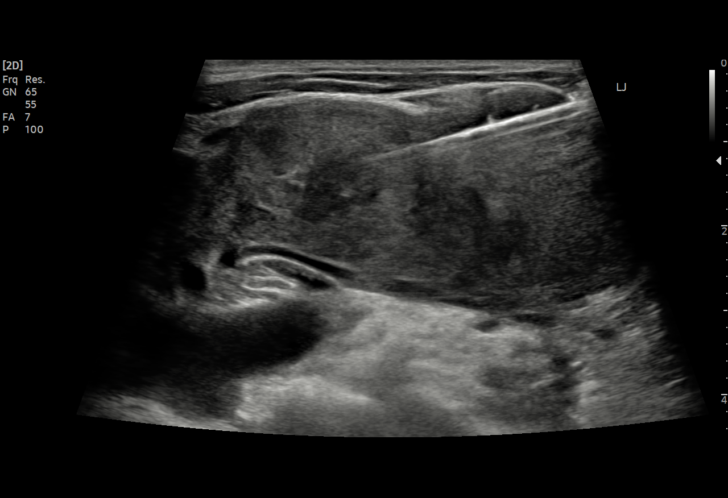
[im 8/14]
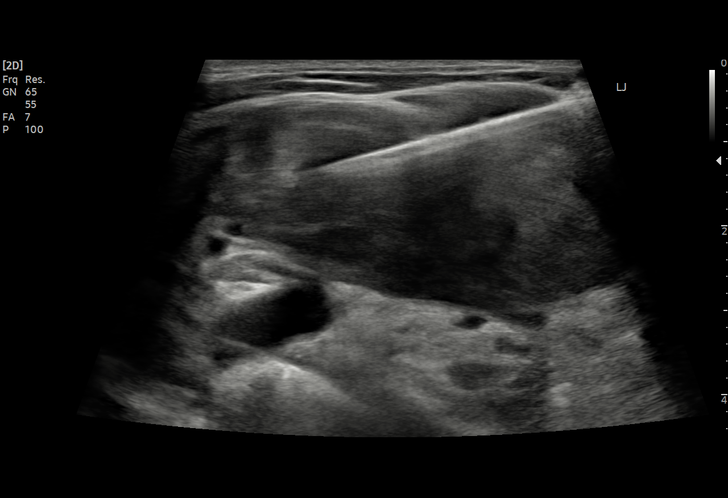
[im 9/14]
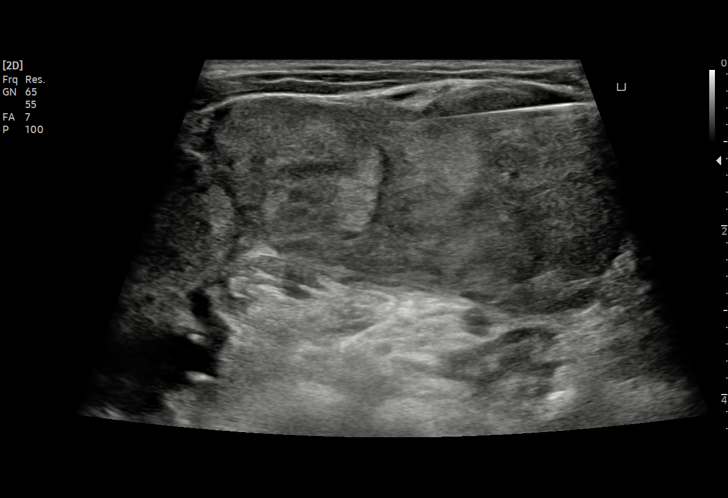
[im 10/14]
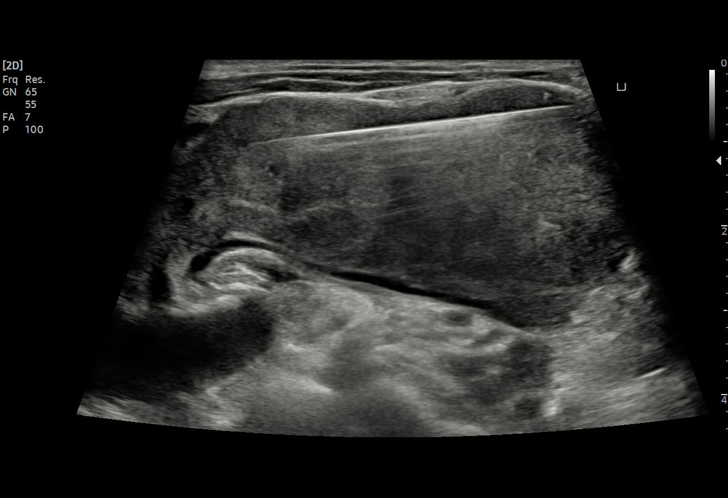
[im 11/14]
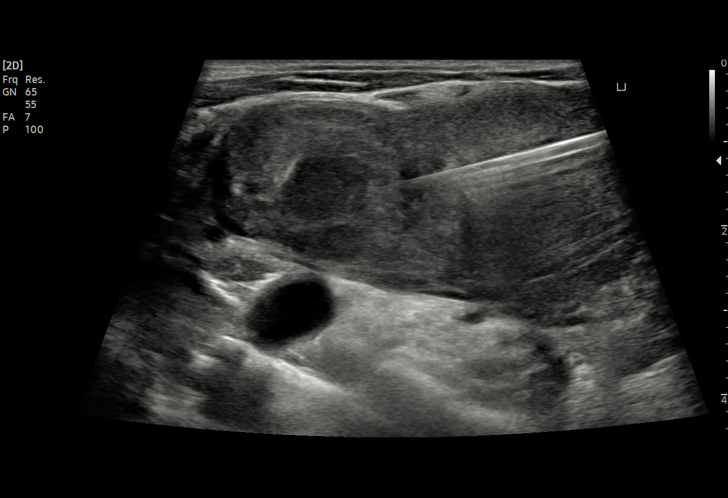
[im 12/14]
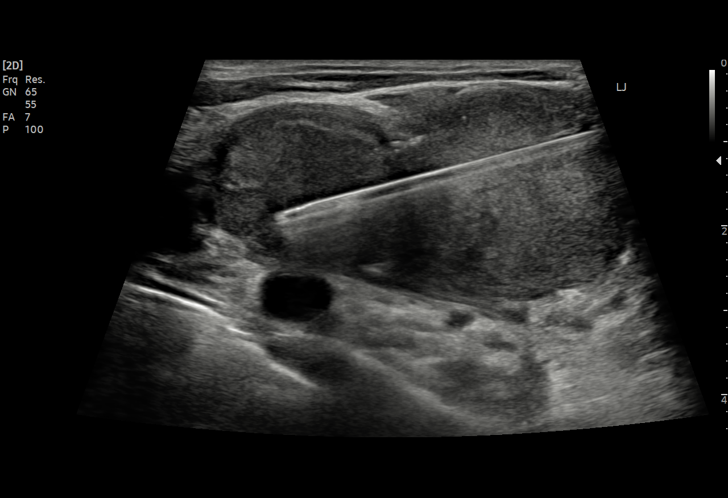
[im 13/14]
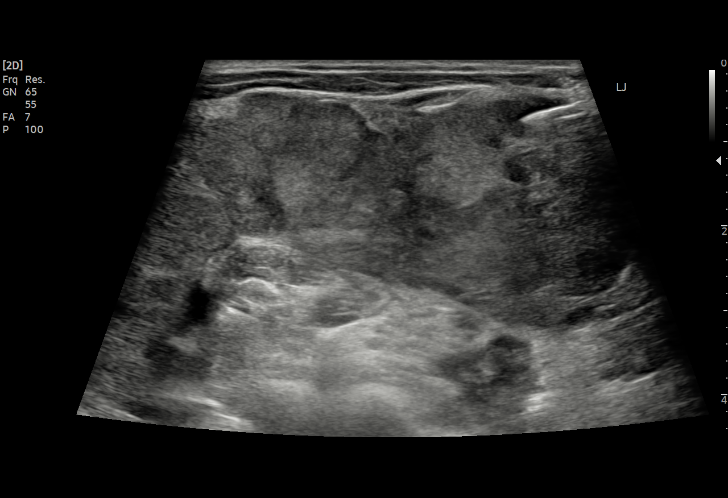
[im 14/14]
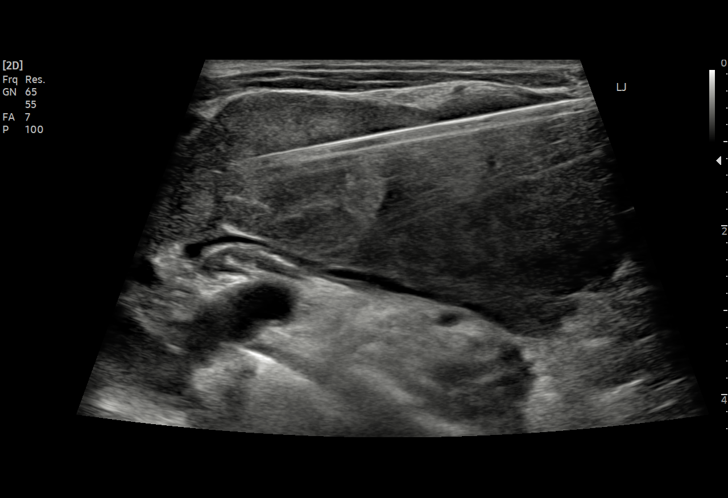

[14 of 14 positions shown; findings below may reference images not displayed]

EXAM:
ULTRASOUND GUIDED CORE BIOPSY OF LEFT SUPRACLAVICULAR LYMPH NODE

MEDICATIONS:
None.

ANESTHESIA/SEDATION:
Fentanyl 50 mcg IV; Versed 1.0 mg IV

Moderate Sedation Time:  10 minutes.

The patient was continuously monitored during the procedure by the
interventional radiology nurse under my direct supervision.

PROCEDURE:
The procedure, risks, benefits, and alternatives were explained to
the patient. Questions regarding the procedure were encouraged and
answered. The patient understands and consents to the procedure. A
time-out was performed prior to initiating the procedure.

The left neck was prepped with chlorhexidine in a sterile fashion,
and a sterile drape was applied covering the operative field. A
sterile gown and sterile gloves were used for the procedure. Local
anesthesia was provided with 1% Lidocaine.

After localizing abnormal enlarged left lower neck/supraclavicular
lymph nodes, core biopsy samples were obtained with an 18 gauge core
biopsy device. Four samples were obtained and submitted in formalin.

COMPLICATIONS:
None immediate.
FINDINGS: Enlarged lymph node mass in the left supraclavicular region measures
at least 5.2 cm in greatest diameter. Solid tissue was obtained.
IMPRESSION: Ultrasound-guided core biopsy performed of enlarged lymph node mass
in the left supraclavicular region.
